# Patient Record
Sex: Male | Born: 1945
Health system: Southern US, Community
[De-identification: ages and names within clinical notes are randomized; demographics above are authoritative.]

## PROBLEM LIST (undated history)

## (undated) DIAGNOSIS — K220 Achalasia of cardia: Secondary | ICD-10-CM

## (undated) DIAGNOSIS — R0600 Dyspnea, unspecified: Secondary | ICD-10-CM

## (undated) DIAGNOSIS — E119 Type 2 diabetes mellitus without complications: Secondary | ICD-10-CM

## (undated) DIAGNOSIS — I4891 Unspecified atrial fibrillation: Secondary | ICD-10-CM

## (undated) DIAGNOSIS — D649 Anemia, unspecified: Secondary | ICD-10-CM

## (undated) DIAGNOSIS — R011 Cardiac murmur, unspecified: Secondary | ICD-10-CM

## (undated) DIAGNOSIS — C4490 Unspecified malignant neoplasm of skin, unspecified: Secondary | ICD-10-CM

## (undated) DIAGNOSIS — I10 Essential (primary) hypertension: Secondary | ICD-10-CM

## (undated) DIAGNOSIS — Z972 Presence of dental prosthetic device (complete) (partial): Secondary | ICD-10-CM

## (undated) DIAGNOSIS — I719 Aortic aneurysm of unspecified site, without rupture: Secondary | ICD-10-CM

## (undated) DIAGNOSIS — E7800 Pure hypercholesterolemia, unspecified: Secondary | ICD-10-CM

## (undated) DIAGNOSIS — N200 Calculus of kidney: Secondary | ICD-10-CM

## (undated) DIAGNOSIS — K219 Gastro-esophageal reflux disease without esophagitis: Secondary | ICD-10-CM

## (undated) DIAGNOSIS — I209 Angina pectoris, unspecified: Secondary | ICD-10-CM

## (undated) DIAGNOSIS — I2510 Atherosclerotic heart disease of native coronary artery without angina pectoris: Secondary | ICD-10-CM

## (undated) DIAGNOSIS — G4730 Sleep apnea, unspecified: Secondary | ICD-10-CM

## (undated) DIAGNOSIS — J439 Emphysema, unspecified: Secondary | ICD-10-CM

## (undated) DIAGNOSIS — J309 Allergic rhinitis, unspecified: Secondary | ICD-10-CM

## (undated) HISTORY — PX: OTHER SURGICAL HISTORY: SHX169

## (undated) HISTORY — PX: HERNIA REPAIR: SHX51

## (undated) HISTORY — PX: CHOLECYSTECTOMY: SHX55

---

## 1965-12-19 HISTORY — PX: NASAL SEPTUM SURGERY: SHX37

## 2006-08-14 ENCOUNTER — Ambulatory Visit: Payer: Self-pay | Admitting: Internal Medicine

## 2006-08-29 ENCOUNTER — Other Ambulatory Visit: Payer: Self-pay

## 2006-08-31 ENCOUNTER — Ambulatory Visit: Payer: Self-pay | Admitting: General Surgery

## 2006-12-07 ENCOUNTER — Ambulatory Visit: Payer: Self-pay | Admitting: Unknown Physician Specialty

## 2007-12-21 ENCOUNTER — Ambulatory Visit: Payer: Self-pay | Admitting: General Surgery

## 2009-12-15 ENCOUNTER — Ambulatory Visit: Payer: Self-pay | Admitting: Urology

## 2010-12-29 ENCOUNTER — Ambulatory Visit: Payer: Self-pay | Admitting: Urology

## 2011-01-01 ENCOUNTER — Inpatient Hospital Stay: Payer: Self-pay | Admitting: Internal Medicine

## 2011-01-03 HISTORY — PX: CORONARY ANGIOPLASTY WITH STENT PLACEMENT: SHX49

## 2013-01-11 ENCOUNTER — Emergency Department: Payer: Self-pay | Admitting: Emergency Medicine

## 2013-01-11 LAB — COMPREHENSIVE METABOLIC PANEL
Alkaline Phosphatase: 139 U/L — ABNORMAL HIGH (ref 50–136)
Anion Gap: 6 — ABNORMAL LOW (ref 7–16)
BUN: 14 mg/dL (ref 7–18)
Bilirubin,Total: 0.5 mg/dL (ref 0.2–1.0)
Creatinine: 1.06 mg/dL (ref 0.60–1.30)
EGFR (African American): >60
Potassium: 3.7 mmol/L (ref 3.5–5.1)
SGPT (ALT): 26 U/L (ref 12–78)
Total Protein: 7.8 g/dL (ref 6.4–8.2)

## 2013-01-11 LAB — CBC
HCT: 50.7 % (ref 40.0–52.0)
HGB: 17.7 g/dL (ref 13.0–18.0)
MCH: 33.6 pg (ref 26.0–34.0)
MCHC: 35 g/dL (ref 32.0–36.0)
Platelet: 129 10*3/uL — ABNORMAL LOW (ref 150–440)
RBC: 5.28 10*6/uL (ref 4.40–5.90)

## 2013-01-11 LAB — TROPONIN I: Troponin-I: <0.02 ng/mL

## 2013-01-12 ENCOUNTER — Emergency Department: Payer: Self-pay | Admitting: Unknown Physician Specialty

## 2013-01-12 LAB — URINALYSIS, COMPLETE
Bacteria: NONE SEEN
Glucose,UR: 50 mg/dL (ref 0–75)
Ketone: NEGATIVE
Nitrite: NEGATIVE
Protein: NEGATIVE
Renal Epithelial: <1
Specific Gravity: 1.009 (ref 1.003–1.030)
Squamous Epithelial: NONE SEEN

## 2013-02-13 ENCOUNTER — Ambulatory Visit: Payer: Self-pay | Admitting: Internal Medicine

## 2013-02-19 ENCOUNTER — Ambulatory Visit: Payer: Self-pay | Admitting: Internal Medicine

## 2014-10-20 DIAGNOSIS — G4733 Obstructive sleep apnea (adult) (pediatric): Secondary | ICD-10-CM | POA: Insufficient documentation

## 2014-10-21 DIAGNOSIS — K219 Gastro-esophageal reflux disease without esophagitis: Secondary | ICD-10-CM | POA: Insufficient documentation

## 2014-10-21 DIAGNOSIS — E119 Type 2 diabetes mellitus without complications: Secondary | ICD-10-CM | POA: Insufficient documentation

## 2015-07-15 ENCOUNTER — Emergency Department: Payer: Commercial Managed Care - HMO

## 2015-07-15 ENCOUNTER — Emergency Department
Admission: EM | Admit: 2015-07-15 | Discharge: 2015-07-15 | Disposition: A | Discharge status: Home / Self Care | Payer: Commercial Managed Care - HMO | Attending: Emergency Medicine | Admitting: Emergency Medicine

## 2015-07-15 ENCOUNTER — Other Ambulatory Visit: Payer: Self-pay

## 2015-07-15 ENCOUNTER — Encounter: Payer: Self-pay | Admitting: Emergency Medicine

## 2015-07-15 DIAGNOSIS — R079 Chest pain, unspecified: Secondary | ICD-10-CM

## 2015-07-15 DIAGNOSIS — I10 Essential (primary) hypertension: Secondary | ICD-10-CM | POA: Diagnosis not present

## 2015-07-15 DIAGNOSIS — E119 Type 2 diabetes mellitus without complications: Secondary | ICD-10-CM | POA: Insufficient documentation

## 2015-07-15 DIAGNOSIS — Z720 Tobacco use: Secondary | ICD-10-CM | POA: Insufficient documentation

## 2015-07-15 DIAGNOSIS — R0789 Other chest pain: Secondary | ICD-10-CM | POA: Diagnosis not present

## 2015-07-15 HISTORY — DX: Unspecified atrial fibrillation: I48.91

## 2015-07-15 HISTORY — DX: Type 2 diabetes mellitus without complications: E11.9

## 2015-07-15 HISTORY — DX: Essential (primary) hypertension: I10

## 2015-07-15 LAB — CBC
HCT: 47.2 % (ref 40.0–52.0)
Hemoglobin: 16.1 g/dL (ref 13.0–18.0)
MCH: 31.8 pg (ref 26.0–34.0)
MCHC: 34.1 g/dL (ref 32.0–36.0)
MCV: 93.4 fL (ref 80.0–100.0)
Platelets: 138 10*3/uL — ABNORMAL LOW (ref 150–440)
RBC: 5.05 MIL/uL (ref 4.40–5.90)
RDW: 13.7 % (ref 11.5–14.5)
WBC: 8.6 10*3/uL (ref 3.8–10.6)

## 2015-07-15 LAB — COMPREHENSIVE METABOLIC PANEL
ALBUMIN: 4.2 g/dL (ref 3.5–5.0)
ALK PHOS: 98 U/L (ref 38–126)
ALT: 18 U/L (ref 17–63)
AST: 27 U/L (ref 15–41)
Anion gap: 8 (ref 5–15)
BUN: 11 mg/dL (ref 6–20)
CALCIUM: 9.7 mg/dL (ref 8.9–10.3)
CO2: 28 mmol/L (ref 22–32)
CREATININE: 0.96 mg/dL (ref 0.61–1.24)
Chloride: 103 mmol/L (ref 101–111)
GFR calc non Af Amer: >60 mL/min (ref >60)
Glucose, Bld: 166 mg/dL — ABNORMAL HIGH (ref 65–99)
POTASSIUM: 3.9 mmol/L (ref 3.5–5.1)
SODIUM: 139 mmol/L (ref 135–145)
Total Bilirubin: 0.6 mg/dL (ref 0.3–1.2)
Total Protein: 7.1 g/dL (ref 6.5–8.1)

## 2015-07-15 LAB — TROPONIN I: Troponin I: <0.03 ng/mL (ref <0.031)

## 2015-07-15 NOTE — ED Provider Notes (Signed)
[facility] Emergency Department Provider Note     Time seen: ----------------------------------------- 3:20 PM on 07/15/2015 -----------------------------------------    I have reviewed the triage vital signs and the nursing notes.   HISTORY  Chief Complaint Chest Pain    HPI Bryan Strong is a 69 y.o. male who presents to ER for intermittent left chest pain for 3 days. Patient states pain is improved at present. Describes as a pressure, nothing made it worse, passing gas seemed to improve the pain. He went to [place] and they sent him here for evaluation. Denied sweats nausea or shortness of breath. Did not feel this way when he received a stent about 6 years ago.   Past Medical History  Diagnosis Date  . Hypertension   . Diabetes mellitus without complication   . A-fib     There are no active problems to display for this patient.   Past Surgical History  Procedure Laterality Date  . Cardiac surgery    . Coronary angioplasty with stent placement      Allergies Other  Social History History  Substance Use Topics  . Smoking status: Current Every Day Smoker    Types: Cigarettes  . Smokeless tobacco: Current User  . Alcohol Use: No    Review of Systems Constitutional: Negative for fever. Eyes: Negative for visual changes. ENT: Negative for sore throat. Cardiovascular: Positive for chest pressure Respiratory: Negative for shortness of breath. Gastrointestinal: Negative for abdominal pain, vomiting and diarrhea. Genitourinary: Negative for dysuria. Musculoskeletal: Negative for back pain. Skin: Negative for rash. Neurological: Negative for headaches, focal weakness or numbness.  10-point ROS otherwise negative.  ____________________________________________   PHYSICAL EXAM:  VITAL SIGNS: ED Triage Vitals  Enc Vitals Group     BP 07/15/15 1354 168/84 mmHg     Pulse Rate 07/15/15 1354 81     Resp 07/15/15 1354 18     Temp  07/15/15 1354 98.5 F (36.9 C)     Temp Source 07/15/15 1354 Oral     SpO2 07/15/15 1354 97 %     Weight 07/15/15 1354 195 lb (88.451 kg)     Height 07/15/15 1354 5\' 10"  (1.778 m)     Head Cir --      Peak Flow --      Pain Score 07/15/15 1355 0     Pain Loc --      Pain Edu? --      Excl. in [place]? --     Constitutional: Alert and oriented. Well appearing and in no distress. Eyes: Conjunctivae are normal. PERRL. Normal extraocular movements. ENT   Head: Normocephalic and atraumatic.   Nose: No congestion/rhinnorhea.   Mouth/Throat: Mucous membranes are moist.   Neck: No stridor. Cardiovascular: Normal rate, regular rhythm. Normal and symmetric distal pulses are present in all extremities. No murmurs, rubs, or gallops. Respiratory: Normal respiratory effort without tachypnea nor retractions. Breath sounds are clear and equal bilaterally. No wheezes/rales/rhonchi. Gastrointestinal: Soft and nontender. No distention. No abdominal bruits. Musculoskeletal: Nontender with normal range of motion in all extremities. No joint effusions.  No lower extremity tenderness nor edema. Neurologic:  Normal speech and language. No gross focal neurologic deficits are appreciated. Speech is normal. No gait instability. Skin:  Skin is warm, dry and intact. No rash noted. Psychiatric: Mood and affect are normal. Speech and behavior are normal. Patient exhibits appropriate insight and judgment. ____________________________________________  EKG: Interpreted by me. Sinus rhythm with normal axis normal intervals. No evidence of  hypertrophy or acute infarction. Rate is 85 bpm  ____________________________________________  ED COURSE:  Pertinent labs & imaging results that were available during my care of the patient were reviewed by me and considered in my medical decision making (see chart for details).  ____________________________________________    LABS (pertinent  positives/negatives)  Labs Reviewed  CBC - Abnormal; Notable for the following:    Platelets 138 (*)    All other components within normal limits  COMPREHENSIVE METABOLIC PANEL - Abnormal; Notable for the following:    Glucose, Bld 166 (*)    All other components within normal limits  TROPONIN I  TROPONIN I    RADIOLOGY Images were viewed by me  Chest x-ray is unremarkable  ____________________________________________  FINAL ASSESSMENT AND PLAN  Chest pressure  Plan: Patient with labs and imaging as dictated above. Troponin is negative 2, we'll referred to cardiology for follow-up.   Earleen Newport, MD   Earleen Newport, MD 07/15/15 [phone]

## 2015-07-15 NOTE — ED Notes (Signed)
Pt from [facility] , x3 days of chest pain / left arm pain

## 2015-07-15 NOTE — Discharge Instructions (Signed)

## 2015-07-15 NOTE — ED Notes (Signed)
C/o intermittent left sided cp x 3 days, pain improved at present

## 2015-07-15 NOTE — ED Notes (Signed)
MD at bedside. 

## 2015-12-29 DIAGNOSIS — I10 Essential (primary) hypertension: Secondary | ICD-10-CM | POA: Diagnosis not present

## 2015-12-29 DIAGNOSIS — H35033 Hypertensive retinopathy, bilateral: Secondary | ICD-10-CM | POA: Diagnosis not present

## 2015-12-29 DIAGNOSIS — H43813 Vitreous degeneration, bilateral: Secondary | ICD-10-CM | POA: Diagnosis not present

## 2015-12-29 DIAGNOSIS — H524 Presbyopia: Secondary | ICD-10-CM | POA: Diagnosis not present

## 2015-12-29 DIAGNOSIS — E119 Type 2 diabetes mellitus without complications: Secondary | ICD-10-CM | POA: Diagnosis not present

## 2015-12-29 DIAGNOSIS — H2513 Age-related nuclear cataract, bilateral: Secondary | ICD-10-CM | POA: Diagnosis not present

## 2015-12-29 DIAGNOSIS — H5213 Myopia, bilateral: Secondary | ICD-10-CM | POA: Diagnosis not present

## 2015-12-29 DIAGNOSIS — H1045 Other chronic allergic conjunctivitis: Secondary | ICD-10-CM | POA: Diagnosis not present

## 2015-12-29 DIAGNOSIS — H52223 Regular astigmatism, bilateral: Secondary | ICD-10-CM | POA: Diagnosis not present

## 2016-01-15 DIAGNOSIS — H43813 Vitreous degeneration, bilateral: Secondary | ICD-10-CM | POA: Diagnosis not present

## 2016-01-15 DIAGNOSIS — S0500XA Injury of conjunctiva and corneal abrasion without foreign body, unspecified eye, initial encounter: Secondary | ICD-10-CM | POA: Diagnosis not present

## 2016-01-15 DIAGNOSIS — I10 Essential (primary) hypertension: Secondary | ICD-10-CM | POA: Diagnosis not present

## 2016-01-15 DIAGNOSIS — H35033 Hypertensive retinopathy, bilateral: Secondary | ICD-10-CM | POA: Diagnosis not present

## 2016-01-15 DIAGNOSIS — H52223 Regular astigmatism, bilateral: Secondary | ICD-10-CM | POA: Diagnosis not present

## 2016-01-15 DIAGNOSIS — H524 Presbyopia: Secondary | ICD-10-CM | POA: Diagnosis not present

## 2016-01-15 DIAGNOSIS — H5213 Myopia, bilateral: Secondary | ICD-10-CM | POA: Diagnosis not present

## 2016-01-15 DIAGNOSIS — H2513 Age-related nuclear cataract, bilateral: Secondary | ICD-10-CM | POA: Diagnosis not present

## 2016-01-15 DIAGNOSIS — E119 Type 2 diabetes mellitus without complications: Secondary | ICD-10-CM | POA: Diagnosis not present

## 2016-02-01 DIAGNOSIS — G4733 Obstructive sleep apnea (adult) (pediatric): Secondary | ICD-10-CM | POA: Diagnosis not present

## 2016-02-03 DIAGNOSIS — H5713 Ocular pain, bilateral: Secondary | ICD-10-CM | POA: Diagnosis not present

## 2016-02-03 DIAGNOSIS — H16223 Keratoconjunctivitis sicca, not specified as Sjogren's, bilateral: Secondary | ICD-10-CM | POA: Diagnosis not present

## 2016-02-17 DIAGNOSIS — H5713 Ocular pain, bilateral: Secondary | ICD-10-CM | POA: Diagnosis not present

## 2016-02-22 DIAGNOSIS — I77811 Abdominal aortic ectasia: Secondary | ICD-10-CM | POA: Diagnosis not present

## 2016-02-22 DIAGNOSIS — I714 Abdominal aortic aneurysm, without rupture: Secondary | ICD-10-CM | POA: Diagnosis not present

## 2016-02-22 DIAGNOSIS — E119 Type 2 diabetes mellitus without complications: Secondary | ICD-10-CM | POA: Diagnosis not present

## 2016-02-22 DIAGNOSIS — E785 Hyperlipidemia, unspecified: Secondary | ICD-10-CM | POA: Diagnosis not present

## 2016-02-22 DIAGNOSIS — I7300 Raynaud's syndrome without gangrene: Secondary | ICD-10-CM | POA: Diagnosis not present

## 2016-02-22 DIAGNOSIS — I10 Essential (primary) hypertension: Secondary | ICD-10-CM | POA: Diagnosis not present

## 2016-02-22 DIAGNOSIS — I499 Cardiac arrhythmia, unspecified: Secondary | ICD-10-CM | POA: Diagnosis not present

## 2016-02-22 DIAGNOSIS — E669 Obesity, unspecified: Secondary | ICD-10-CM | POA: Diagnosis not present

## 2016-03-03 DIAGNOSIS — H0259 Other disorders affecting eyelid function: Secondary | ICD-10-CM | POA: Diagnosis not present

## 2016-03-07 DIAGNOSIS — L718 Other rosacea: Secondary | ICD-10-CM | POA: Diagnosis not present

## 2016-03-07 DIAGNOSIS — H04223 Epiphora due to insufficient drainage, bilateral lacrimal glands: Secondary | ICD-10-CM | POA: Diagnosis not present

## 2016-03-07 DIAGNOSIS — H0289 Other specified disorders of eyelid: Secondary | ICD-10-CM | POA: Diagnosis not present

## 2016-03-24 DIAGNOSIS — H2513 Age-related nuclear cataract, bilateral: Secondary | ICD-10-CM | POA: Diagnosis not present

## 2016-03-24 DIAGNOSIS — H02102 Unspecified ectropion of right lower eyelid: Secondary | ICD-10-CM | POA: Diagnosis not present

## 2016-03-24 DIAGNOSIS — H04123 Dry eye syndrome of bilateral lacrimal glands: Secondary | ICD-10-CM | POA: Diagnosis not present

## 2016-03-24 DIAGNOSIS — I10 Essential (primary) hypertension: Secondary | ICD-10-CM | POA: Diagnosis not present

## 2016-03-24 DIAGNOSIS — E119 Type 2 diabetes mellitus without complications: Secondary | ICD-10-CM | POA: Diagnosis not present

## 2016-03-24 DIAGNOSIS — H524 Presbyopia: Secondary | ICD-10-CM | POA: Diagnosis not present

## 2016-03-24 DIAGNOSIS — H5213 Myopia, bilateral: Secondary | ICD-10-CM | POA: Diagnosis not present

## 2016-03-24 DIAGNOSIS — H43813 Vitreous degeneration, bilateral: Secondary | ICD-10-CM | POA: Diagnosis not present

## 2016-03-24 DIAGNOSIS — H52223 Regular astigmatism, bilateral: Secondary | ICD-10-CM | POA: Diagnosis not present

## 2016-03-24 DIAGNOSIS — H02105 Unspecified ectropion of left lower eyelid: Secondary | ICD-10-CM | POA: Diagnosis not present

## 2016-04-07 DIAGNOSIS — E119 Type 2 diabetes mellitus without complications: Secondary | ICD-10-CM | POA: Diagnosis not present

## 2016-04-07 DIAGNOSIS — B351 Tinea unguium: Secondary | ICD-10-CM | POA: Diagnosis not present

## 2016-04-14 DIAGNOSIS — H02132 Senile ectropion of right lower eyelid: Secondary | ICD-10-CM | POA: Diagnosis not present

## 2016-04-14 DIAGNOSIS — H02135 Senile ectropion of left lower eyelid: Secondary | ICD-10-CM | POA: Diagnosis not present

## 2016-04-27 ENCOUNTER — Encounter: Payer: Self-pay | Admitting: *Deleted

## 2016-04-28 NOTE — Discharge Instructions (Signed)
INSTRUCTIONS FOLLOWING OCULOPLASTIC SURGERY °AMY M. FOWLER, MD ° °AFTER YOUR EYE SURGERY, THER ARE MANY THINGS THWIHC YOU, THE PATIENT, CAN DO TO ASSURE THE BEST POSSIBLE RESULT FROM YOUR OPERATION.  THIS SHEET SHOULD BE REFERRED TO WHENEVER QUESTIONS ARISE.  IF THERE ARE ANY QUESTIONS NOT ANSWERED HERE, DO NOT HESITATE TO CALL OUR OFFICE AT [phone] OR [phone].  THERE IS ALWAYS [facility] AVAILABLE TO CALL IF QUESTIONS OR PROBLEMS ARISE. ° °VISION: Your vision may be blurred and out of focus after surgery until you are able to stop using your ointment, swelling resolves and your eye(s) heal. This may take 1 to 2 weeks at the least.  If your vision becomes gradually more dim or dark, this is not normal and you need to call our office immediately. ° °EYE CARE: For the first 48 hours after surgery, use ice packs frequently - “20 minutes on, 20 minutes off” - to help reduce swelling and bruising.  Small bags of frozen peas or corn make good ice packs along with cloths soaked in ice water.  If you are wearing a patch or other type of dressing following surgery, keep this on for the amount of time specified by your doctor.  For the first week following surgery, you will need to treat your stitches with great care.  If is OK to shower, but take care to not allow soapy water to run into your eye(s) to help reduce changes of infection.  You may gently clean the eyelashes and around the eye(s) with cotton balls and sterile water, BUT DO NOT RUB THE STITCHES VIGOROUSLY.  Keeping your stitches moist with ointment will help promote healing with minimal scar formation. ° °ACTIVITY: When you leave the surgery center, you should go home, rest and be inactive.  The eye(s) may feel scratchy and keeping the eyes closed will allow for faster healing.  The first week following surgery, avoid straining (anything making the face turn red) or lifting over 20 pounds.  Additionally, avoid bending which causes your head to go below  your waist.  Using your eyes will NOT harm them, so feel free to read, watch television, use the computer, etc as desired.  Driving depends on each individual, so check with your doctor if you have questions about driving. ° °MEDICATIONS:  You will be given a prescription for an ointment to use 4 times a day on your stitches.  You can use the ointment in your eyes if they feel scratchy or irritated.  If you eyelid(s) Yama’t close completely when you sleep, put some ointment in your eyes before bedtime. ° °EMERGENCY: If you experience SEVERE EYE PAIN OR HEADACHE UNRELIEVED BY TYLENOL OR PERCOCET, NAUSEA OR VOMITING, WORSENING REDNESS, OR WORSENING VISION (ESPECIALLY VISION THAT WA INITIALLY BETTER) CALL [phone] OR [phone] DURING BUSINESS HOURS OR AFTER HOURS. ° °General Anesthesia, Adult, Care After °Refer to this sheet in the next few weeks. These instructions provide you with information on caring for yourself after your procedure. Your health care provider may also give you more specific instructions. Your treatment has been planned according to current medical practices, but problems sometimes occur. Call your health care provider if you have any problems or questions after your procedure. °WHAT TO EXPECT AFTER THE PROCEDURE °After the procedure, it is typical to experience: °· Sleepiness. °· Nausea and vomiting. °HOME CARE INSTRUCTIONS °· For the first 24 hours after general anesthesia: °¨ Have a responsible person with you. °¨ Do not drive a car. If you   are alone, do not take public transportation. °¨ Do not drink alcohol. °¨ Do not take medicine that has not been prescribed by your health care provider. °¨ Do not sign important papers or make important decisions. °¨ You may resume a normal diet and activities as directed by your health care provider. °· Change bandages (dressings) as directed. °· If you have questions or problems that seem related to general anesthesia, call the hospital and ask for  the anesthetist or anesthesiologist on call. °SEEK MEDICAL CARE IF: °· You have nausea and vomiting that continue the day after anesthesia. °· You develop a rash. °SEEK IMMEDIATE MEDICAL CARE IF:  °· You have difficulty breathing. °· You have chest pain. °· You have any allergic problems. °  °This information is not intended to replace advice given to you by your health care provider. Make sure you discuss any questions you have with your health care provider. °  °Document Released: 03/13/2001 Document Revised: 12/26/2014 Document Reviewed: 04/04/2012 °Elsevier Interactive Patient Education ©2016 [employer] ° °

## 2016-05-03 ENCOUNTER — Ambulatory Visit: Payer: PPO | Admitting: Anesthesiology

## 2016-05-03 ENCOUNTER — Ambulatory Visit
Admission: RE | Admit: 2016-05-03 | Discharge: 2016-05-03 | Disposition: A | Discharge status: Home / Self Care | Payer: PPO | Source: Ambulatory Visit | Attending: Ophthalmology | Admitting: Ophthalmology

## 2016-05-03 ENCOUNTER — Encounter
Admission: RE | Disposition: A | Discharge status: Home / Self Care | Payer: Self-pay | Source: Ambulatory Visit | Attending: Ophthalmology

## 2016-05-03 DIAGNOSIS — K219 Gastro-esophageal reflux disease without esophagitis: Secondary | ICD-10-CM | POA: Insufficient documentation

## 2016-05-03 DIAGNOSIS — L728 Other follicular cysts of the skin and subcutaneous tissue: Secondary | ICD-10-CM | POA: Diagnosis not present

## 2016-05-03 DIAGNOSIS — H02821 Cysts of right upper eyelid: Secondary | ICD-10-CM | POA: Diagnosis not present

## 2016-05-03 DIAGNOSIS — I10 Essential (primary) hypertension: Secondary | ICD-10-CM | POA: Diagnosis not present

## 2016-05-03 DIAGNOSIS — I719 Aortic aneurysm of unspecified site, without rupture: Secondary | ICD-10-CM | POA: Diagnosis not present

## 2016-05-03 DIAGNOSIS — Z87891 Personal history of nicotine dependence: Secondary | ICD-10-CM | POA: Diagnosis not present

## 2016-05-03 DIAGNOSIS — I4891 Unspecified atrial fibrillation: Secondary | ICD-10-CM | POA: Diagnosis not present

## 2016-05-03 DIAGNOSIS — R0681 Apnea, not elsewhere classified: Secondary | ICD-10-CM | POA: Diagnosis not present

## 2016-05-03 DIAGNOSIS — L239 Allergic contact dermatitis, unspecified cause: Secondary | ICD-10-CM | POA: Insufficient documentation

## 2016-05-03 DIAGNOSIS — Z85828 Personal history of other malignant neoplasm of skin: Secondary | ICD-10-CM | POA: Insufficient documentation

## 2016-05-03 DIAGNOSIS — E7800 Pure hypercholesterolemia, unspecified: Secondary | ICD-10-CM | POA: Insufficient documentation

## 2016-05-03 DIAGNOSIS — J449 Chronic obstructive pulmonary disease, unspecified: Secondary | ICD-10-CM | POA: Diagnosis not present

## 2016-05-03 DIAGNOSIS — Z9049 Acquired absence of other specified parts of digestive tract: Secondary | ICD-10-CM | POA: Diagnosis not present

## 2016-05-03 DIAGNOSIS — H02102 Unspecified ectropion of right lower eyelid: Secondary | ICD-10-CM | POA: Insufficient documentation

## 2016-05-03 DIAGNOSIS — H02105 Unspecified ectropion of left lower eyelid: Secondary | ICD-10-CM | POA: Diagnosis not present

## 2016-05-03 DIAGNOSIS — H02132 Senile ectropion of right lower eyelid: Secondary | ICD-10-CM | POA: Diagnosis not present

## 2016-05-03 DIAGNOSIS — E119 Type 2 diabetes mellitus without complications: Secondary | ICD-10-CM | POA: Diagnosis not present

## 2016-05-03 DIAGNOSIS — I2510 Atherosclerotic heart disease of native coronary artery without angina pectoris: Secondary | ICD-10-CM | POA: Insufficient documentation

## 2016-05-03 DIAGNOSIS — Z87442 Personal history of urinary calculi: Secondary | ICD-10-CM | POA: Diagnosis not present

## 2016-05-03 DIAGNOSIS — Z955 Presence of coronary angioplasty implant and graft: Secondary | ICD-10-CM | POA: Insufficient documentation

## 2016-05-03 DIAGNOSIS — H01112 Allergic dermatitis of right lower eyelid: Secondary | ICD-10-CM | POA: Diagnosis not present

## 2016-05-03 DIAGNOSIS — H02135 Senile ectropion of left lower eyelid: Secondary | ICD-10-CM | POA: Diagnosis not present

## 2016-05-03 DIAGNOSIS — H01115 Allergic dermatitis of left lower eyelid: Secondary | ICD-10-CM | POA: Diagnosis not present

## 2016-05-03 HISTORY — DX: Gastro-esophageal reflux disease without esophagitis: K21.9

## 2016-05-03 HISTORY — DX: Sleep apnea, unspecified: G47.30

## 2016-05-03 HISTORY — PX: LID LESION EXCISION: SHX5204

## 2016-05-03 HISTORY — DX: Calculus of kidney: N20.0

## 2016-05-03 HISTORY — DX: Pure hypercholesterolemia, unspecified: E78.00

## 2016-05-03 HISTORY — DX: Unspecified malignant neoplasm of skin, unspecified: C44.90

## 2016-05-03 HISTORY — DX: Presence of dental prosthetic device (complete) (partial): Z97.2

## 2016-05-03 HISTORY — DX: Emphysema, unspecified: J43.9

## 2016-05-03 HISTORY — PX: ECTROPION REPAIR: SHX357

## 2016-05-03 HISTORY — DX: Aortic aneurysm of unspecified site, without rupture: I71.9

## 2016-05-03 LAB — GLUCOSE, CAPILLARY
GLUCOSE-CAPILLARY: 176 mg/dL — AB (ref 65–99)
Glucose-Capillary: 162 mg/dL — ABNORMAL HIGH (ref 65–99)

## 2016-05-03 SURGERY — REPAIR, ECTROPION, EYELID
Anesthesia: Monitor Anesthesia Care | Site: Eye | Laterality: Bilateral | Wound class: Clean

## 2016-05-03 MED ORDER — LACTATED RINGERS IV SOLN
INTRAVENOUS | Status: DC
  Administered 2016-05-03 (×2): via INTRAVENOUS

## 2016-05-03 MED ORDER — BSS IO SOLN
INTRAOCULAR | Status: DC | PRN
  Administered 2016-05-03: 15 mL via INTRAOCULAR

## 2016-05-03 MED ORDER — OXYCODONE-ACETAMINOPHEN 5-325 MG PO TABS: 1.0000 | ORAL_TABLET | ORAL | Status: DC | PRN

## 2016-05-03 MED ORDER — NEOMYCIN-POLYMYXIN-DEXAMETH 3.5-10000-0.1 OP OINT
TOPICAL_OINTMENT | OPHTHALMIC | Status: DC | PRN
  Administered 2016-05-03: 1 via OPHTHALMIC

## 2016-05-03 MED ORDER — PROPOFOL 500 MG/50ML IV EMUL
INTRAVENOUS | Status: DC | PRN
  Administered 2016-05-03: 30 ug/kg/min via INTRAVENOUS

## 2016-05-03 MED ORDER — TETRACAINE HCL 0.5 % OP SOLN
OPHTHALMIC | Status: DC | PRN
  Administered 2016-05-03: 2 [drp] via OPHTHALMIC

## 2016-05-03 MED ORDER — ERYTHROMYCIN 5 MG/GM OP OINT: TOPICAL_OINTMENT | OPHTHALMIC | Status: DC

## 2016-05-03 MED ORDER — ALFENTANIL 500 MCG/ML IJ INJ
INJECTION | INTRAMUSCULAR | Status: DC | PRN
  Administered 2016-05-03: 700 ug via INTRAVENOUS
  Administered 2016-05-03: 300 ug via INTRAVENOUS

## 2016-05-03 MED ORDER — LABETALOL HCL 5 MG/ML IV SOLN
10.0000 mg | Freq: Once | INTRAVENOUS | Status: AC
  Administered 2016-05-03: 10 mg via INTRAVENOUS

## 2016-05-03 MED ORDER — LIDOCAINE-EPINEPHRINE 2 %-1:100000 IJ SOLN
INTRAMUSCULAR | Status: DC | PRN
  Administered 2016-05-03: 4 mL via OPHTHALMIC

## 2016-05-03 SURGICAL SUPPLY — 36 items
APPLICATOR COTTON TIP WD 3 STR (MISCELLANEOUS) ×8 IMPLANT
BLADE SURG 15 STRL LF DISP TIS (BLADE) ×2 IMPLANT
BLADE SURG 15 STRL SS (BLADE) ×2
CORD BIP STRL DISP 12FT (MISCELLANEOUS) ×4 IMPLANT
DRAPE HEAD BAR (DRAPES) ×4 IMPLANT
GAUZE SPONGE 4X4 12PLY STRL (GAUZE/BANDAGES/DRESSINGS) ×4 IMPLANT
GAUZE SPONGE NON-WVN 2X2 STRL (MISCELLANEOUS) ×20 IMPLANT
GLOVE SURG LX 7.0 MICRO (GLOVE) ×4
GLOVE SURG LX STRL 7.0 MICRO (GLOVE) ×4 IMPLANT
MARKER SKIN XFINE TIP W/RULER (MISCELLANEOUS) ×4 IMPLANT
NEEDLE FILTER BLUNT 18X 1/2SAF (NEEDLE) ×2
NEEDLE FILTER BLUNT 18X1 1/2 (NEEDLE) ×2 IMPLANT
NEEDLE HYPO 30X.5 LL (NEEDLE) ×8 IMPLANT
PACK DRAPE NASAL/ENT (PACKS) ×4 IMPLANT
SOL PREP PVP 2OZ (MISCELLANEOUS) ×4
SOLUTION PREP PVP 2OZ (MISCELLANEOUS) ×2 IMPLANT
SPONGE VERSALON 2X2 STRL (MISCELLANEOUS) ×20
SUT CHROMIC 4-0 (SUTURE)
SUT CHROMIC 4-0 M2 12X2 ARM (SUTURE)
SUT CHROMIC 5 0 P 3 (SUTURE) IMPLANT
SUT ETHILON 4 0 CL P 3 (SUTURE) IMPLANT
SUT MERSILENE 4-0 S-2 (SUTURE) ×8 IMPLANT
SUT PDS AB 4-0 P3 18 (SUTURE) IMPLANT
SUT PLAIN GUT (SUTURE) ×4 IMPLANT
SUT PROLENE 5 0 P 3 (SUTURE) ×4 IMPLANT
SUT PROLENE 6 0 P 1 18 (SUTURE) IMPLANT
SUT SILK 4 0 G 3 (SUTURE) IMPLANT
SUT VIC AB 5-0 P-3 18X BRD (SUTURE) IMPLANT
SUT VIC AB 5-0 P3 18 (SUTURE)
SUT VICRYL 6-0  S14 CTD (SUTURE)
SUT VICRYL 6-0 S14 CTD (SUTURE) IMPLANT
SUT VICRYL 7 0 TG140 8 (SUTURE) IMPLANT
SUTURE CHRMC 4-0 M2 12X2 ARM (SUTURE) IMPLANT
SYR 3ML LL SCALE MARK (SYRINGE) ×4 IMPLANT
SYRINGE 10CC LL (SYRINGE) ×4 IMPLANT
WATER STERILE IRR 500ML POUR (IV SOLUTION) ×4 IMPLANT

## 2016-05-03 NOTE — Anesthesia Preprocedure Evaluation (Addendum)
Anesthesia Evaluation  Patient identified by MRN, date of birth, ID band Patient awake    Reviewed: Allergy & Precautions, NPO status , Patient's Chart, lab work & pertinent test results  Airway Mallampati: I  TM Distance: >3 FB Neck ROM: Full    Dental  (+) Upper Dentures, Lower Dentures   Pulmonary sleep apnea and Continuous Positive Airway Pressure Ventilation , COPD, former smoker,    Pulmonary exam normal        Cardiovascular hypertension, + CAD and + Cardiac Stents  Normal cardiovascular exam+ dysrhythmias Atrial Fibrillation      Neuro/Psych negative neurological ROS  negative psych ROS   GI/Hepatic GERD  Medicated and Controlled,  Endo/Other  diabetes, Oral Hypoglycemic Agents  Renal/GU Renal disease     Musculoskeletal negative musculoskeletal ROS (+)   Abdominal   Peds  Hematology negative hematology ROS (+)   Anesthesia Other Findings   Reproductive/Obstetrics negative OB ROS                            Anesthesia Physical Anesthesia Plan  ASA: III  Anesthesia Plan: MAC   Post-op Pain Management:    Induction: Intravenous  Airway Management Planned:   Additional Equipment:   Intra-op Plan:   Post-operative Plan:   Informed Consent: I have reviewed the patients History and Physical, chart, labs and discussed the procedure including the risks, benefits and alternatives for the proposed anesthesia with the patient or authorized representative who has indicated his/her understanding and acceptance.     Plan Discussed with: CRNA  Anesthesia Plan Comments:         Anesthesia Quick Evaluation

## 2016-05-03 NOTE — Interval H&P Note (Signed)
History and Physical Interval Note:  05/03/2016 9:50 AM  Bryan Strong  has presented today for surgery, with the diagnosis of Alpha H02.132 RIGHT LOWER H02.135 LEFT LOWER ALLERGIC DERMATITIS OF EYELID H01.112 RIGHT LOWER H01.115 LEFT LOWER  The various methods of treatment have been discussed with the patient and family. After consideration of risks, benefits and other options for treatment, the patient has consented to  Procedure(s) with comments: REPAIR OF ECTROPION (Bilateral) BIOPSY EYELIDS LOWER (Bilateral) - Diabetic - oral meds CPAP as a surgical intervention .  The patient's history has been reviewed, patient examined, no change in status, stable for surgery.  I have reviewed the patient's chart and labs.  Questions were answered to the patient's satisfaction.     Vickki Muff, Aliana Kreischer M

## 2016-05-03 NOTE — Op Note (Signed)
Preoperative Diagnosis:   1.  Lower eyelid laxity with ectropion, bilateral  lower eyelid(s). 2.  Lesion right upper eyelid  Postoperative Diagnosis:   Same.  Procedure(s) Performed:  1.  Lateral tarsal strip procedure,  bilateral  lower eyelid(s). 2.  Biopsy right upper eyelid lesion   Teaching Surgeon: Philis Pique. Vickki Muff, M.D.  Assistants: none  Anesthesia: MAC  Specimens: 4 mm yellow domed lesion sent in formalin   Estimated Blood Loss: Minimal.  Complications: None.  Operative Findings: None   Procedure:    Review of patient's allergies indicates no known allergies..    After discussing the risks, benefits, complications, and alternatives with the patient, appropriate informed consent was obtained. The patient was brought to the operating suite and reclined supine. Time out was conducted and the patient was sedated.  Local anesthetic consisting of a 50-50 mixture of 2% lidocaine and 0.75% bupivacaine with added Hylenex was injected subcutaneously to the bilateral lateral canthal region(s) and lower eyelid(s). Additional anesthetic was injected subcutaneously to the right upper eyelid and subconjunctivally to the eye lateral lower eyelid(s). Finally, anesthetic was injected down to the periosteum of the bilateral lateral orbital rim(s).  After adequate local was instilled, the patient was prepped and draped in the usual sterile fashion for eyelid surgery. Attention was turned to the right lateral canthal angle. Westcott scissors were used to create a lateral canthotomy. Hemostasis was obtained with bipolar cautery. An inferior cantholysis was then performed with additional bipolar hemostasis. The anterior and posterior lamella of the lid were divided for approximately 8 mm.  A strip of the epithelium was excised off the superior margin of the tarsal strip and conjunctiva and retractors were incised off the inferior margin of the tarsal strip. A double-armed 4-0 Mersilene suture was then  passed each arm through the terminal portion of the tarsal strip. Each arm of the suture was then passed through the periosteum of the inner portion of the lateral orbital rim at the level of Whitnall's tubercle. The sutures were advanced and this provided nice elevation and tightening of the lower eyelid. Once the suture was secured, a thin strip of follicle-bearing skin was excised. The lateral canthal angle was reformed with an interrupted 6-0 fast absorbing plain suture. Orbicularis was reapproximated with horizontal subcuticular 6-0 fast absorbing plain gut sutures. The skin was closed with interrupted 6-0 fast absorbing plain gut sutures.   Attention was then turned to the opposite eyelid where the same procedure was performed in the same manner.   Attention was turned to the right upper eyelid. An ellipse was incised around the lesion with a #15 blade. The lesion was excised completely with Wescott scissors and placed in formalin. An interrupted 6-0 fast absorbing plain gut suture was placed to reapproximate the skin edges.  The patient tolerated the procedure well. Erythromycin ophthalmic ointment was applied to the incision site(s) followed by ice packs. The patient was taken to the recovery area where he recovered without difficulty.  Post-Op Plan/Instructions:  The patient was instructed to use ice packs frequently for the next 48 hours. He was instructed to use erythromycin ophthalmic ointment on his incisions 4 times a day for the next 12 to 14 days. He was given a prescription for Percocet for pain control should Tylenol not be effective. He was asked to to follow up at the [facility] in [place], [place] in 3-4 weeks' time or sooner as needed for problems.  Teaching Surgeon Attestation: None  Jermichael Belmares M. Vickki Muff, M.D.  Attending,Ophthalmology

## 2016-05-03 NOTE — Transfer of Care (Signed)
Immediate Anesthesia Transfer of Care Note  Patient: Bryan Strong  Procedure(s) Performed: Procedure(s) with comments: REPAIR OF ECTROPION BILATERAL LOWER EYELIDS (Bilateral) ,EXCISION CYST RIGHT UPPER EYELID (Bilateral) - Diabetic - oral meds CPAP  Patient Location: PACU  Anesthesia Type: MAC  Level of Consciousness: awake, alert  and patient cooperative  Airway and Oxygen Therapy: Patient Spontanous Breathing and Patient connected to supplemental oxygen  Post-op Assessment: Post-op Vital signs reviewed, Patient's Cardiovascular Status Stable, Respiratory Function Stable, Patent Airway and No signs of Nausea or vomiting  Post-op Vital Signs: Reviewed and stable  Complications: No apparent anesthesia complications

## 2016-05-03 NOTE — Anesthesia Procedure Notes (Signed)
Procedure Name: MAC Performed by: Dayveon Halley Pre-anesthesia Checklist: Patient identified, Emergency Drugs available, Suction available, Timeout performed and Patient being monitored Patient Re-evaluated:Patient Re-evaluated prior to inductionOxygen Delivery Method: Nasal cannula Placement Confirmation: positive ETCO2     

## 2016-05-03 NOTE — H&P (Signed)
  See the history and physical completed at [facility] on 04/21/2016 and scanned into the chart

## 2016-05-03 NOTE — Anesthesia Postprocedure Evaluation (Signed)
Anesthesia Post Note  Patient: Bryan Strong  Procedure(s) Performed: Procedure(s) (LRB): REPAIR OF ECTROPION BILATERAL LOWER EYELIDS (Bilateral) ,EXCISION CYST RIGHT UPPER EYELID (Bilateral)  Patient location during evaluation: PACU Anesthesia Type: MAC Level of consciousness: awake and alert and oriented Pain management: pain level controlled Vital Signs Assessment: post-procedure vital signs reviewed and stable Respiratory status: spontaneous breathing and nonlabored ventilation Cardiovascular status: stable Postop Assessment: no signs of nausea or vomiting and adequate PO intake Anesthetic complications: no    Estill Batten

## 2016-05-04 ENCOUNTER — Encounter: Payer: Self-pay | Admitting: Ophthalmology

## 2016-05-06 LAB — SURGICAL PATHOLOGY

## 2016-05-19 DIAGNOSIS — J310 Chronic rhinitis: Secondary | ICD-10-CM | POA: Diagnosis not present

## 2016-05-19 DIAGNOSIS — J449 Chronic obstructive pulmonary disease, unspecified: Secondary | ICD-10-CM | POA: Diagnosis not present

## 2016-05-19 DIAGNOSIS — G4733 Obstructive sleep apnea (adult) (pediatric): Secondary | ICD-10-CM | POA: Diagnosis not present

## 2016-05-19 DIAGNOSIS — R0609 Other forms of dyspnea: Secondary | ICD-10-CM | POA: Diagnosis not present

## 2016-05-20 DIAGNOSIS — E119 Type 2 diabetes mellitus without complications: Secondary | ICD-10-CM | POA: Diagnosis not present

## 2016-05-20 DIAGNOSIS — Z79899 Other long term (current) drug therapy: Secondary | ICD-10-CM | POA: Diagnosis not present

## 2016-05-20 DIAGNOSIS — E7800 Pure hypercholesterolemia, unspecified: Secondary | ICD-10-CM | POA: Diagnosis not present

## 2016-05-27 DIAGNOSIS — I2510 Atherosclerotic heart disease of native coronary artery without angina pectoris: Secondary | ICD-10-CM | POA: Insufficient documentation

## 2016-05-27 DIAGNOSIS — Z79899 Other long term (current) drug therapy: Secondary | ICD-10-CM | POA: Diagnosis not present

## 2016-05-27 DIAGNOSIS — K219 Gastro-esophageal reflux disease without esophagitis: Secondary | ICD-10-CM | POA: Diagnosis not present

## 2016-05-27 DIAGNOSIS — I10 Essential (primary) hypertension: Secondary | ICD-10-CM | POA: Diagnosis not present

## 2016-05-27 DIAGNOSIS — J449 Chronic obstructive pulmonary disease, unspecified: Secondary | ICD-10-CM | POA: Diagnosis not present

## 2016-05-27 DIAGNOSIS — Z125 Encounter for screening for malignant neoplasm of prostate: Secondary | ICD-10-CM | POA: Diagnosis not present

## 2016-05-27 DIAGNOSIS — E119 Type 2 diabetes mellitus without complications: Secondary | ICD-10-CM | POA: Diagnosis not present

## 2016-05-27 DIAGNOSIS — E7800 Pure hypercholesterolemia, unspecified: Secondary | ICD-10-CM | POA: Diagnosis not present

## 2016-05-27 DIAGNOSIS — Z9989 Dependence on other enabling machines and devices: Secondary | ICD-10-CM | POA: Diagnosis not present

## 2016-05-27 DIAGNOSIS — G4733 Obstructive sleep apnea (adult) (pediatric): Secondary | ICD-10-CM | POA: Diagnosis not present

## 2016-06-30 DIAGNOSIS — H119 Unspecified disorder of conjunctiva: Secondary | ICD-10-CM | POA: Diagnosis not present

## 2016-07-12 DIAGNOSIS — B351 Tinea unguium: Secondary | ICD-10-CM | POA: Diagnosis not present

## 2016-07-12 DIAGNOSIS — E119 Type 2 diabetes mellitus without complications: Secondary | ICD-10-CM | POA: Diagnosis not present

## 2016-07-28 DIAGNOSIS — H04569 Stenosis of unspecified lacrimal punctum: Secondary | ICD-10-CM | POA: Diagnosis not present

## 2016-09-29 DIAGNOSIS — E119 Type 2 diabetes mellitus without complications: Secondary | ICD-10-CM | POA: Diagnosis not present

## 2016-09-29 DIAGNOSIS — H0289 Other specified disorders of eyelid: Secondary | ICD-10-CM | POA: Diagnosis not present

## 2016-09-29 DIAGNOSIS — H2513 Age-related nuclear cataract, bilateral: Secondary | ICD-10-CM | POA: Diagnosis not present

## 2016-09-29 DIAGNOSIS — H04123 Dry eye syndrome of bilateral lacrimal glands: Secondary | ICD-10-CM | POA: Diagnosis not present

## 2016-09-29 DIAGNOSIS — H52223 Regular astigmatism, bilateral: Secondary | ICD-10-CM | POA: Diagnosis not present

## 2016-09-29 DIAGNOSIS — H5213 Myopia, bilateral: Secondary | ICD-10-CM | POA: Diagnosis not present

## 2016-09-29 DIAGNOSIS — I10 Essential (primary) hypertension: Secondary | ICD-10-CM | POA: Diagnosis not present

## 2016-09-29 DIAGNOSIS — H43813 Vitreous degeneration, bilateral: Secondary | ICD-10-CM | POA: Diagnosis not present

## 2016-10-05 DIAGNOSIS — Z23 Encounter for immunization: Secondary | ICD-10-CM | POA: Diagnosis not present

## 2016-10-13 DIAGNOSIS — H02834 Dermatochalasis of left upper eyelid: Secondary | ICD-10-CM | POA: Diagnosis not present

## 2016-10-13 DIAGNOSIS — H02831 Dermatochalasis of right upper eyelid: Secondary | ICD-10-CM | POA: Diagnosis not present

## 2016-10-18 DIAGNOSIS — B351 Tinea unguium: Secondary | ICD-10-CM | POA: Diagnosis not present

## 2016-10-18 DIAGNOSIS — E119 Type 2 diabetes mellitus without complications: Secondary | ICD-10-CM | POA: Diagnosis not present

## 2016-11-23 DIAGNOSIS — R05 Cough: Secondary | ICD-10-CM | POA: Diagnosis not present

## 2016-11-23 DIAGNOSIS — J310 Chronic rhinitis: Secondary | ICD-10-CM | POA: Diagnosis not present

## 2016-11-23 DIAGNOSIS — J449 Chronic obstructive pulmonary disease, unspecified: Secondary | ICD-10-CM | POA: Diagnosis not present

## 2016-11-23 DIAGNOSIS — G4733 Obstructive sleep apnea (adult) (pediatric): Secondary | ICD-10-CM | POA: Diagnosis not present

## 2016-11-25 DIAGNOSIS — Z79899 Other long term (current) drug therapy: Secondary | ICD-10-CM | POA: Diagnosis not present

## 2016-11-25 DIAGNOSIS — Z125 Encounter for screening for malignant neoplasm of prostate: Secondary | ICD-10-CM | POA: Diagnosis not present

## 2016-11-25 DIAGNOSIS — E119 Type 2 diabetes mellitus without complications: Secondary | ICD-10-CM | POA: Diagnosis not present

## 2016-11-25 DIAGNOSIS — E7800 Pure hypercholesterolemia, unspecified: Secondary | ICD-10-CM | POA: Diagnosis not present

## 2016-12-02 DIAGNOSIS — E119 Type 2 diabetes mellitus without complications: Secondary | ICD-10-CM | POA: Diagnosis not present

## 2016-12-02 DIAGNOSIS — E7800 Pure hypercholesterolemia, unspecified: Secondary | ICD-10-CM | POA: Diagnosis not present

## 2016-12-02 DIAGNOSIS — Z0000 Encounter for general adult medical examination without abnormal findings: Secondary | ICD-10-CM | POA: Diagnosis not present

## 2016-12-02 DIAGNOSIS — Z79899 Other long term (current) drug therapy: Secondary | ICD-10-CM | POA: Diagnosis not present

## 2017-01-05 DIAGNOSIS — G4733 Obstructive sleep apnea (adult) (pediatric): Secondary | ICD-10-CM | POA: Diagnosis not present

## 2017-01-17 DIAGNOSIS — E119 Type 2 diabetes mellitus without complications: Secondary | ICD-10-CM | POA: Diagnosis not present

## 2017-01-17 DIAGNOSIS — B351 Tinea unguium: Secondary | ICD-10-CM | POA: Diagnosis not present

## 2017-01-19 DIAGNOSIS — H02831 Dermatochalasis of right upper eyelid: Secondary | ICD-10-CM | POA: Diagnosis not present

## 2017-01-19 DIAGNOSIS — H02834 Dermatochalasis of left upper eyelid: Secondary | ICD-10-CM | POA: Diagnosis not present

## 2017-02-24 ENCOUNTER — Other Ambulatory Visit (INDEPENDENT_AMBULATORY_CARE_PROVIDER_SITE_OTHER): Payer: Self-pay | Admitting: Vascular Surgery

## 2017-02-24 DIAGNOSIS — I700 Atherosclerosis of aorta: Secondary | ICD-10-CM

## 2017-02-27 ENCOUNTER — Ambulatory Visit (INDEPENDENT_AMBULATORY_CARE_PROVIDER_SITE_OTHER): Payer: PPO | Admitting: Vascular Surgery

## 2017-02-27 ENCOUNTER — Ambulatory Visit (INDEPENDENT_AMBULATORY_CARE_PROVIDER_SITE_OTHER): Payer: PPO

## 2017-02-27 DIAGNOSIS — E7800 Pure hypercholesterolemia, unspecified: Secondary | ICD-10-CM | POA: Diagnosis not present

## 2017-02-27 DIAGNOSIS — I10 Essential (primary) hypertension: Secondary | ICD-10-CM | POA: Diagnosis not present

## 2017-02-27 DIAGNOSIS — E119 Type 2 diabetes mellitus without complications: Secondary | ICD-10-CM | POA: Diagnosis not present

## 2017-02-27 DIAGNOSIS — I714 Abdominal aortic aneurysm, without rupture, unspecified: Secondary | ICD-10-CM

## 2017-02-27 DIAGNOSIS — I152 Hypertension secondary to endocrine disorders: Secondary | ICD-10-CM | POA: Insufficient documentation

## 2017-02-27 DIAGNOSIS — I700 Atherosclerosis of aorta: Secondary | ICD-10-CM | POA: Diagnosis not present

## 2017-02-27 NOTE — Progress Notes (Signed)
MRN : [id number]  Bryan Strong is a 71 y.o. (05/22/1946) male who presents with chief complaint of  Chief Complaint  Patient presents with  . AAA    1 year ultrasound follow up  .  History of Present Illness: The patient returns to the office for surveillance of a known abdominal aortic aneurysm. Patient denies abdominal pain or back pain, no other abdominal complaints. No changes suggesting embolic episodes.   There have been no interval changes in the patient's overall health care since his last visit.  Patient denies amaurosis fugax or TIA symptoms. There is no history of claudication or rest pain symptoms of the lower extremities. The patient denies angina or shortness of breath.   Duplex US of the aorta and iliac arteries shows an AAA measured 3.08 cm with 1.47 cm right iliac artery aneurysm.  Current Meds  Medication Sig  . ARTIFICIAL TEAR OP Apply to eye as needed.  Marland Kitchen ascorbic acid (VITAMIN C) 500 MG tablet Take 500 mg by mouth daily.  Marland Kitchen aspirin 325 MG tablet Take 325 mg by mouth daily.  Marland Kitchen azelastine (ASTELIN) 0.1 % nasal spray Place into both nostrils 2 (two) times daily. Use in each nostril as directed  . buPROPion (ZYBAN) 150 MG 12 hr tablet Take 150 mg by mouth daily.  . clopidogrel (PLAVIX) 75 MG tablet Take 75 mg by mouth daily.  Marland Kitchen doxycycline (MONODOX) 50 MG capsule Take 50 mg by mouth daily.  Marland Kitchen erythromycin [facility]) ophthalmic ointment Use a small amount on your sutures 4 times a day for the next 2 weeks. Switch to Aquaphor ointment should allergy develop.  . fexofenadine (ALLEGRA) 180 MG tablet Take 180 mg by mouth daily.  . Flaxseed, Linseed, (FLAXSEED OIL PO) Take by mouth 2 (two) times daily.  . fluticasone (FLONASE) 50 MCG/ACT nasal spray Place into both nostrils 2 (two) times daily.  Marland Kitchen lisinopril (PRINIVIL,ZESTRIL) 10 MG tablet Take 10 mg by mouth 2 (two) times daily.  . metFORMIN (GLUCOPHAGE) 1000 MG tablet Take 1,000 mg by mouth 2 (two) times daily with a  meal.  . metoprolol succinate (TOPROL-XL) 25 MG 24 hr tablet Take 12.5 mg by mouth 2 (two) times daily.  . Multiple Vitamin (MULTIVITAMIN) capsule Take 1 capsule by mouth daily.  Marland Kitchen oxyCODONE-acetaminophen (PERCOCET) 5-325 MG tablet Take 1 tablet by mouth every 4 (four) hours as needed for severe pain.  . pantoprazole (PROTONIX) 40 MG tablet Take 40 mg by mouth daily.  . simvastatin (ZOCOR) 40 MG tablet Take 40 mg by mouth daily.    Past Medical History:  Diagnosis Date  . A-fib ([place])   . Aortic aneurysm ([place])    followed by Dr. Franchot Gallo  . Diabetes mellitus without complication ([place])   . Emphysema of lung ([place])   . GERD (gastroesophageal reflux disease)   . Hypercholesteremia   . Hypertension   . Kidney stones   . Skin cancer   . Sleep apnea    CPAP  . Wears dentures    full upper and lower    Past Surgical History:  Procedure Laterality Date  . CHOLECYSTECTOMY    . CORONARY ANGIOPLASTY WITH STENT PLACEMENT  01/03/11   [facility]  Dr. Saralyn Pilar  . ECTROPION REPAIR Bilateral 05/03/2016   Procedure: REPAIR OF ECTROPION BILATERAL LOWER EYELIDS;  Surgeon: Karle Starch, MD;  Location: [place];  Service: Ophthalmology;  Laterality: Bilateral;  . HERNIA REPAIR    . LID LESION EXCISION Bilateral 05/03/2016  Procedure: ,EXCISION CYST RIGHT UPPER EYELID;  Surgeon: Karle Starch, MD;  Location: [place];  Service: Ophthalmology;  Laterality: Bilateral;  Diabetic - oral meds CPAP  . NASAL SEPTUM SURGERY  1967    Social History Social History  Substance Use Topics  . Smoking status: Former Smoker    Packs/day: 1.00    Years: 56.00    Types: Cigarettes  . Smokeless tobacco: Former Systems developer    Quit date: 09/19/2015  . Alcohol use No    Family History No family history on file. No family history of bleeding/clotting disorders, porphyria or autoimmune disease   Allergies  Allergen Reactions  . Other Rash    ERYTHROMYCIN OINTMENT "some nerve pill, caused HTN"      REVIEW OF SYSTEMS (Negative unless checked)  Constitutional: [] Weight loss  [] Fever  [] Chills Cardiac: [] Chest pain   [] Chest pressure   [] Palpitations   [] Shortness of breath when laying flat   [] Shortness of breath with exertion. Vascular:  [] Pain in legs with walking   [] Pain in legs at rest  [] History of DVT   [] Phlebitis   [] Swelling in legs   [] Varicose veins   [] Non-healing ulcers Pulmonary:   [] Uses home oxygen   [] Productive cough   [] Hemoptysis   [] Wheeze  [] COPD   [] Asthma Neurologic:  [] Dizziness   [] Seizures   [] History of stroke   [] History of TIA  [] Aphasia   [] Vissual changes   [] Weakness or numbness in arm   [] Weakness or numbness in leg Musculoskeletal:   [] Joint swelling   [] Joint pain   [] Low back pain Hematologic:  [] Easy bruising  [] Easy bleeding   [] Hypercoagulable state   [] Anemic Gastrointestinal:  [] Diarrhea   [] Vomiting  [] Gastroesophageal reflux/heartburn   [] Difficulty swallowing. Genitourinary:  [] Chronic kidney disease   [] Difficult urination  [] Frequent urination   [] Blood in urine Skin:  [] Rashes   [] Ulcers  Psychological:  [] History of anxiety   []  History of major depression.  Physical Examination  Vitals:   02/27/17 0844  BP: (!) 147/87  Pulse: 70  Resp: 16  Weight: 202 lb (91.6 kg)  Height: 5\' 10"  (1.778 m)   Body mass index is 28.98 kg/m. Gen: WD/WN, NAD Head: [place]/AT, No temporalis wasting.  Ear/Nose/Throat: Hearing grossly intact, nares w/o erythema or drainage, poor dentition Eyes: PER, EOMI, sclera nonicteric.  Neck: Supple, no masses.  No bruit or JVD.  Pulmonary:  Good air movement, clear to auscultation bilaterally, no use of accessory muscles.  Cardiac: RRR, normal S1, S2, no Murmurs. Vascular:  Vessel Right Left  Radial Palpable Palpable  Ulnar Palpable Palpable  Brachial Palpable Palpable  Carotid Palpable Palpable  Femoral Palpable Palpable  Popliteal Palpable Palpable  PT Palpable Palpable  DP Palpable Palpable    Gastrointestinal: soft, non-distended. No guarding/no peritoneal signs.  Musculoskeletal: M/S 5/5 throughout.  No deformity or atrophy.  Neurologic: CN 2-12 intact. Pain and light touch intact in extremities.  Symmetrical.  Speech is fluent. Motor exam as listed above. Psychiatric: Judgment intact, Mood & affect appropriate for pt's clinical situation. Dermatologic: No rashes or ulcers noted.  No changes consistent with cellulitis. Lymph : No Cervical lymphadenopathy, no lichenification or skin changes of chronic lymphedema.  CBC Lab Results  Component Value Date   WBC 8.6 07/15/2015   HGB 16.1 07/15/2015   HCT 47.2 07/15/2015   MCV 93.4 07/15/2015   PLT 138 (L) 07/15/2015    BMET    Component Value Date/Time   NA 139 07/15/2015 1401  NA 140 01/11/2013 2241   K 3.9 07/15/2015 1401   K 3.7 01/11/2013 2241   CL 103 07/15/2015 1401   CL 105 01/11/2013 2241   CO2 28 07/15/2015 1401   CO2 29 01/11/2013 2241   GLUCOSE 166 (H) 07/15/2015 1401   GLUCOSE 219 (H) 01/11/2013 2241   BUN 11 07/15/2015 1401   BUN 14 01/11/2013 2241   CREATININE 0.96 07/15/2015 1401   CREATININE 1.06 01/11/2013 2241   CALCIUM 9.7 07/15/2015 1401   CALCIUM 9.4 01/11/2013 2241   GFRNONAA >60 07/15/2015 1401   GFRNONAA >60 01/11/2013 2241   GFRAA >60 07/15/2015 1401   GFRAA >60 01/11/2013 2241   CrCl cannot be calculated (Patient's most recent lab result is older than the maximum 21 days allowed.).  COAG No results found for: INR, PROTIME  Radiology No results found.  Assessment/Plan 1. AAA (abdominal aortic aneurysm) without rupture (HCC) No surgery or intervention at this time.  The patient has an asymptomatic abdominal aortic aneurysm that is less than 4 cm in maximal diameter.  I have discussed the natural history of abdominal aortic aneurysm and the small risk of rupture for aneurysm less than 5 cm in size.  However, as these small aneurysms tend to enlarge over time, continued  surveillance with ultrasound or CT scan is mandatory.   I have also discussed optimizing medical management with hypertension and lipid control and the importance of abstinence from tobacco.  The patient is also encouraged to exercise a minimum of 30 minutes 4 times a week.   Should the patient develop new onset abdominal or back pain or signs of peripheral embolization they are instructed to seek medical attention immediately and to alert the physician providing care that they have an aneurysm.   The patient voices their understanding. The patient will return in 12 months with an aortic duplex.  - VAS US AORTA/IVC/ILIACS; Future  2. Essential hypertension Continue antihypertensive medications as already ordered, these medications have been reviewed and there are no changes at this time.  3. Type 2 diabetes mellitus without complication, without long-term current use of insulin (HCC) Continue hypoglycemic medications as already ordered, these medications have been reviewed and there are no changes at this time.  Hgb A1C to be monitored as already arranged by primary service  4. Pure hypercholesterolemia Continue statin as ordered and reviewed, no changes at this time    Hortencia Pilar, MD  02/27/2017 9:18 AM

## 2017-03-30 ENCOUNTER — Other Ambulatory Visit: Payer: Self-pay | Admitting: Specialist

## 2017-03-30 DIAGNOSIS — J449 Chronic obstructive pulmonary disease, unspecified: Secondary | ICD-10-CM | POA: Diagnosis not present

## 2017-03-30 DIAGNOSIS — R0602 Shortness of breath: Secondary | ICD-10-CM

## 2017-03-30 DIAGNOSIS — G4733 Obstructive sleep apnea (adult) (pediatric): Secondary | ICD-10-CM

## 2017-04-04 DIAGNOSIS — E119 Type 2 diabetes mellitus without complications: Secondary | ICD-10-CM | POA: Diagnosis not present

## 2017-04-04 DIAGNOSIS — I10 Essential (primary) hypertension: Secondary | ICD-10-CM | POA: Diagnosis not present

## 2017-04-04 DIAGNOSIS — H2513 Age-related nuclear cataract, bilateral: Secondary | ICD-10-CM | POA: Diagnosis not present

## 2017-04-04 DIAGNOSIS — H43813 Vitreous degeneration, bilateral: Secondary | ICD-10-CM | POA: Diagnosis not present

## 2017-04-04 DIAGNOSIS — H52223 Regular astigmatism, bilateral: Secondary | ICD-10-CM | POA: Diagnosis not present

## 2017-04-04 DIAGNOSIS — H04123 Dry eye syndrome of bilateral lacrimal glands: Secondary | ICD-10-CM | POA: Diagnosis not present

## 2017-04-04 DIAGNOSIS — H524 Presbyopia: Secondary | ICD-10-CM | POA: Diagnosis not present

## 2017-04-04 DIAGNOSIS — H5213 Myopia, bilateral: Secondary | ICD-10-CM | POA: Diagnosis not present

## 2017-04-04 DIAGNOSIS — H0289 Other specified disorders of eyelid: Secondary | ICD-10-CM | POA: Diagnosis not present

## 2017-04-18 DIAGNOSIS — M79672 Pain in left foot: Secondary | ICD-10-CM | POA: Diagnosis not present

## 2017-04-18 DIAGNOSIS — B351 Tinea unguium: Secondary | ICD-10-CM | POA: Diagnosis not present

## 2017-04-18 DIAGNOSIS — M79671 Pain in right foot: Secondary | ICD-10-CM | POA: Diagnosis not present

## 2017-04-18 DIAGNOSIS — D2370 Other benign neoplasm of skin of unspecified lower limb, including hip: Secondary | ICD-10-CM | POA: Diagnosis not present

## 2017-04-18 DIAGNOSIS — E119 Type 2 diabetes mellitus without complications: Secondary | ICD-10-CM | POA: Diagnosis not present

## 2017-05-01 DIAGNOSIS — R0602 Shortness of breath: Secondary | ICD-10-CM | POA: Diagnosis not present

## 2017-05-01 DIAGNOSIS — I10 Essential (primary) hypertension: Secondary | ICD-10-CM | POA: Diagnosis not present

## 2017-05-01 DIAGNOSIS — I480 Paroxysmal atrial fibrillation: Secondary | ICD-10-CM | POA: Diagnosis not present

## 2017-05-01 DIAGNOSIS — R079 Chest pain, unspecified: Secondary | ICD-10-CM | POA: Diagnosis not present

## 2017-05-01 DIAGNOSIS — I208 Other forms of angina pectoris: Secondary | ICD-10-CM | POA: Diagnosis not present

## 2017-05-01 DIAGNOSIS — I200 Unstable angina: Secondary | ICD-10-CM | POA: Diagnosis not present

## 2017-05-01 DIAGNOSIS — E119 Type 2 diabetes mellitus without complications: Secondary | ICD-10-CM | POA: Diagnosis not present

## 2017-05-01 DIAGNOSIS — G4733 Obstructive sleep apnea (adult) (pediatric): Secondary | ICD-10-CM | POA: Diagnosis not present

## 2017-05-01 DIAGNOSIS — F17200 Nicotine dependence, unspecified, uncomplicated: Secondary | ICD-10-CM | POA: Diagnosis not present

## 2017-05-01 DIAGNOSIS — K219 Gastro-esophageal reflux disease without esophagitis: Secondary | ICD-10-CM | POA: Diagnosis not present

## 2017-05-01 DIAGNOSIS — R011 Cardiac murmur, unspecified: Secondary | ICD-10-CM | POA: Diagnosis not present

## 2017-05-08 DIAGNOSIS — L03032 Cellulitis of left toe: Secondary | ICD-10-CM | POA: Diagnosis not present

## 2017-05-26 DIAGNOSIS — Z79899 Other long term (current) drug therapy: Secondary | ICD-10-CM | POA: Diagnosis not present

## 2017-05-26 DIAGNOSIS — E7800 Pure hypercholesterolemia, unspecified: Secondary | ICD-10-CM | POA: Diagnosis not present

## 2017-05-26 DIAGNOSIS — I208 Other forms of angina pectoris: Secondary | ICD-10-CM | POA: Diagnosis not present

## 2017-05-26 DIAGNOSIS — R011 Cardiac murmur, unspecified: Secondary | ICD-10-CM | POA: Diagnosis not present

## 2017-05-26 DIAGNOSIS — E119 Type 2 diabetes mellitus without complications: Secondary | ICD-10-CM | POA: Diagnosis not present

## 2017-05-26 DIAGNOSIS — R0602 Shortness of breath: Secondary | ICD-10-CM | POA: Diagnosis not present

## 2017-06-01 DIAGNOSIS — F17200 Nicotine dependence, unspecified, uncomplicated: Secondary | ICD-10-CM | POA: Diagnosis not present

## 2017-06-01 DIAGNOSIS — I10 Essential (primary) hypertension: Secondary | ICD-10-CM | POA: Diagnosis not present

## 2017-06-01 DIAGNOSIS — G4733 Obstructive sleep apnea (adult) (pediatric): Secondary | ICD-10-CM | POA: Diagnosis not present

## 2017-06-01 DIAGNOSIS — I480 Paroxysmal atrial fibrillation: Secondary | ICD-10-CM | POA: Diagnosis not present

## 2017-06-01 DIAGNOSIS — R011 Cardiac murmur, unspecified: Secondary | ICD-10-CM | POA: Diagnosis not present

## 2017-06-01 DIAGNOSIS — I208 Other forms of angina pectoris: Secondary | ICD-10-CM | POA: Diagnosis not present

## 2017-06-01 DIAGNOSIS — R0602 Shortness of breath: Secondary | ICD-10-CM | POA: Diagnosis not present

## 2017-06-01 DIAGNOSIS — K219 Gastro-esophageal reflux disease without esophagitis: Secondary | ICD-10-CM | POA: Diagnosis not present

## 2017-06-01 DIAGNOSIS — I200 Unstable angina: Secondary | ICD-10-CM | POA: Diagnosis not present

## 2017-06-08 ENCOUNTER — Encounter
Admission: RE | Disposition: A | Discharge status: Home / Self Care | Payer: Self-pay | Source: Ambulatory Visit | Attending: Internal Medicine

## 2017-06-08 ENCOUNTER — Ambulatory Visit
Admission: RE | Admit: 2017-06-08 | Discharge: 2017-06-08 | Disposition: A | Discharge status: Home / Self Care | Payer: PPO | Source: Ambulatory Visit | Attending: Internal Medicine | Admitting: Internal Medicine

## 2017-06-08 DIAGNOSIS — Z7982 Long term (current) use of aspirin: Secondary | ICD-10-CM | POA: Diagnosis not present

## 2017-06-08 DIAGNOSIS — Z955 Presence of coronary angioplasty implant and graft: Secondary | ICD-10-CM | POA: Diagnosis not present

## 2017-06-08 DIAGNOSIS — Z79899 Other long term (current) drug therapy: Secondary | ICD-10-CM | POA: Insufficient documentation

## 2017-06-08 DIAGNOSIS — I200 Unstable angina: Secondary | ICD-10-CM | POA: Diagnosis not present

## 2017-06-08 DIAGNOSIS — I2584 Coronary atherosclerosis due to calcified coronary lesion: Secondary | ICD-10-CM | POA: Insufficient documentation

## 2017-06-08 DIAGNOSIS — J309 Allergic rhinitis, unspecified: Secondary | ICD-10-CM | POA: Insufficient documentation

## 2017-06-08 DIAGNOSIS — J449 Chronic obstructive pulmonary disease, unspecified: Secondary | ICD-10-CM | POA: Diagnosis not present

## 2017-06-08 DIAGNOSIS — Z7984 Long term (current) use of oral hypoglycemic drugs: Secondary | ICD-10-CM | POA: Diagnosis not present

## 2017-06-08 DIAGNOSIS — I480 Paroxysmal atrial fibrillation: Secondary | ICD-10-CM | POA: Insufficient documentation

## 2017-06-08 DIAGNOSIS — I10 Essential (primary) hypertension: Secondary | ICD-10-CM | POA: Insufficient documentation

## 2017-06-08 DIAGNOSIS — E785 Hyperlipidemia, unspecified: Secondary | ICD-10-CM | POA: Insufficient documentation

## 2017-06-08 DIAGNOSIS — Z87891 Personal history of nicotine dependence: Secondary | ICD-10-CM | POA: Insufficient documentation

## 2017-06-08 DIAGNOSIS — Z7902 Long term (current) use of antithrombotics/antiplatelets: Secondary | ICD-10-CM | POA: Diagnosis not present

## 2017-06-08 DIAGNOSIS — I25110 Atherosclerotic heart disease of native coronary artery with unstable angina pectoris: Secondary | ICD-10-CM | POA: Insufficient documentation

## 2017-06-08 DIAGNOSIS — I208 Other forms of angina pectoris: Secondary | ICD-10-CM | POA: Diagnosis not present

## 2017-06-08 DIAGNOSIS — E119 Type 2 diabetes mellitus without complications: Secondary | ICD-10-CM | POA: Diagnosis not present

## 2017-06-08 DIAGNOSIS — K219 Gastro-esophageal reflux disease without esophagitis: Secondary | ICD-10-CM | POA: Insufficient documentation

## 2017-06-08 DIAGNOSIS — G4733 Obstructive sleep apnea (adult) (pediatric): Secondary | ICD-10-CM | POA: Diagnosis not present

## 2017-06-08 HISTORY — PX: LEFT HEART CATH: CATH118248

## 2017-06-08 LAB — GLUCOSE, CAPILLARY
GLUCOSE-CAPILLARY: 160 mg/dL — AB (ref 65–99)
Glucose-Capillary: 140 mg/dL — ABNORMAL HIGH (ref 65–99)

## 2017-06-08 LAB — CARDIAC CATHETERIZATION: Cath EF Quantitative: 60 %

## 2017-06-08 SURGERY — RIGHT/LEFT HEART CATH AND CORONARY ANGIOGRAPHY
Anesthesia: Moderate Sedation

## 2017-06-08 SURGERY — LEFT HEART CATH
Anesthesia: Moderate Sedation | Laterality: Left

## 2017-06-08 MED ORDER — ONDANSETRON HCL 4 MG/2ML IJ SOLN: 4.0000 mg | Freq: Four times a day (QID) | INTRAMUSCULAR | Status: DC | PRN

## 2017-06-08 MED ORDER — SODIUM CHLORIDE 0.9 % WEIGHT BASED INFUSION
274.8000 mL/h | INTRAVENOUS | Status: DC
  Administered 2017-06-08: 3 mL/kg/h via INTRAVENOUS

## 2017-06-08 MED ORDER — SODIUM CHLORIDE 0.9 % WEIGHT BASED INFUSION: 1.0000 mL/kg/h | INTRAVENOUS | Status: DC

## 2017-06-08 MED ORDER — IOPAMIDOL (ISOVUE-300) INJECTION 61%
INTRAVENOUS | Status: DC | PRN
  Administered 2017-06-08: 100 mL via INTRA_ARTERIAL

## 2017-06-08 MED ORDER — FENTANYL CITRATE (PF) 100 MCG/2ML IJ SOLN
INTRAMUSCULAR | Status: DC | PRN
  Administered 2017-06-08: 25 ug via INTRAVENOUS

## 2017-06-08 MED ORDER — SODIUM CHLORIDE 0.9 % IV SOLN: 250.0000 mL | INTRAVENOUS | Status: DC | PRN

## 2017-06-08 MED ORDER — FENTANYL CITRATE (PF) 100 MCG/2ML IJ SOLN
INTRAMUSCULAR | Status: AC
  Filled 2017-06-08: qty 2

## 2017-06-08 MED ORDER — SODIUM CHLORIDE 0.9% FLUSH: 3.0000 mL | Freq: Two times a day (BID) | INTRAVENOUS | Status: DC

## 2017-06-08 MED ORDER — SODIUM CHLORIDE 0.9% FLUSH: 3.0000 mL | INTRAVENOUS | Status: DC | PRN

## 2017-06-08 MED ORDER — HEPARIN (PORCINE) IN NACL 2-0.9 UNIT/ML-% IJ SOLN
INTRAMUSCULAR | Status: AC
  Filled 2017-06-08: qty 500

## 2017-06-08 MED ORDER — MIDAZOLAM HCL 2 MG/2ML IJ SOLN
INTRAMUSCULAR | Status: DC | PRN
  Administered 2017-06-08: 1 mg via INTRAVENOUS

## 2017-06-08 MED ORDER — ASPIRIN 81 MG PO CHEW: 81.0000 mg | CHEWABLE_TABLET | ORAL | Status: DC

## 2017-06-08 MED ORDER — MIDAZOLAM HCL 2 MG/2ML IJ SOLN
INTRAMUSCULAR | Status: AC
  Filled 2017-06-08: qty 2

## 2017-06-08 MED ORDER — SODIUM CHLORIDE 0.9 % WEIGHT BASED INFUSION: 91.6000 mL/h | INTRAVENOUS | Status: DC

## 2017-06-08 MED ORDER — ACETAMINOPHEN 325 MG PO TABS: 650.0000 mg | ORAL_TABLET | ORAL | Status: DC | PRN

## 2017-06-08 SURGICAL SUPPLY — 9 items
CATH 5FR PIGTAIL DIAGNOSTIC (CATHETERS) ×3 IMPLANT
CATH INFINITI 5FR JL4 (CATHETERS) ×3 IMPLANT
CATH INFINITI JR4 5F (CATHETERS) ×3 IMPLANT
DEVICE CLOSURE MYNXGRIP 5F (Vascular Products) ×3 IMPLANT
KIT MANI 3VAL PERCEP (MISCELLANEOUS) ×3 IMPLANT
NEEDLE PERC 18GX7CM (NEEDLE) ×3 IMPLANT
PACK CARDIAC CATH (CUSTOM PROCEDURE TRAY) ×3 IMPLANT
SHEATH AVANTI 5FR X 11CM (SHEATH) ×3 IMPLANT
WIRE EMERALD 3MM-J .035X150CM (WIRE) ×3 IMPLANT

## 2017-06-16 DIAGNOSIS — E119 Type 2 diabetes mellitus without complications: Secondary | ICD-10-CM | POA: Diagnosis not present

## 2017-06-16 DIAGNOSIS — D649 Anemia, unspecified: Secondary | ICD-10-CM | POA: Diagnosis not present

## 2017-06-16 DIAGNOSIS — I10 Essential (primary) hypertension: Secondary | ICD-10-CM | POA: Diagnosis not present

## 2017-06-16 DIAGNOSIS — Z125 Encounter for screening for malignant neoplasm of prostate: Secondary | ICD-10-CM | POA: Diagnosis not present

## 2017-06-16 DIAGNOSIS — I2510 Atherosclerotic heart disease of native coronary artery without angina pectoris: Secondary | ICD-10-CM | POA: Diagnosis not present

## 2017-06-16 DIAGNOSIS — Z79899 Other long term (current) drug therapy: Secondary | ICD-10-CM | POA: Diagnosis not present

## 2017-06-16 DIAGNOSIS — K219 Gastro-esophageal reflux disease without esophagitis: Secondary | ICD-10-CM | POA: Diagnosis not present

## 2017-06-16 DIAGNOSIS — E7800 Pure hypercholesterolemia, unspecified: Secondary | ICD-10-CM | POA: Diagnosis not present

## 2017-06-19 DIAGNOSIS — I10 Essential (primary) hypertension: Secondary | ICD-10-CM | POA: Diagnosis not present

## 2017-06-19 DIAGNOSIS — I208 Other forms of angina pectoris: Secondary | ICD-10-CM | POA: Diagnosis not present

## 2017-06-19 DIAGNOSIS — E119 Type 2 diabetes mellitus without complications: Secondary | ICD-10-CM | POA: Diagnosis not present

## 2017-06-19 DIAGNOSIS — R079 Chest pain, unspecified: Secondary | ICD-10-CM | POA: Diagnosis not present

## 2017-06-19 DIAGNOSIS — K219 Gastro-esophageal reflux disease without esophagitis: Secondary | ICD-10-CM | POA: Diagnosis not present

## 2017-06-19 DIAGNOSIS — F17200 Nicotine dependence, unspecified, uncomplicated: Secondary | ICD-10-CM | POA: Diagnosis not present

## 2017-06-19 DIAGNOSIS — I480 Paroxysmal atrial fibrillation: Secondary | ICD-10-CM | POA: Diagnosis not present

## 2017-06-19 DIAGNOSIS — G4733 Obstructive sleep apnea (adult) (pediatric): Secondary | ICD-10-CM | POA: Diagnosis not present

## 2017-06-19 DIAGNOSIS — Z9889 Other specified postprocedural states: Secondary | ICD-10-CM | POA: Diagnosis not present

## 2017-06-19 DIAGNOSIS — R0602 Shortness of breath: Secondary | ICD-10-CM | POA: Diagnosis not present

## 2017-06-19 DIAGNOSIS — R011 Cardiac murmur, unspecified: Secondary | ICD-10-CM | POA: Diagnosis not present

## 2017-06-26 ENCOUNTER — Other Ambulatory Visit: Payer: Self-pay | Admitting: Nurse Practitioner

## 2017-06-26 DIAGNOSIS — K220 Achalasia of cardia: Secondary | ICD-10-CM | POA: Insufficient documentation

## 2017-06-26 DIAGNOSIS — D649 Anemia, unspecified: Secondary | ICD-10-CM | POA: Insufficient documentation

## 2017-06-26 DIAGNOSIS — R1319 Other dysphagia: Secondary | ICD-10-CM | POA: Insufficient documentation

## 2017-06-26 DIAGNOSIS — K219 Gastro-esophageal reflux disease without esophagitis: Secondary | ICD-10-CM | POA: Diagnosis not present

## 2017-06-26 DIAGNOSIS — R1310 Dysphagia, unspecified: Secondary | ICD-10-CM

## 2017-06-26 DIAGNOSIS — K635 Polyp of colon: Secondary | ICD-10-CM | POA: Diagnosis not present

## 2017-06-30 DIAGNOSIS — D649 Anemia, unspecified: Secondary | ICD-10-CM | POA: Diagnosis not present

## 2017-07-04 ENCOUNTER — Ambulatory Visit
Admission: RE | Admit: 2017-07-04 | Discharge: 2017-07-04 | Disposition: A | Discharge status: Home / Self Care | Payer: PPO | Source: Ambulatory Visit | Attending: Nurse Practitioner | Admitting: Nurse Practitioner

## 2017-07-04 DIAGNOSIS — K259 Gastric ulcer, unspecified as acute or chronic, without hemorrhage or perforation: Secondary | ICD-10-CM | POA: Diagnosis not present

## 2017-07-04 DIAGNOSIS — K219 Gastro-esophageal reflux disease without esophagitis: Secondary | ICD-10-CM | POA: Diagnosis not present

## 2017-07-04 DIAGNOSIS — K222 Esophageal obstruction: Secondary | ICD-10-CM | POA: Insufficient documentation

## 2017-07-04 DIAGNOSIS — K224 Dyskinesia of esophagus: Secondary | ICD-10-CM | POA: Insufficient documentation

## 2017-07-04 DIAGNOSIS — R1319 Other dysphagia: Secondary | ICD-10-CM | POA: Diagnosis not present

## 2017-07-04 DIAGNOSIS — R1310 Dysphagia, unspecified: Secondary | ICD-10-CM

## 2017-07-04 DIAGNOSIS — R079 Chest pain, unspecified: Secondary | ICD-10-CM | POA: Diagnosis not present

## 2017-07-04 DIAGNOSIS — K220 Achalasia of cardia: Secondary | ICD-10-CM

## 2017-07-20 ENCOUNTER — Encounter: Payer: Self-pay | Admitting: *Deleted

## 2017-07-21 ENCOUNTER — Ambulatory Visit
Admission: RE | Admit: 2017-07-21 | Discharge: 2017-07-21 | Disposition: A | Discharge status: Home / Self Care | Payer: PPO | Source: Ambulatory Visit | Attending: Unknown Physician Specialty | Admitting: Unknown Physician Specialty

## 2017-07-21 ENCOUNTER — Ambulatory Visit: Payer: PPO | Admitting: Anesthesiology

## 2017-07-21 ENCOUNTER — Encounter
Admission: RE | Disposition: A | Discharge status: Home / Self Care | Payer: Self-pay | Source: Ambulatory Visit | Attending: Unknown Physician Specialty

## 2017-07-21 DIAGNOSIS — K3189 Other diseases of stomach and duodenum: Secondary | ICD-10-CM | POA: Diagnosis not present

## 2017-07-21 DIAGNOSIS — R1310 Dysphagia, unspecified: Secondary | ICD-10-CM | POA: Insufficient documentation

## 2017-07-21 DIAGNOSIS — Z7951 Long term (current) use of inhaled steroids: Secondary | ICD-10-CM | POA: Diagnosis not present

## 2017-07-21 DIAGNOSIS — R933 Abnormal findings on diagnostic imaging of other parts of digestive tract: Secondary | ICD-10-CM | POA: Insufficient documentation

## 2017-07-21 DIAGNOSIS — Z79899 Other long term (current) drug therapy: Secondary | ICD-10-CM | POA: Diagnosis not present

## 2017-07-21 DIAGNOSIS — Z7902 Long term (current) use of antithrombotics/antiplatelets: Secondary | ICD-10-CM | POA: Diagnosis not present

## 2017-07-21 DIAGNOSIS — Z7984 Long term (current) use of oral hypoglycemic drugs: Secondary | ICD-10-CM | POA: Diagnosis not present

## 2017-07-21 DIAGNOSIS — K2950 Unspecified chronic gastritis without bleeding: Secondary | ICD-10-CM | POA: Insufficient documentation

## 2017-07-21 DIAGNOSIS — G4730 Sleep apnea, unspecified: Secondary | ICD-10-CM | POA: Insufficient documentation

## 2017-07-21 DIAGNOSIS — K210 Gastro-esophageal reflux disease with esophagitis: Secondary | ICD-10-CM | POA: Diagnosis not present

## 2017-07-21 DIAGNOSIS — K228 Other specified diseases of esophagus: Secondary | ICD-10-CM | POA: Insufficient documentation

## 2017-07-21 DIAGNOSIS — Z955 Presence of coronary angioplasty implant and graft: Secondary | ICD-10-CM | POA: Diagnosis not present

## 2017-07-21 DIAGNOSIS — K2970 Gastritis, unspecified, without bleeding: Secondary | ICD-10-CM | POA: Diagnosis not present

## 2017-07-21 DIAGNOSIS — I10 Essential (primary) hypertension: Secondary | ICD-10-CM | POA: Diagnosis not present

## 2017-07-21 DIAGNOSIS — E7800 Pure hypercholesterolemia, unspecified: Secondary | ICD-10-CM | POA: Insufficient documentation

## 2017-07-21 DIAGNOSIS — J439 Emphysema, unspecified: Secondary | ICD-10-CM | POA: Insufficient documentation

## 2017-07-21 DIAGNOSIS — Z87891 Personal history of nicotine dependence: Secondary | ICD-10-CM | POA: Insufficient documentation

## 2017-07-21 DIAGNOSIS — Z9989 Dependence on other enabling machines and devices: Secondary | ICD-10-CM | POA: Insufficient documentation

## 2017-07-21 DIAGNOSIS — K2960 Other gastritis without bleeding: Secondary | ICD-10-CM | POA: Diagnosis not present

## 2017-07-21 DIAGNOSIS — K219 Gastro-esophageal reflux disease without esophagitis: Secondary | ICD-10-CM | POA: Diagnosis not present

## 2017-07-21 DIAGNOSIS — J449 Chronic obstructive pulmonary disease, unspecified: Secondary | ICD-10-CM | POA: Diagnosis not present

## 2017-07-21 DIAGNOSIS — I2510 Atherosclerotic heart disease of native coronary artery without angina pectoris: Secondary | ICD-10-CM | POA: Insufficient documentation

## 2017-07-21 DIAGNOSIS — E1151 Type 2 diabetes mellitus with diabetic peripheral angiopathy without gangrene: Secondary | ICD-10-CM | POA: Insufficient documentation

## 2017-07-21 DIAGNOSIS — Z7982 Long term (current) use of aspirin: Secondary | ICD-10-CM | POA: Insufficient documentation

## 2017-07-21 DIAGNOSIS — K220 Achalasia of cardia: Secondary | ICD-10-CM | POA: Diagnosis not present

## 2017-07-21 HISTORY — DX: Cardiac murmur, unspecified: R01.1

## 2017-07-21 HISTORY — DX: Dyspnea, unspecified: R06.00

## 2017-07-21 HISTORY — PX: ESOPHAGOGASTRODUODENOSCOPY (EGD) WITH PROPOFOL: SHX5813

## 2017-07-21 HISTORY — DX: Angina pectoris, unspecified: I20.9

## 2017-07-21 HISTORY — PX: SAVORY DILATION: SHX5439

## 2017-07-21 HISTORY — DX: Atherosclerotic heart disease of native coronary artery without angina pectoris: I25.10

## 2017-07-21 HISTORY — DX: Anemia, unspecified: D64.9

## 2017-07-21 LAB — GLUCOSE, CAPILLARY: Glucose-Capillary: 177 mg/dL — ABNORMAL HIGH (ref 65–99)

## 2017-07-21 SURGERY — ESOPHAGOGASTRODUODENOSCOPY (EGD) WITH PROPOFOL
Anesthesia: General

## 2017-07-21 MED ORDER — SODIUM CHLORIDE 0.9 % IV SOLN: INTRAVENOUS | Status: DC

## 2017-07-21 MED ORDER — PROPOFOL 500 MG/50ML IV EMUL
INTRAVENOUS | Status: AC
  Filled 2017-07-21: qty 50

## 2017-07-21 MED ORDER — PROPOFOL 10 MG/ML IV BOLUS
INTRAVENOUS | Status: DC | PRN
  Administered 2017-07-21: 20 mg via INTRAVENOUS
  Administered 2017-07-21: 30 mg via INTRAVENOUS

## 2017-07-21 MED ORDER — FENTANYL CITRATE (PF) 100 MCG/2ML IJ SOLN
INTRAMUSCULAR | Status: AC
  Filled 2017-07-21: qty 2

## 2017-07-21 MED ORDER — SODIUM CHLORIDE 0.9 % IV SOLN
INTRAVENOUS | Status: DC
  Administered 2017-07-21: 1000 mL via INTRAVENOUS

## 2017-07-21 MED ORDER — FENTANYL CITRATE (PF) 100 MCG/2ML IJ SOLN
INTRAMUSCULAR | Status: DC | PRN
  Administered 2017-07-21 (×2): 50 ug via INTRAVENOUS

## 2017-07-21 MED ORDER — PROPOFOL 500 MG/50ML IV EMUL
INTRAVENOUS | Status: DC | PRN
  Administered 2017-07-21: 120 ug/kg/min via INTRAVENOUS

## 2017-07-21 MED ORDER — LIDOCAINE HCL (PF) 2 % IJ SOLN
INTRAMUSCULAR | Status: AC
  Filled 2017-07-21: qty 2

## 2017-07-21 NOTE — Transfer of Care (Signed)
Immediate Anesthesia Transfer of Care Note  Patient: Bryan Strong  Procedure(s) Performed: Procedure(s): ESOPHAGOGASTRODUODENOSCOPY (EGD) WITH PROPOFOL (N/A) SAVORY DILATION (N/A)  Patient Location: PACU  Anesthesia Type:General  Level of Consciousness: awake  Airway & Oxygen Therapy: Patient Spontanous Breathing  Post-op Assessment: Report given to RN and Post -op Vital signs reviewed and stable  Post vital signs: Reviewed  Last Vitals:  Vitals:   07/21/17 0833  BP: (!) 141/81  Pulse: 70  Resp: 16  Temp: (!) 35.8 C    Last Pain:  Vitals:   07/21/17 0833  TempSrc: Tympanic         Complications: No apparent anesthesia complications

## 2017-07-21 NOTE — H&P (Signed)
Primary Care Physician:  Derinda Late, MD Primary Gastroenterologist:  Dr. Vira Agar  Pre-Procedure History & Physical: HPI:  Bryan Strong is a 71 y.o. male is here for an endoscopy.   Past Medical History:  Diagnosis Date  . A-fib ([place])   . Anemia   . Anginal pain ([place])   . Aortic aneurysm ([place])    followed by Dr. Franchot Gallo  . Coronary artery disease   . Diabetes mellitus without complication ([place])   . Dyspnea   . Emphysema of lung ([place])   . GERD (gastroesophageal reflux disease)   . Heart murmur   . Hypercholesteremia   . Hypertension   . Kidney stones   . Skin cancer   . Sleep apnea    CPAP  . Wears dentures    full upper and lower    Past Surgical History:  Procedure Laterality Date  . CHOLECYSTECTOMY    . colon polyps    . CORONARY ANGIOPLASTY WITH STENT PLACEMENT  01/03/11   [facility]  Dr. Saralyn Pilar  . ECTROPION REPAIR Bilateral 05/03/2016   Procedure: REPAIR OF ECTROPION BILATERAL LOWER EYELIDS;  Surgeon: Karle Starch, MD;  Location: [place];  Service: Ophthalmology;  Laterality: Bilateral;  . HERNIA REPAIR    . LEFT HEART CATH Left 06/08/2017   Procedure: Left Heart Cath and Coronary Angiography;  Surgeon: Yolonda Kida, MD;  Location: [place] [facility];  Service: Cardiovascular;  Laterality: Left;  . LID LESION EXCISION Bilateral 05/03/2016   Procedure: ,EXCISION CYST RIGHT UPPER EYELID;  Surgeon: Karle Starch, MD;  Location: [place];  Service: Ophthalmology;  Laterality: Bilateral;  Diabetic - oral meds CPAP  . NASAL SEPTUM SURGERY  1967    Prior to Admission medications   Medication Sig Start Date End Date Taking? Authorizing Provider  ascorbic acid (VITAMIN C) 500 MG tablet Take 500 mg by mouth daily.   Yes [provider]  aspirin 325 MG tablet Take 325 mg by mouth daily.   Yes [provider]  azelastine (ASTELIN) 0.1 % nasal spray Place 1 spray into both nostrils 2 (two) times daily. Use in each nostril as  directed    Yes [provider]  calcium carbonate (TUMS - DOSED IN MG ELEMENTAL CALCIUM) 500 MG chewable tablet Chew 2 tablets by mouth daily as needed for indigestion or heartburn.   Yes [provider]  clopidogrel (PLAVIX) 75 MG tablet Take 75 mg by mouth daily.   Yes [provider]  fexofenadine (ALLEGRA) 180 MG tablet Take 180 mg by mouth daily.   Yes [provider]  Flaxseed, Linseed, (FLAXSEED OIL PO) Take 1 tablet by mouth 2 (two) times daily.    Yes [provider]  fluticasone (FLONASE) 50 MCG/ACT nasal spray Place 1 spray into both nostrils 2 (two) times daily.    Yes [provider]  isosorbide mononitrate (IMDUR) 60 MG 24 hr tablet Take 60 mg by mouth daily at 3 pm.   Yes [provider]  lisinopril (PRINIVIL,ZESTRIL) 10 MG tablet Take 10 mg by mouth 2 (two) times daily.   Yes [provider]  metFORMIN (GLUCOPHAGE) 1000 MG tablet Take 1,000 mg by mouth 2 (two) times daily with a meal.   Yes [provider]  metoprolol tartrate (LOPRESSOR) 25 MG tablet Take 12.5 mg by mouth 2 (two) times daily.   Yes [provider]  Multiple Vitamin (MULTIVITAMIN WITH MINERALS) TABS tablet Take 1 tablet by mouth daily.  Yes [provider]  olopatadine (PATANOL) 0.1 % ophthalmic solution Place 1 drop into both eyes 2 (two) times daily.   Yes [provider]  pantoprazole (PROTONIX) 40 MG tablet Take 40 mg by mouth daily before breakfast.    Yes [provider]  Polyethyl Glycol-Propyl Glycol (SYSTANE ULTRA OP) Apply 2 drops to eye 5 (five) times daily as needed (dry eyes).   Yes [provider]  ranolazine (RANEXA) 500 MG 12 hr tablet Take 500 mg by mouth 2 (two) times daily.   Yes [provider]  simvastatin (ZOCOR) 40 MG tablet Take 40 mg by mouth every evening.    Yes [provider]  simvastatin (ZOCOR) 40 MG tablet Take 40 mg by mouth daily.   Yes  [provider]    Allergies as of 07/07/2017 - Review Complete 06/08/2017  Allergen Reaction Noted  . Other Rash 07/15/2015    History reviewed. No pertinent family history.  Social History   Social History  . Marital status: Married    Spouse name: N/A  . Number of children: N/A  . Years of education: N/A   Occupational History  . Not on file.   Social History Main Topics  . Smoking status: Former Smoker    Packs/day: 1.00    Years: 56.00    Types: Cigarettes  . Smokeless tobacco: Former Systems developer    Quit date: 09/19/2015  . Alcohol use No  . Drug use: No  . Sexual activity: Not on file   Other Topics Concern  . Not on file   Social History Narrative  . No narrative on file    Review of Systems: See HPI, otherwise negative ROS  Physical Exam: BP (!) 141/81   Pulse 70   Temp (!) 96.5 F (35.8 C) (Tympanic)   Resp 16   Ht 5\' 10"  (1.778 m)   Wt 90.7 kg (200 lb)   SpO2 99%   BMI 28.70 kg/m  General:   Alert,  pleasant and cooperative in NAD Head:  Normocephalic and atraumatic. Neck:  Supple; no masses or thyromegaly. Lungs:  Clear throughout to auscultation.    Heart:  Regular rate and rhythm. Abdomen:  Soft, nontender and nondistended. Normal bowel sounds, without guarding, and without rebound.   Neurologic:  Alert and  oriented x4;  grossly normal neurologically.  Impression/Plan: [name] is here for an endoscopy to be performed for dysphagia and history of achalasia  Risks, benefits, limitations, and alternatives regarding  endoscopy have been reviewed with the patient.  Questions have been answered.  All parties agreeable.   Gaylyn Cheers, MD  07/21/2017, 9:08 AM

## 2017-07-21 NOTE — Anesthesia Post-op Follow-up Note (Signed)
Anesthesia QCDR form completed.        

## 2017-07-21 NOTE — Anesthesia Postprocedure Evaluation (Signed)
Anesthesia Post Note  Patient: Bryan Strong  Procedure(s) Performed: Procedure(s) (LRB): ESOPHAGOGASTRODUODENOSCOPY (EGD) WITH PROPOFOL (N/A) SAVORY DILATION (N/A)  Patient location during evaluation: PACU Anesthesia Type: General Level of consciousness: awake Pain management: pain level controlled Vital Signs Assessment: post-procedure vital signs reviewed and stable Respiratory status: spontaneous breathing Cardiovascular status: stable Anesthetic complications: no     Last Vitals:  Vitals:   07/21/17 0940 07/21/17 1000  BP: (!) 101/58 (!) 154/90  Pulse: 60 66  Resp: 18 15  Temp:      Last Pain:  Vitals:   07/21/17 0930  TempSrc: Tympanic                 VAN STAVEREN,Jalaine Riggenbach

## 2017-07-21 NOTE — Op Note (Signed)
[facility] Gastroenterology Patient Name: Bryan Strong Procedure Date: 07/21/2017 9:09 AM MRN: [id number] Account #: [id number] Date of Birth: 08-11-1946 Admit Type: Outpatient Age: 71 Room: [facility] ENDO ROOM 3 Gender: Male Note Status: Finalized Procedure:            Upper GI endoscopy Indications:          Abnormal UGI series Providers:            Manya Silvas, MD Referring MD:         Caprice Renshaw MD (Referring MD) Medicines:            Propofol per Anesthesia Complications:        No immediate complications. Procedure:            Pre-Anesthesia Assessment:                       - After reviewing the risks and benefits, the patient                        was deemed in satisfactory condition to undergo the                        procedure.                       After obtaining informed consent, the endoscope was                        passed under direct vision. Throughout the procedure,                        the patient's blood pressure, pulse, and oxygen                        saturations were monitored continuously. The                        Colonoscope was introduced through the mouth, and                        advanced to the second part of duodenum. The upper GI                        endoscopy was accomplished without difficulty. The                        patient tolerated the procedure well. Findings:      A hypertonic lower esophageal sphincter was found. This was mild and       opened easily and stayed open. There was extra mucus in the esophagus       which I suctioned out before entering the stomach. The lower esophageal       sphincter relaxed so that on withdrawal I could see the inside of the       gastroesophageal area without difficulty. A biopsy was done of the       gastroesopohageal junction on the stomach side due to minimal erythema.       GEJ was 42cm.      Patchy mildly erythematous mucosa without bleeding was found in the     gastric antrum. Biopsies were taken with a cold  forceps for histology.       Biopsies were taken with a cold forceps for Helicobacter pylori testing.      The examined duodenum was normal. Impression:           - Hypertonic lower esophageal sphincter.                       - Erythematous mucosa in the antrum. Biopsied.                       - Normal examined duodenum. Recommendation:       - Await pathology results. No serious findings                        anywhere. Stay on medication. Eat slowly, chew well,                        take small bites. Manya Silvas, MD 07/21/2017 9:35:09 AM This report has been signed electronically. Number of Addenda: 0 Note Initiated On: 07/21/2017 9:09 AM      [facility]

## 2017-07-21 NOTE — Anesthesia Preprocedure Evaluation (Signed)
Anesthesia Evaluation  Patient identified by MRN, date of birth, ID band Patient awake    Reviewed: Allergy & Precautions, NPO status , Patient's Chart, lab work & pertinent test results  Airway Mallampati: II       Dental  (+) Teeth Intact   Pulmonary shortness of breath and with exertion, sleep apnea and Continuous Positive Airway Pressure Ventilation , COPD, former smoker,     + decreased breath sounds      Cardiovascular Exercise Tolerance: Good hypertension, Pt. on medications and Pt. on home beta blockers + angina + CAD and + Peripheral Vascular Disease  II Rhythm:Regular Rate:Normal     Neuro/Psych negative neurological ROS     GI/Hepatic Neg liver ROS, GERD  Medicated,  Endo/Other  diabetes, Well Controlled, Type 2  Renal/GU      Musculoskeletal   Abdominal Normal abdominal exam  (+)   Peds  Hematology  (+) anemia ,   Anesthesia Other Findings   Reproductive/Obstetrics                             Anesthesia Physical Anesthesia Plan  ASA: III  Anesthesia Plan: General   Post-op Pain Management:    Induction: Intravenous  PONV Risk Score and Plan: 0  Airway Management Planned: Natural Airway and Nasal Cannula  Additional Equipment:   Intra-op Plan:   Post-operative Plan:   Informed Consent: I have reviewed the patients History and Physical, chart, labs and discussed the procedure including the risks, benefits and alternatives for the proposed anesthesia with the patient or authorized representative who has indicated his/her understanding and acceptance.     Plan Discussed with: Surgeon  Anesthesia Plan Comments:         Anesthesia Quick Evaluation

## 2017-07-24 ENCOUNTER — Encounter: Payer: Self-pay | Admitting: Unknown Physician Specialty

## 2017-07-24 LAB — SURGICAL PATHOLOGY

## 2017-07-25 DIAGNOSIS — E119 Type 2 diabetes mellitus without complications: Secondary | ICD-10-CM | POA: Diagnosis not present

## 2017-07-25 DIAGNOSIS — M79672 Pain in left foot: Secondary | ICD-10-CM | POA: Diagnosis not present

## 2017-07-25 DIAGNOSIS — B351 Tinea unguium: Secondary | ICD-10-CM | POA: Diagnosis not present

## 2017-07-25 DIAGNOSIS — M79671 Pain in right foot: Secondary | ICD-10-CM | POA: Diagnosis not present

## 2017-09-15 ENCOUNTER — Encounter: Payer: Self-pay | Admitting: *Deleted

## 2017-09-18 ENCOUNTER — Ambulatory Visit
Admission: RE | Admit: 2017-09-18 | Discharge: 2017-09-18 | Disposition: A | Discharge status: Home / Self Care | Payer: PPO | Source: Ambulatory Visit | Attending: Unknown Physician Specialty | Admitting: Unknown Physician Specialty

## 2017-09-18 ENCOUNTER — Ambulatory Visit: Payer: PPO | Admitting: Anesthesiology

## 2017-09-18 ENCOUNTER — Encounter
Admission: RE | Disposition: A | Discharge status: Home / Self Care | Payer: Self-pay | Source: Ambulatory Visit | Attending: Unknown Physician Specialty

## 2017-09-18 DIAGNOSIS — K648 Other hemorrhoids: Secondary | ICD-10-CM | POA: Insufficient documentation

## 2017-09-18 DIAGNOSIS — Z87891 Personal history of nicotine dependence: Secondary | ICD-10-CM | POA: Insufficient documentation

## 2017-09-18 DIAGNOSIS — Z7982 Long term (current) use of aspirin: Secondary | ICD-10-CM | POA: Insufficient documentation

## 2017-09-18 DIAGNOSIS — G4730 Sleep apnea, unspecified: Secondary | ICD-10-CM | POA: Diagnosis not present

## 2017-09-18 DIAGNOSIS — Z955 Presence of coronary angioplasty implant and graft: Secondary | ICD-10-CM | POA: Insufficient documentation

## 2017-09-18 DIAGNOSIS — Z7902 Long term (current) use of antithrombotics/antiplatelets: Secondary | ICD-10-CM | POA: Insufficient documentation

## 2017-09-18 DIAGNOSIS — E7800 Pure hypercholesterolemia, unspecified: Secondary | ICD-10-CM | POA: Insufficient documentation

## 2017-09-18 DIAGNOSIS — I2510 Atherosclerotic heart disease of native coronary artery without angina pectoris: Secondary | ICD-10-CM | POA: Insufficient documentation

## 2017-09-18 DIAGNOSIS — Z7951 Long term (current) use of inhaled steroids: Secondary | ICD-10-CM | POA: Insufficient documentation

## 2017-09-18 DIAGNOSIS — Z9049 Acquired absence of other specified parts of digestive tract: Secondary | ICD-10-CM | POA: Insufficient documentation

## 2017-09-18 DIAGNOSIS — Z87442 Personal history of urinary calculi: Secondary | ICD-10-CM | POA: Insufficient documentation

## 2017-09-18 DIAGNOSIS — X58XXXA Exposure to other specified factors, initial encounter: Secondary | ICD-10-CM | POA: Insufficient documentation

## 2017-09-18 DIAGNOSIS — J439 Emphysema, unspecified: Secondary | ICD-10-CM | POA: Diagnosis not present

## 2017-09-18 DIAGNOSIS — T184XXA Foreign body in colon, initial encounter: Secondary | ICD-10-CM | POA: Insufficient documentation

## 2017-09-18 DIAGNOSIS — Z1211 Encounter for screening for malignant neoplasm of colon: Secondary | ICD-10-CM | POA: Insufficient documentation

## 2017-09-18 DIAGNOSIS — Z86010 Personal history of colonic polyps: Secondary | ICD-10-CM | POA: Diagnosis not present

## 2017-09-18 DIAGNOSIS — I4891 Unspecified atrial fibrillation: Secondary | ICD-10-CM | POA: Insufficient documentation

## 2017-09-18 DIAGNOSIS — K644 Residual hemorrhoidal skin tags: Secondary | ICD-10-CM | POA: Insufficient documentation

## 2017-09-18 DIAGNOSIS — Z79899 Other long term (current) drug therapy: Secondary | ICD-10-CM | POA: Diagnosis not present

## 2017-09-18 DIAGNOSIS — Z85828 Personal history of other malignant neoplasm of skin: Secondary | ICD-10-CM | POA: Diagnosis not present

## 2017-09-18 DIAGNOSIS — K219 Gastro-esophageal reflux disease without esophagitis: Secondary | ICD-10-CM | POA: Insufficient documentation

## 2017-09-18 DIAGNOSIS — Z9889 Other specified postprocedural states: Secondary | ICD-10-CM | POA: Insufficient documentation

## 2017-09-18 DIAGNOSIS — K5730 Diverticulosis of large intestine without perforation or abscess without bleeding: Secondary | ICD-10-CM | POA: Diagnosis not present

## 2017-09-18 DIAGNOSIS — K640 First degree hemorrhoids: Secondary | ICD-10-CM | POA: Diagnosis not present

## 2017-09-18 DIAGNOSIS — Z7984 Long term (current) use of oral hypoglycemic drugs: Secondary | ICD-10-CM | POA: Insufficient documentation

## 2017-09-18 DIAGNOSIS — E1151 Type 2 diabetes mellitus with diabetic peripheral angiopathy without gangrene: Secondary | ICD-10-CM | POA: Insufficient documentation

## 2017-09-18 DIAGNOSIS — K635 Polyp of colon: Secondary | ICD-10-CM | POA: Diagnosis not present

## 2017-09-18 DIAGNOSIS — I10 Essential (primary) hypertension: Secondary | ICD-10-CM | POA: Diagnosis not present

## 2017-09-18 DIAGNOSIS — E119 Type 2 diabetes mellitus without complications: Secondary | ICD-10-CM | POA: Diagnosis not present

## 2017-09-18 DIAGNOSIS — K5790 Diverticulosis of intestine, part unspecified, without perforation or abscess without bleeding: Secondary | ICD-10-CM | POA: Diagnosis not present

## 2017-09-18 DIAGNOSIS — R011 Cardiac murmur, unspecified: Secondary | ICD-10-CM | POA: Diagnosis not present

## 2017-09-18 DIAGNOSIS — D649 Anemia, unspecified: Secondary | ICD-10-CM | POA: Diagnosis not present

## 2017-09-18 HISTORY — PX: COLONOSCOPY WITH PROPOFOL: SHX5780

## 2017-09-18 LAB — GLUCOSE, CAPILLARY: GLUCOSE-CAPILLARY: 123 mg/dL — AB (ref 65–99)

## 2017-09-18 SURGERY — COLONOSCOPY WITH PROPOFOL
Anesthesia: General

## 2017-09-18 MED ORDER — SODIUM CHLORIDE 0.9 % IV SOLN: INTRAVENOUS | Status: DC

## 2017-09-18 MED ORDER — EPHEDRINE SULFATE-NACL 50-0.9 MG/10ML-% IV SOSY
PREFILLED_SYRINGE | INTRAVENOUS | Status: DC | PRN
  Administered 2017-09-18: 10 mg via INTRAVENOUS

## 2017-09-18 MED ORDER — EPHEDRINE SULFATE 50 MG/ML IJ SOLN
INTRAMUSCULAR | Status: AC
  Filled 2017-09-18: qty 1

## 2017-09-18 MED ORDER — PROPOFOL 10 MG/ML IV BOLUS
INTRAVENOUS | Status: DC | PRN
  Administered 2017-09-18: 90 mg via INTRAVENOUS

## 2017-09-18 MED ORDER — PROPOFOL 500 MG/50ML IV EMUL
INTRAVENOUS | Status: AC
  Filled 2017-09-18: qty 50

## 2017-09-18 MED ORDER — SODIUM CHLORIDE 0.9 % IV SOLN
INTRAVENOUS | Status: DC
  Administered 2017-09-18: 1000 mL via INTRAVENOUS

## 2017-09-18 MED ORDER — PROPOFOL 500 MG/50ML IV EMUL
INTRAVENOUS | Status: DC | PRN
  Administered 2017-09-18: 100 ug/kg/min via INTRAVENOUS

## 2017-09-18 NOTE — Transfer of Care (Signed)
Immediate Anesthesia Transfer of Care Note  Patient: Bryan Strong  Procedure(s) Performed: COLONOSCOPY WITH PROPOFOL (N/A )  Patient Location: PACU and Endoscopy Unit  Anesthesia Type:General  Level of Consciousness: drowsy and patient cooperative  Airway & Oxygen Therapy: Patient Spontanous Breathing and Patient connected to nasal cannula oxygen  Post-op Assessment: Report given to RN and Post -op Vital signs reviewed and stable  Post vital signs: Reviewed and stable  Last Vitals:  Vitals:   09/18/17 0850 09/18/17 0856  BP: 100/63 100/63  Pulse: 67 68  Resp: 16 18  Temp: (!) 35.7 C   SpO2: 99% 98%    Last Pain:  Vitals:   09/18/17 0850  TempSrc: Tympanic         Complications: No apparent anesthesia complications

## 2017-09-18 NOTE — Anesthesia Post-op Follow-up Note (Signed)
Anesthesia QCDR form completed.        

## 2017-09-18 NOTE — Anesthesia Preprocedure Evaluation (Signed)
Anesthesia Evaluation  Patient identified by MRN, date of birth, ID band Patient awake    Reviewed: Allergy & Precautions, NPO status , Patient's Chart, lab work & pertinent test results  History of Anesthesia Complications Negative for: history of anesthetic complications  Airway Mallampati: III  TM Distance: >3 FB Neck ROM: Full    Dental no notable dental hx.    Pulmonary sleep apnea , COPD, former smoker,    breath sounds clear to auscultation- rhonchi (-) wheezing      Cardiovascular hypertension, Pt. on medications + CAD, + Cardiac Stents (RCA stent 2012) and + Peripheral Vascular Disease  (-) CABG  Rhythm:Regular Rate:Normal - Systolic murmurs and - Diastolic murmurs    Neuro/Psych negative neurological ROS  negative psych ROS   GI/Hepatic Neg liver ROS, GERD  ,  Endo/Other  diabetes, Oral Hypoglycemic Agents  Renal/GU Renal disease: hx of nephrolithiasis.     Musculoskeletal   Abdominal (+) - obese,   Peds  Hematology  (+) anemia ,   Anesthesia Other Findings Past Medical History: No date: A-fib ([place]) No date: Anemia No date: Anginal pain ([place]) No date: Aortic aneurysm (HCC)     Comment:  followed by Dr. Franchot Gallo No date: Coronary artery disease No date: Diabetes mellitus without complication (HCC) No date: Dyspnea No date: Emphysema of lung (HCC) No date: GERD (gastroesophageal reflux disease) No date: Heart murmur No date: Hypercholesteremia No date: Hypertension No date: Kidney stones No date: Skin cancer No date: Sleep apnea     Comment:  CPAP No date: Wears dentures     Comment:  full upper and lower   Reproductive/Obstetrics                             Anesthesia Physical Anesthesia Plan  ASA: III  Anesthesia Plan: General   Post-op Pain Management:    Induction: Intravenous  PONV Risk Score and Plan: 1 and Propofol infusion  Airway Management  Planned: Natural Airway  Additional Equipment:   Intra-op Plan:   Post-operative Plan:   Informed Consent: I have reviewed the patients History and Physical, chart, labs and discussed the procedure including the risks, benefits and alternatives for the proposed anesthesia with the patient or authorized representative who has indicated his/her understanding and acceptance.   Dental advisory given  Plan Discussed with: CRNA and Anesthesiologist  Anesthesia Plan Comments:         Anesthesia Quick Evaluation

## 2017-09-18 NOTE — Op Note (Signed)
[facility] Gastroenterology Patient Name: Bryan Strong Procedure Date: 09/18/2017 8:19 AM MRN: [id number] Account #: [id number] Date of Birth: [birth year] Admit Type: Outpatient Age: 71 Room: [facility] ENDO ROOM 3 Gender: Male Note Status: Finalized Procedure:            Colonoscopy Indications:          High risk colon cancer surveillance: Personal history                        of colonic polyps Providers:            Manya Silvas, MD Referring MD:         Caprice Renshaw MD (Referring MD) Medicines:            Propofol per Anesthesia Complications:        No immediate complications. Procedure:            Pre-Anesthesia Assessment:                       - After reviewing the risks and benefits, the patient                        was deemed in satisfactory condition to undergo the                        procedure.                       After obtaining informed consent, the colonoscope was                        passed under direct vision. Throughout the procedure,                        the patient's blood pressure, pulse, and oxygen                        saturations were monitored continuously. The                        Colonoscope was introduced through the anus and                        advanced to the the cecum, identified by appendiceal                        orifice and ileocecal valve. The colonoscopy was                        performed without difficulty. The patient tolerated the                        procedure well. The quality of the bowel preparation                        was adequate to identify polyps. Findings:      A small polyp was found in the descending colon. The polyp was sessile.       The polyp was removed with a hot snare. Resection and retrieval were       complete.      Many small-mouthed  diverticula were found in the sigmoid colon and       descending colon.      External and internal hemorrhoids were found during endoscopy. The        hemorrhoids were medium-sized and Grade I (internal hemorrhoids that do       not prolapse).      The exam was otherwise without abnormality. Impression:           - One small polyp in the descending colon, removed with                        a hot snare. Resected and retrieved.                       - Diverticulosis in the sigmoid colon and in the                        descending colon.                       - External and internal hemorrhoids.                       - The examination was otherwise normal. Recommendation:       - Await pathology results. Manya Silvas, MD 09/18/2017 8:54:54 AM This report has been signed electronically. Number of Addenda: 0 Note Initiated On: 09/18/2017 8:19 AM Scope Withdrawal Time: 0 hours 18 minutes 8 seconds  Total Procedure Duration: 0 hours 27 minutes 44 seconds       [facility]

## 2017-09-18 NOTE — Anesthesia Postprocedure Evaluation (Signed)
Anesthesia Post Note  Patient: Bryan Strong  Procedure(s) Performed: COLONOSCOPY WITH PROPOFOL (N/A )  Patient location during evaluation: Endoscopy Anesthesia Type: General Level of consciousness: awake and alert and oriented Pain management: pain level controlled Vital Signs Assessment: post-procedure vital signs reviewed and stable Respiratory status: spontaneous breathing, nonlabored ventilation and respiratory function stable Cardiovascular status: blood pressure returned to baseline and stable Postop Assessment: no signs of nausea or vomiting Anesthetic complications: no     Last Vitals:  Vitals:   09/18/17 0900 09/18/17 0920  BP: (!) 96/59 112/74  Pulse: 65 72  Resp: (!) 25 15  Temp:    SpO2: 100% 97%    Last Pain:  Vitals:   09/18/17 0850  TempSrc: Tympanic                 [name]

## 2017-09-18 NOTE — H&P (Signed)
Primary Care Physician:  Derinda Late, MD Primary Gastroenterologist:  Dr. Vira Agar  Pre-Procedure History & Physical: HPI:  Bryan Strong is a 71 y.o. male is here for an colonoscopy.   Past Medical History:  Diagnosis Date  . A-fib ([place])   . Anemia   . Anginal pain ([place])   . Aortic aneurysm ([place])    followed by Dr. Franchot Gallo  . Coronary artery disease   . Diabetes mellitus without complication ([place])   . Dyspnea   . Emphysema of lung ([place])   . GERD (gastroesophageal reflux disease)   . Heart murmur   . Hypercholesteremia   . Hypertension   . Kidney stones   . Skin cancer   . Sleep apnea    CPAP  . Wears dentures    full upper and lower    Past Surgical History:  Procedure Laterality Date  . CHOLECYSTECTOMY    . colon polyps    . CORONARY ANGIOPLASTY WITH STENT PLACEMENT  01/03/11   [facility]  Dr. Saralyn Pilar  . ECTROPION REPAIR Bilateral 05/03/2016   Procedure: REPAIR OF ECTROPION BILATERAL LOWER EYELIDS;  Surgeon: Karle Starch, MD;  Location: [place];  Service: Ophthalmology;  Laterality: Bilateral;  . ESOPHAGOGASTRODUODENOSCOPY (EGD) WITH PROPOFOL N/A 07/21/2017   Procedure: ESOPHAGOGASTRODUODENOSCOPY (EGD) WITH PROPOFOL;  Surgeon: Manya Silvas, MD;  Location: [facility] ENDOSCOPY;  Service: Endoscopy;  Laterality: N/A;  . HERNIA REPAIR    . LEFT HEART CATH Left 06/08/2017   Procedure: Left Heart Cath and Coronary Angiography;  Surgeon: Yolonda Kida, MD;  Location: [place] CV LAB;  Service: Cardiovascular;  Laterality: Left;  . LID LESION EXCISION Bilateral 05/03/2016   Procedure: ,EXCISION CYST RIGHT UPPER EYELID;  Surgeon: Karle Starch, MD;  Location: [place];  Service: Ophthalmology;  Laterality: Bilateral;  Diabetic - oral meds CPAP  . NASAL SEPTUM SURGERY  1967  . SAVORY DILATION N/A 07/21/2017   Procedure: SAVORY DILATION;  Surgeon: Manya Silvas, MD;  Location: [facility] ENDOSCOPY;  Service: Endoscopy;  Laterality: N/A;    Prior to  Admission medications   Medication Sig Start Date End Date Taking? Authorizing Provider  ascorbic acid (VITAMIN C) 500 MG tablet Take 500 mg by mouth daily.   Yes [provider]  aspirin 325 MG tablet Take 325 mg by mouth daily.   Yes [provider]  azelastine (ASTELIN) 0.1 % nasal spray Place 1 spray into both nostrils 2 (two) times daily. Use in each nostril as directed    Yes [provider]  calcium carbonate (TUMS - DOSED IN MG ELEMENTAL CALCIUM) 500 MG chewable tablet Chew 2 tablets by mouth daily as needed for indigestion or heartburn.   Yes [provider]  clopidogrel (PLAVIX) 75 MG tablet Take 75 mg by mouth daily.   Yes [provider]  fexofenadine (ALLEGRA) 180 MG tablet Take 180 mg by mouth daily.   Yes [provider]  Flaxseed, Linseed, (FLAXSEED OIL PO) Take 1 tablet by mouth 2 (two) times daily.    Yes [provider]  fluticasone (FLONASE) 50 MCG/ACT nasal spray Place 1 spray into both nostrils 2 (two) times daily.    Yes [provider]  lisinopril (PRINIVIL,ZESTRIL) 10 MG tablet Take 10 mg by mouth 2 (two) times daily.   Yes [provider]  metFORMIN (GLUCOPHAGE) 1000 MG tablet Take 1,000 mg by mouth 2 (two) times daily with a meal.   Yes [provider]  metoprolol  tartrate (LOPRESSOR) 25 MG tablet Take 12.5 mg by mouth 2 (two) times daily.   Yes [provider]  Multiple Vitamin (MULTIVITAMIN WITH MINERALS) TABS tablet Take 1 tablet by mouth daily.   Yes [provider]  olopatadine (PATANOL) 0.1 % ophthalmic solution Place 1 drop into both eyes 2 (two) times daily.   Yes [provider]  pantoprazole (PROTONIX) 40 MG tablet Take 40 mg by mouth daily before breakfast.    Yes [provider]  Polyethyl Glycol-Propyl Glycol (SYSTANE ULTRA OP) Apply 2 drops to eye 5 (five) times daily as needed (dry eyes).   Yes [provider]  ranolazine  (RANEXA) 500 MG 12 hr tablet Take 500 mg by mouth 2 (two) times daily.   Yes [provider]  simvastatin (ZOCOR) 40 MG tablet Take 40 mg by mouth every evening.    Yes [provider]  simvastatin (ZOCOR) 40 MG tablet Take 40 mg by mouth daily.   Yes [provider]  isosorbide mononitrate (IMDUR) 60 MG 24 hr tablet Take 60 mg by mouth daily at 3 pm.    [provider]    Allergies as of 07/21/2017 - Review Complete 07/21/2017  Allergen Reaction Noted  . Other Rash 07/15/2015    History reviewed. No pertinent family history.  Social History   Social History  . Marital status: Married    Spouse name: N/A  . Number of children: N/A  . Years of education: N/A   Occupational History  . Not on file.   Social History Main Topics  . Smoking status: Former Smoker    Packs/day: 1.00    Years: 56.00    Types: Cigarettes  . Smokeless tobacco: Former Systems developer    Quit date: 09/19/2015  . Alcohol use No  . Drug use: No  . Sexual activity: Not on file   Other Topics Concern  . Not on file   Social History Narrative  . No narrative on file    Review of Systems: See HPI, otherwise negative ROS  Physical Exam: BP 98/65   Pulse 71   Temp (!) 96.5 F (35.8 C) (Tympanic)   Resp 16   Ht 5\' 10"  (1.778 m)   Wt 90.7 kg (200 lb)   SpO2 100%   BMI 28.70 kg/m  General:   Alert,  pleasant and cooperative in NAD Head:  Normocephalic and atraumatic. Neck:  Supple; no masses or thyromegaly. Lungs:  Clear throughout to auscultation.    Heart:  Regular rate and rhythm. Abdomen:  Soft, nontender and nondistended. Normal bowel sounds, without guarding, and without rebound.   Neurologic:  Alert and  oriented x4;  grossly normal neurologically.  Impression/Plan: [name] is here for an colonoscopy to be performed for [facility] colon polyps.  Risks, benefits, limitations, and alternatives regarding  colonoscopy have been reviewed with the patient.  Questions  have been answered.  All parties agreeable.   Gaylyn Cheers, MD  09/18/2017, 8:15 AM

## 2017-09-19 ENCOUNTER — Encounter: Payer: Self-pay | Admitting: Unknown Physician Specialty

## 2017-09-19 ENCOUNTER — Ambulatory Visit
Admission: RE | Admit: 2017-09-19 | Discharge: 2017-09-19 | Disposition: A | Discharge status: Home / Self Care | Payer: PPO | Source: Ambulatory Visit | Attending: Specialist | Admitting: Specialist

## 2017-09-19 DIAGNOSIS — R0602 Shortness of breath: Secondary | ICD-10-CM | POA: Diagnosis not present

## 2017-09-19 DIAGNOSIS — K228 Other specified diseases of esophagus: Secondary | ICD-10-CM | POA: Diagnosis not present

## 2017-09-19 DIAGNOSIS — I2510 Atherosclerotic heart disease of native coronary artery without angina pectoris: Secondary | ICD-10-CM | POA: Insufficient documentation

## 2017-09-19 DIAGNOSIS — E279 Disorder of adrenal gland, unspecified: Secondary | ICD-10-CM | POA: Diagnosis not present

## 2017-09-19 DIAGNOSIS — R911 Solitary pulmonary nodule: Secondary | ICD-10-CM | POA: Diagnosis not present

## 2017-09-19 DIAGNOSIS — R188 Other ascites: Secondary | ICD-10-CM | POA: Insufficient documentation

## 2017-09-19 DIAGNOSIS — G4733 Obstructive sleep apnea (adult) (pediatric): Secondary | ICD-10-CM | POA: Diagnosis present

## 2017-09-19 DIAGNOSIS — J439 Emphysema, unspecified: Secondary | ICD-10-CM | POA: Insufficient documentation

## 2017-09-19 DIAGNOSIS — I700 Atherosclerosis of aorta: Secondary | ICD-10-CM | POA: Diagnosis not present

## 2017-09-19 DIAGNOSIS — J449 Chronic obstructive pulmonary disease, unspecified: Secondary | ICD-10-CM | POA: Diagnosis present

## 2017-09-20 LAB — SURGICAL PATHOLOGY

## 2017-09-27 DIAGNOSIS — Z23 Encounter for immunization: Secondary | ICD-10-CM | POA: Diagnosis not present

## 2017-09-28 DIAGNOSIS — G4733 Obstructive sleep apnea (adult) (pediatric): Secondary | ICD-10-CM | POA: Diagnosis not present

## 2017-09-28 DIAGNOSIS — J449 Chronic obstructive pulmonary disease, unspecified: Secondary | ICD-10-CM | POA: Diagnosis not present

## 2017-09-28 DIAGNOSIS — R911 Solitary pulmonary nodule: Secondary | ICD-10-CM | POA: Diagnosis not present

## 2017-09-28 DIAGNOSIS — K229 Disease of esophagus, unspecified: Secondary | ICD-10-CM | POA: Diagnosis not present

## 2017-10-03 DIAGNOSIS — H52223 Regular astigmatism, bilateral: Secondary | ICD-10-CM | POA: Diagnosis not present

## 2017-10-03 DIAGNOSIS — H0289 Other specified disorders of eyelid: Secondary | ICD-10-CM | POA: Diagnosis not present

## 2017-10-03 DIAGNOSIS — H43813 Vitreous degeneration, bilateral: Secondary | ICD-10-CM | POA: Diagnosis not present

## 2017-10-03 DIAGNOSIS — H524 Presbyopia: Secondary | ICD-10-CM | POA: Diagnosis not present

## 2017-10-03 DIAGNOSIS — H5213 Myopia, bilateral: Secondary | ICD-10-CM | POA: Diagnosis not present

## 2017-10-03 DIAGNOSIS — H04123 Dry eye syndrome of bilateral lacrimal glands: Secondary | ICD-10-CM | POA: Diagnosis not present

## 2017-10-03 DIAGNOSIS — I10 Essential (primary) hypertension: Secondary | ICD-10-CM | POA: Diagnosis not present

## 2017-10-03 DIAGNOSIS — E119 Type 2 diabetes mellitus without complications: Secondary | ICD-10-CM | POA: Diagnosis not present

## 2017-10-03 DIAGNOSIS — H2513 Age-related nuclear cataract, bilateral: Secondary | ICD-10-CM | POA: Diagnosis not present

## 2017-10-17 ENCOUNTER — Encounter: Payer: Self-pay | Admitting: *Deleted

## 2017-10-18 ENCOUNTER — Encounter: Payer: Self-pay | Admitting: *Deleted

## 2017-10-18 ENCOUNTER — Encounter
Admission: RE | Disposition: A | Discharge status: Home / Self Care | Payer: Self-pay | Source: Ambulatory Visit | Attending: Unknown Physician Specialty

## 2017-10-18 ENCOUNTER — Ambulatory Visit: Payer: PPO | Admitting: Anesthesiology

## 2017-10-18 ENCOUNTER — Ambulatory Visit
Admission: RE | Admit: 2017-10-18 | Discharge: 2017-10-18 | Disposition: A | Discharge status: Home / Self Care | Payer: PPO | Source: Ambulatory Visit | Attending: Unknown Physician Specialty | Admitting: Unknown Physician Specialty

## 2017-10-18 DIAGNOSIS — Z9989 Dependence on other enabling machines and devices: Secondary | ICD-10-CM | POA: Insufficient documentation

## 2017-10-18 DIAGNOSIS — K228 Other specified diseases of esophagus: Secondary | ICD-10-CM | POA: Diagnosis not present

## 2017-10-18 DIAGNOSIS — R1310 Dysphagia, unspecified: Secondary | ICD-10-CM | POA: Insufficient documentation

## 2017-10-18 DIAGNOSIS — I4891 Unspecified atrial fibrillation: Secondary | ICD-10-CM | POA: Diagnosis not present

## 2017-10-18 DIAGNOSIS — Z79899 Other long term (current) drug therapy: Secondary | ICD-10-CM | POA: Insufficient documentation

## 2017-10-18 DIAGNOSIS — K208 Other esophagitis: Secondary | ICD-10-CM | POA: Diagnosis not present

## 2017-10-18 DIAGNOSIS — I719 Aortic aneurysm of unspecified site, without rupture: Secondary | ICD-10-CM | POA: Insufficient documentation

## 2017-10-18 DIAGNOSIS — R011 Cardiac murmur, unspecified: Secondary | ICD-10-CM | POA: Insufficient documentation

## 2017-10-18 DIAGNOSIS — J439 Emphysema, unspecified: Secondary | ICD-10-CM | POA: Diagnosis not present

## 2017-10-18 DIAGNOSIS — I10 Essential (primary) hypertension: Secondary | ICD-10-CM | POA: Insufficient documentation

## 2017-10-18 DIAGNOSIS — I2510 Atherosclerotic heart disease of native coronary artery without angina pectoris: Secondary | ICD-10-CM | POA: Diagnosis not present

## 2017-10-18 DIAGNOSIS — K219 Gastro-esophageal reflux disease without esophagitis: Secondary | ICD-10-CM | POA: Diagnosis not present

## 2017-10-18 DIAGNOSIS — Z7982 Long term (current) use of aspirin: Secondary | ICD-10-CM | POA: Diagnosis not present

## 2017-10-18 DIAGNOSIS — E119 Type 2 diabetes mellitus without complications: Secondary | ICD-10-CM | POA: Diagnosis not present

## 2017-10-18 DIAGNOSIS — K2950 Unspecified chronic gastritis without bleeding: Secondary | ICD-10-CM | POA: Diagnosis not present

## 2017-10-18 DIAGNOSIS — G4730 Sleep apnea, unspecified: Secondary | ICD-10-CM | POA: Insufficient documentation

## 2017-10-18 DIAGNOSIS — Z7984 Long term (current) use of oral hypoglycemic drugs: Secondary | ICD-10-CM | POA: Insufficient documentation

## 2017-10-18 DIAGNOSIS — R948 Abnormal results of function studies of other organs and systems: Secondary | ICD-10-CM | POA: Diagnosis not present

## 2017-10-18 DIAGNOSIS — Z85828 Personal history of other malignant neoplasm of skin: Secondary | ICD-10-CM | POA: Insufficient documentation

## 2017-10-18 DIAGNOSIS — K210 Gastro-esophageal reflux disease with esophagitis: Secondary | ICD-10-CM | POA: Insufficient documentation

## 2017-10-18 DIAGNOSIS — Z87442 Personal history of urinary calculi: Secondary | ICD-10-CM | POA: Insufficient documentation

## 2017-10-18 DIAGNOSIS — Z87891 Personal history of nicotine dependence: Secondary | ICD-10-CM | POA: Insufficient documentation

## 2017-10-18 DIAGNOSIS — R9389 Abnormal findings on diagnostic imaging of other specified body structures: Secondary | ICD-10-CM | POA: Diagnosis not present

## 2017-10-18 DIAGNOSIS — J449 Chronic obstructive pulmonary disease, unspecified: Secondary | ICD-10-CM | POA: Diagnosis not present

## 2017-10-18 DIAGNOSIS — E7800 Pure hypercholesterolemia, unspecified: Secondary | ICD-10-CM | POA: Insufficient documentation

## 2017-10-18 HISTORY — DX: Allergic rhinitis, unspecified: J30.9

## 2017-10-18 HISTORY — DX: Achalasia of cardia: K22.0

## 2017-10-18 HISTORY — PX: ESOPHAGOGASTRODUODENOSCOPY (EGD) WITH PROPOFOL: SHX5813

## 2017-10-18 LAB — GLUCOSE, CAPILLARY: Glucose-Capillary: 163 mg/dL — ABNORMAL HIGH (ref 65–99)

## 2017-10-18 SURGERY — ESOPHAGOGASTRODUODENOSCOPY (EGD) WITH PROPOFOL
Anesthesia: General | Laterality: Bilateral

## 2017-10-18 MED ORDER — FENTANYL CITRATE (PF) 100 MCG/2ML IJ SOLN
INTRAMUSCULAR | Status: DC | PRN
  Administered 2017-10-18: 50 ug via INTRAVENOUS
  Administered 2017-10-18 (×2): 25 ug via INTRAVENOUS

## 2017-10-18 MED ORDER — FENTANYL CITRATE (PF) 100 MCG/2ML IJ SOLN
INTRAMUSCULAR | Status: AC
  Filled 2017-10-18: qty 2

## 2017-10-18 MED ORDER — PROPOFOL 500 MG/50ML IV EMUL
INTRAVENOUS | Status: AC
  Filled 2017-10-18: qty 50

## 2017-10-18 MED ORDER — SODIUM CHLORIDE 0.9 % IV SOLN: INTRAVENOUS | Status: DC

## 2017-10-18 MED ORDER — BUTAMBEN-TETRACAINE-BENZOCAINE 2-2-14 % EX AERO
INHALATION_SPRAY | CUTANEOUS | Status: DC | PRN
  Administered 2017-10-18: 2 via TOPICAL

## 2017-10-18 MED ORDER — MIDAZOLAM HCL 2 MG/2ML IJ SOLN
INTRAMUSCULAR | Status: DC | PRN
  Administered 2017-10-18: 2 mg via INTRAVENOUS

## 2017-10-18 MED ORDER — LIDOCAINE HCL (CARDIAC) 20 MG/ML IV SOLN
INTRAVENOUS | Status: DC | PRN
  Administered 2017-10-18: 30 mg via INTRAVENOUS

## 2017-10-18 MED ORDER — SODIUM CHLORIDE 0.9 % IV SOLN
INTRAVENOUS | Status: DC
  Administered 2017-10-18: 07:00:00 via INTRAVENOUS

## 2017-10-18 MED ORDER — LIDOCAINE HCL (PF) 2 % IJ SOLN
INTRAMUSCULAR | Status: AC
  Filled 2017-10-18: qty 10

## 2017-10-18 MED ORDER — PROPOFOL 500 MG/50ML IV EMUL
INTRAVENOUS | Status: DC | PRN
  Administered 2017-10-18: 120 ug/kg/min via INTRAVENOUS

## 2017-10-18 MED ORDER — MIDAZOLAM HCL 2 MG/2ML IJ SOLN
INTRAMUSCULAR | Status: AC
  Filled 2017-10-18: qty 2

## 2017-10-18 NOTE — Transfer of Care (Signed)
Immediate Anesthesia Transfer of Care Note  Patient: Bryan Strong  Procedure(s) Performed: ESOPHAGOGASTRODUODENOSCOPY (EGD) WITH PROPOFOL (Bilateral )  Patient Location: PACU  Anesthesia Type:General  Level of Consciousness: awake and sedated  Airway & Oxygen Therapy: Patient Spontanous Breathing and Patient connected to nasal cannula oxygen  Post-op Assessment: Report given to RN and Post -op Vital signs reviewed and stable  Post vital signs: Reviewed and stable  Last Vitals:  Vitals:   10/18/17 0650  BP: 133/75  Pulse: 64  Resp: 20  Temp: (!) 36.1 C  SpO2: 96%    Last Pain:  Vitals:   10/18/17 0650  TempSrc: Tympanic         Complications: No apparent anesthesia complications

## 2017-10-18 NOTE — Anesthesia Post-op Follow-up Note (Signed)
Anesthesia QCDR form completed.        

## 2017-10-18 NOTE — Anesthesia Postprocedure Evaluation (Signed)
Anesthesia Post Note  Patient: Bryan Strong  Procedure(s) Performed: ESOPHAGOGASTRODUODENOSCOPY (EGD) WITH PROPOFOL (Bilateral )  Patient location during evaluation: PACU Anesthesia Type: General Level of consciousness: awake Pain management: pain level controlled Vital Signs Assessment: post-procedure vital signs reviewed and stable Respiratory status: spontaneous breathing Cardiovascular status: stable Anesthetic complications: no     Last Vitals:  Vitals:   10/18/17 0812 10/18/17 0822  BP: 95/66 100/66  Pulse: (!) 51 (!) 55  Resp: 13 13  Temp:    SpO2: 96% 96%    Last Pain:  Vitals:   10/18/17 0752  TempSrc: Tympanic                 VAN STAVEREN,Jaidynn Balster

## 2017-10-18 NOTE — H&P (Signed)
Primary Care Physician:  Derinda Late, MD Primary Gastroenterologist:  Dr. Vira Agar  Pre-Procedure History & Physical: HPI:  Bryan Strong is a 71 y.o. male is here for an endoscopy.   Past Medical History:  Diagnosis Date  . A-fib ([place])   . Achalasia of esophagus   . Allergic rhinitis   . Anemia   . Anginal pain ([place])   . Aortic aneurysm ([place])    followed by Dr. Franchot Gallo  . Coronary artery disease   . Diabetes mellitus without complication ([place])   . Dyspnea   . Emphysema of lung ([place])   . GERD (gastroesophageal reflux disease)   . Heart murmur   . Hypercholesteremia   . Hypertension   . Kidney stones   . Skin cancer   . Sleep apnea    CPAP  . Wears dentures    full upper and lower    Past Surgical History:  Procedure Laterality Date  . CHOLECYSTECTOMY    . colon polyps    . COLONOSCOPY WITH PROPOFOL N/A 09/18/2017   Procedure: COLONOSCOPY WITH PROPOFOL;  Surgeon: Manya Silvas, MD;  Location[facility] ENDOSCOPY;  Service: Endoscopy;  Laterality: N/A;  . CORONARY ANGIOPLASTY WITH STENT PLACEMENT  01/03/11   [facility]  Dr. Saralyn Pilar  . ECTROPION REPAIR Bilateral 05/03/2016   Procedure: REPAIR OF ECTROPION BILATERAL LOWER EYELIDS;  Surgeon: Karle Starch, MD;  Location: [place];  Service: Ophthalmology;  Laterality: Bilateral;  . ESOPHAGOGASTRODUODENOSCOPY (EGD) WITH PROPOFOL N/A 07/21/2017   Procedure: ESOPHAGOGASTRODUODENOSCOPY (EGD) WITH PROPOFOL;  Surgeon: Manya Silvas, MD;  Location: [facility] ENDOSCOPY;  Service: Endoscopy;  Laterality: N/A;  . HERNIA REPAIR    . LEFT HEART CATH Left 06/08/2017   Procedure: Left Heart Cath and Coronary Angiography;  Surgeon: Yolonda Kida, MD;  Location: [place] CV LAB;  Service: Cardiovascular;  Laterality: Left;  . LID LESION EXCISION Bilateral 05/03/2016   Procedure: ,EXCISION CYST RIGHT UPPER EYELID;  Surgeon: Karle Starch, MD;  Location: [place];  Service: Ophthalmology;  Laterality: Bilateral;   Diabetic - oral meds CPAP  . NASAL SEPTUM SURGERY  1967  . SAVORY DILATION N/A 07/21/2017   Procedure: SAVORY DILATION;  Surgeon: Manya Silvas, MD;  Location[facility] ENDOSCOPY;  Service: Endoscopy;  Laterality: N/A;    Prior to Admission medications   Medication Sig Start Date End Date Taking? Authorizing Provider  calcium carbonate (TUMS - DOSED IN MG ELEMENTAL CALCIUM) 500 MG chewable tablet Chew 2 tablets by mouth daily as needed for indigestion or heartburn.   Yes [provider]  fexofenadine (ALLEGRA) 180 MG tablet Take 180 mg by mouth daily.   Yes [provider]  fluticasone (FLONASE) 50 MCG/ACT nasal spray Place 1 spray into both nostrils 2 (two) times daily.    Yes [provider]  lisinopril (PRINIVIL,ZESTRIL) 10 MG tablet Take 10 mg by mouth 2 (two) times daily.   Yes [provider]  metFORMIN (GLUCOPHAGE) 1000 MG tablet Take 1,000 mg by mouth 2 (two) times daily with a meal.   Yes [provider]  metoprolol tartrate (LOPRESSOR) 25 MG tablet Take 12.5 mg by mouth 2 (two) times daily.   Yes [provider]  olopatadine (PATANOL) 0.1 % ophthalmic solution Place 1 drop into both eyes 2 (two) times daily.   Yes [provider]  pantoprazole (PROTONIX) 40 MG tablet Take 40 mg by mouth daily before breakfast.    Yes [provider]  Polyethyl Glycol-Propyl  Glycol (SYSTANE ULTRA OP) Apply 2 drops to eye 5 (five) times daily as needed (dry eyes).   Yes [provider]  simvastatin (ZOCOR) 40 MG tablet Take 40 mg by mouth every evening.    Yes [provider]  ascorbic acid (VITAMIN C) 500 MG tablet Take 500 mg by mouth daily.    [provider]  aspirin 325 MG tablet Take 325 mg by mouth daily.    [provider]  azelastine (ASTELIN) 0.1 % nasal spray Place 1 spray into both nostrils 2 (two) times daily. Use in each nostril as directed     [provider]  clopidogrel  (PLAVIX) 75 MG tablet Take 75 mg by mouth daily.    [provider]  Flaxseed, Linseed, (FLAXSEED OIL PO) Take 1 tablet by mouth 2 (two) times daily.     [provider]  isosorbide mononitrate (IMDUR) 60 MG 24 hr tablet Take 60 mg by mouth daily at 3 pm.    [provider]  Multiple Vitamin (MULTIVITAMIN WITH MINERALS) TABS tablet Take 1 tablet by mouth daily.    [provider]  ranolazine (RANEXA) 500 MG 12 hr tablet Take 500 mg by mouth 2 (two) times daily.    [provider]  simvastatin (ZOCOR) 40 MG tablet Take 40 mg by mouth daily.    [provider]    Allergies as of 10/13/2017 - Review Complete 09/18/2017  Allergen Reaction Noted  . Other Rash 07/15/2015    History reviewed. No pertinent family history.  Social History   Social History  . Marital status: Married    Spouse name: N/A  . Number of children: N/A  . Years of education: N/A   Occupational History  . Not on file.   Social History Main Topics  . Smoking status: Former Smoker    Packs/day: 1.00    Years: 56.00    Types: Cigarettes  . Smokeless tobacco: Never Used  . Alcohol use No  . Drug use: No  . Sexual activity: Not on file   Other Topics Concern  . Not on file   Social History Narrative  . No narrative on file    Review of Systems: See HPI, otherwise negative ROS  Physical Exam: BP 133/75   Pulse 64   Temp (!) 97 F (36.1 C) (Tympanic)   Resp 20   Ht 5\' 10"  (1.778 m)   Wt 90.7 kg (200 lb)   SpO2 96%   BMI 28.70 kg/m  General:   Alert,  pleasant and cooperative in NAD Head:  Normocephalic and atraumatic. Neck:  Supple; no masses or thyromegaly. Lungs:  Clear throughout to auscultation.    Heart:  Regular rate and rhythm. Abdomen:  Soft, nontender and nondistended. Normal bowel sounds, without guarding, and without rebound.   Neurologic:  Alert and  oriented x4;  grossly normal neurologically.  Impression/Plan: [name] is  here for an endoscopy to be performed for dysphagia and abnormal CT of esophagus showing thickening.  Risks, benefits, limitations, and alternatives regarding  endoscopy have been reviewed with the patient.  Questions have been answered.  All parties agreeable.   Gaylyn Cheers, MD  10/18/2017, 7:27 AM egd

## 2017-10-18 NOTE — Op Note (Signed)
[facility] Gastroenterology Patient Name: Bryan Strong Procedure Date: 10/18/2017 7:31 AM MRN: [id number] Account #: [id number] Date of Birth: 03-01-1946 Admit Type: Outpatient Age: 71 Room: [facility] ENDO ROOM 3 Gender: Male Note Status: Finalized Procedure:            Upper GI endoscopy Indications:          Dysphagia, Abnormal CT of the GI tract Providers:            Manya Silvas, MD Referring MD:         Caprice Renshaw MD (Referring MD) Medicines:            Propofol per Anesthesia Complications:        No immediate complications. Procedure:            Pre-Anesthesia Assessment:                       - After reviewing the risks and benefits, the patient                        was deemed in satisfactory condition to undergo the                        procedure.                       After obtaining informed consent, the endoscope was                        passed under direct vision. Throughout the procedure,                        the patient's blood pressure, pulse, and oxygen                        saturations were monitored continuously. The Endoscope                        was introduced through the mouth, and advanced to the                        second part of duodenum. The upper GI endoscopy was                        accomplished without difficulty. The patient tolerated                        the procedure well. Findings:      The examined esophagus was normal. Biopsies were taken with a cold       forceps for histology. Done at [facility] and at mid esophagus although no       abnormaliy seen except slight adherent mucosal mucus.      The entire examined stomach was normal.      The examined duodenum was normal. Impression:           - Normal esophagus. Biopsied. False positive of CT of                        esophagus, likely due to muscular thichening of the  esophagus due to achalasia. Will discuss with [facility]            specialist Dr. Fara Olden.                       - Normal stomach.                       - Normal examined duodenum. Recommendation:       - Await pathology results. Manya Silvas, MD 10/18/2017 7:52:49 AM This report has been signed electronically. Number of Addenda: 0 Note Initiated On: 10/18/2017 7:31 AM      [facility]

## 2017-10-18 NOTE — Anesthesia Preprocedure Evaluation (Signed)
Anesthesia Evaluation  Patient identified by MRN, date of birth, ID band Patient awake    Reviewed: Allergy & Precautions, NPO status , Patient's Chart, lab work & pertinent test results  Airway Mallampati: II       Dental  (+) Upper Dentures, Lower Dentures   Pulmonary shortness of breath, sleep apnea , COPD,  COPD inhaler, former smoker,     + decreased breath sounds      Cardiovascular Exercise Tolerance: Good hypertension, Pt. on medications and Pt. on home beta blockers + CAD and + Peripheral Vascular Disease  + dysrhythmias Atrial Fibrillation II Rhythm:Regular Rate:Normal     Neuro/Psych negative neurological ROS  negative psych ROS   GI/Hepatic Neg liver ROS, GERD  Medicated,  Endo/Other  diabetes, Type 2, Oral Hypoglycemic Agents  Renal/GU      Musculoskeletal   Abdominal Normal abdominal exam  (+)   Peds  Hematology  (+) anemia ,   Anesthesia Other Findings   Reproductive/Obstetrics                             Anesthesia Physical Anesthesia Plan  ASA: III  Anesthesia Plan: General   Post-op Pain Management:    Induction: Intravenous  PONV Risk Score and Plan:   Airway Management Planned: Natural Airway and Nasal Cannula  Additional Equipment:   Intra-op Plan:   Post-operative Plan:   Informed Consent: I have reviewed the patients History and Physical, chart, labs and discussed the procedure including the risks, benefits and alternatives for the proposed anesthesia with the patient or authorized representative who has indicated his/her understanding and acceptance.     Plan Discussed with: CRNA  Anesthesia Plan Comments:         Anesthesia Quick Evaluation

## 2017-10-18 NOTE — Anesthesia Procedure Notes (Signed)
Performed by: COOK-MARTIN, Wayburn Shaler Pre-anesthesia Checklist: Patient identified, Suction available, Emergency Drugs available, Patient being monitored and Timeout performed Patient Re-evaluated:Patient Re-evaluated prior to induction Oxygen Delivery Method: Nasal cannula Preoxygenation: Pre-oxygenation with 100% oxygen Induction Type: IV induction Airway Equipment and Method: Bite block Placement Confirmation: positive ETCO2 and CO2 detector       

## 2017-10-19 DIAGNOSIS — L986 Other infiltrative disorders of the skin and subcutaneous tissue: Secondary | ICD-10-CM | POA: Diagnosis not present

## 2017-10-19 DIAGNOSIS — H02842 Edema of right lower eyelid: Secondary | ICD-10-CM | POA: Diagnosis not present

## 2017-10-19 LAB — SURGICAL PATHOLOGY

## 2017-10-20 ENCOUNTER — Encounter: Payer: Self-pay | Admitting: Unknown Physician Specialty

## 2017-10-26 DIAGNOSIS — M79671 Pain in right foot: Secondary | ICD-10-CM | POA: Diagnosis not present

## 2017-10-26 DIAGNOSIS — B351 Tinea unguium: Secondary | ICD-10-CM | POA: Diagnosis not present

## 2017-10-26 DIAGNOSIS — M79672 Pain in left foot: Secondary | ICD-10-CM | POA: Diagnosis not present

## 2017-11-14 DIAGNOSIS — L03031 Cellulitis of right toe: Secondary | ICD-10-CM | POA: Diagnosis not present

## 2017-12-13 DIAGNOSIS — S92515A Nondisplaced fracture of proximal phalanx of left lesser toe(s), initial encounter for closed fracture: Secondary | ICD-10-CM | POA: Diagnosis not present

## 2017-12-13 DIAGNOSIS — Z01818 Encounter for other preprocedural examination: Secondary | ICD-10-CM | POA: Diagnosis not present

## 2017-12-13 DIAGNOSIS — Z125 Encounter for screening for malignant neoplasm of prostate: Secondary | ICD-10-CM | POA: Diagnosis not present

## 2017-12-13 DIAGNOSIS — E7800 Pure hypercholesterolemia, unspecified: Secondary | ICD-10-CM | POA: Diagnosis not present

## 2017-12-13 DIAGNOSIS — E119 Type 2 diabetes mellitus without complications: Secondary | ICD-10-CM | POA: Diagnosis not present

## 2017-12-13 DIAGNOSIS — Z23 Encounter for immunization: Secondary | ICD-10-CM | POA: Diagnosis not present

## 2017-12-13 DIAGNOSIS — D649 Anemia, unspecified: Secondary | ICD-10-CM | POA: Diagnosis not present

## 2017-12-13 DIAGNOSIS — Z79899 Other long term (current) drug therapy: Secondary | ICD-10-CM | POA: Diagnosis not present

## 2017-12-13 DIAGNOSIS — S99922A Unspecified injury of left foot, initial encounter: Secondary | ICD-10-CM | POA: Diagnosis not present

## 2017-12-18 DIAGNOSIS — Z0000 Encounter for general adult medical examination without abnormal findings: Secondary | ICD-10-CM | POA: Diagnosis not present

## 2018-01-10 DIAGNOSIS — R011 Cardiac murmur, unspecified: Secondary | ICD-10-CM | POA: Diagnosis not present

## 2018-01-10 DIAGNOSIS — Z9889 Other specified postprocedural states: Secondary | ICD-10-CM | POA: Diagnosis not present

## 2018-01-10 DIAGNOSIS — F17200 Nicotine dependence, unspecified, uncomplicated: Secondary | ICD-10-CM | POA: Diagnosis not present

## 2018-01-10 DIAGNOSIS — I10 Essential (primary) hypertension: Secondary | ICD-10-CM | POA: Diagnosis not present

## 2018-01-10 DIAGNOSIS — I480 Paroxysmal atrial fibrillation: Secondary | ICD-10-CM | POA: Diagnosis not present

## 2018-01-10 DIAGNOSIS — K219 Gastro-esophageal reflux disease without esophagitis: Secondary | ICD-10-CM | POA: Diagnosis not present

## 2018-01-10 DIAGNOSIS — G4733 Obstructive sleep apnea (adult) (pediatric): Secondary | ICD-10-CM | POA: Diagnosis not present

## 2018-01-10 DIAGNOSIS — R079 Chest pain, unspecified: Secondary | ICD-10-CM | POA: Diagnosis not present

## 2018-01-10 DIAGNOSIS — I200 Unstable angina: Secondary | ICD-10-CM | POA: Diagnosis not present

## 2018-01-10 DIAGNOSIS — R0602 Shortness of breath: Secondary | ICD-10-CM | POA: Diagnosis not present

## 2018-01-10 DIAGNOSIS — I208 Other forms of angina pectoris: Secondary | ICD-10-CM | POA: Diagnosis not present

## 2018-01-10 DIAGNOSIS — E119 Type 2 diabetes mellitus without complications: Secondary | ICD-10-CM | POA: Diagnosis not present

## 2018-01-16 DIAGNOSIS — J449 Chronic obstructive pulmonary disease, unspecified: Secondary | ICD-10-CM | POA: Diagnosis not present

## 2018-01-16 DIAGNOSIS — K220 Achalasia of cardia: Secondary | ICD-10-CM | POA: Diagnosis not present

## 2018-01-16 DIAGNOSIS — G4733 Obstructive sleep apnea (adult) (pediatric): Secondary | ICD-10-CM | POA: Diagnosis not present

## 2018-01-16 DIAGNOSIS — R911 Solitary pulmonary nodule: Secondary | ICD-10-CM | POA: Diagnosis not present

## 2018-02-15 DIAGNOSIS — M79671 Pain in right foot: Secondary | ICD-10-CM | POA: Diagnosis not present

## 2018-02-15 DIAGNOSIS — M79672 Pain in left foot: Secondary | ICD-10-CM | POA: Diagnosis not present

## 2018-02-15 DIAGNOSIS — B351 Tinea unguium: Secondary | ICD-10-CM | POA: Diagnosis not present

## 2018-03-05 ENCOUNTER — Ambulatory Visit (INDEPENDENT_AMBULATORY_CARE_PROVIDER_SITE_OTHER): Payer: PPO | Admitting: Vascular Surgery

## 2018-03-05 ENCOUNTER — Other Ambulatory Visit (INDEPENDENT_AMBULATORY_CARE_PROVIDER_SITE_OTHER): Payer: PPO

## 2018-03-16 ENCOUNTER — Other Ambulatory Visit (INDEPENDENT_AMBULATORY_CARE_PROVIDER_SITE_OTHER): Payer: Self-pay | Admitting: Vascular Surgery

## 2018-03-16 DIAGNOSIS — I714 Abdominal aortic aneurysm, without rupture, unspecified: Secondary | ICD-10-CM

## 2018-03-20 ENCOUNTER — Ambulatory Visit (INDEPENDENT_AMBULATORY_CARE_PROVIDER_SITE_OTHER): Payer: PPO

## 2018-03-20 ENCOUNTER — Encounter (INDEPENDENT_AMBULATORY_CARE_PROVIDER_SITE_OTHER): Payer: Self-pay | Admitting: Vascular Surgery

## 2018-03-20 ENCOUNTER — Ambulatory Visit (INDEPENDENT_AMBULATORY_CARE_PROVIDER_SITE_OTHER): Payer: PPO | Admitting: Vascular Surgery

## 2018-03-20 VITALS — BP 159/82 | HR 59 | Resp 13 | Ht 70.0 in | Wt 195.0 lb

## 2018-03-20 DIAGNOSIS — E119 Type 2 diabetes mellitus without complications: Secondary | ICD-10-CM | POA: Diagnosis not present

## 2018-03-20 DIAGNOSIS — I714 Abdominal aortic aneurysm, without rupture, unspecified: Secondary | ICD-10-CM

## 2018-03-20 DIAGNOSIS — I10 Essential (primary) hypertension: Secondary | ICD-10-CM | POA: Diagnosis not present

## 2018-03-20 NOTE — Progress Notes (Signed)
Subjective:    Patient ID: Bryan Strong, male    DOB: May 02, 1946, 72 y.o.   MRN: [id number] Chief Complaint  Patient presents with  . Follow-up    1 year AAA   Patient presents for yearly AAA follow-up.  The patient presents without complaint.  The patient underwent a abdominal aorta study which was notable for a AAA measuring 3.2cm x 3.0cm (previous 2.91 cm x 3.08 cm).  Left common iliac artery 1.4 cm x 1.4 cm.  Left common iliac artery 1.0 cm x 0.9 cm.  Triphasic waveforms noted throughout.  The patient denies any abdominal pain, back pain or thrombosis to the bilateral lower extremity.  Patient denies any claudication-like symptoms, rest pain or ulceration to the bilateral lower extremity.  The patient denies any fever, nausea vomiting.  Review of Systems  Constitutional: Negative.   HENT: Negative.   Eyes: Negative.   Respiratory: Negative.   Cardiovascular:       AAA  Gastrointestinal: Negative.   Endocrine: Negative.   Genitourinary: Negative.   Musculoskeletal: Negative.   Skin: Negative.   Allergic/Immunologic: Negative.   Neurological: Negative.   Hematological: Negative.   Psychiatric/Behavioral: Negative.       Objective:   Physical Exam  Constitutional: He is oriented to person, place, and time. He appears well-developed and well-nourished. No distress.  HENT:  Head: Normocephalic and atraumatic.  Eyes: Pupils are equal, round, and reactive to light. Conjunctivae are normal.  Neck: Normal range of motion.  Cardiovascular: Normal rate, regular rhythm, normal heart sounds and intact distal pulses.  Pulses:      Radial pulses are 2+ on the right side, and 2+ on the left side.       Dorsalis pedis pulses are 2+ on the right side, and 2+ on the left side.       Posterior tibial pulses are 2+ on the right side, and 2+ on the left side.  Pulmonary/Chest: Effort normal and breath sounds normal.  Abdominal: Soft. Bowel sounds are normal. He exhibits no distension. There  is no tenderness.  Musculoskeletal: Normal range of motion. He exhibits no edema.  Neurological: He is alert and oriented to person, place, and time.  Skin: Skin is warm and dry. He is not diaphoretic.  Psychiatric: He has a normal mood and affect. His behavior is normal. Judgment and thought content normal.  Vitals reviewed.  BP (!) 159/82 (BP Location: Right Arm, Patient Position: Sitting)   Pulse (!) 59   Resp 13   Ht 5\' 10"  (1.778 m)   Wt 195 lb (88.5 kg)   BMI 27.98 kg/m   Past Medical History:  Diagnosis Date  . A-fib ([place])   . Achalasia of esophagus   . Allergic rhinitis   . Anemia   . Anginal pain ([place])   . Aortic aneurysm ([place])    followed by Dr. Franchot Gallo  . Coronary artery disease   . Diabetes mellitus without complication ([place])   . Dyspnea   . Emphysema of lung ([place])   . GERD (gastroesophageal reflux disease)   . Heart murmur   . Hypercholesteremia   . Hypertension   . Kidney stones   . Skin cancer   . Sleep apnea    CPAP  . Wears dentures    full upper and lower   Social History   Socioeconomic History  . Marital status: Married    Spouse name: Not on file  . Number of children: Not on file  .  Years of education: Not on file  . Highest education level: Not on file  Occupational History  . Not on file  Social Needs  . Financial resource strain: Not on file  . Food insecurity:    Worry: Not on file    Inability: Not on file  . Transportation needs:    Medical: Not on file    Non-medical: Not on file  Tobacco Use  . Smoking status: Former Smoker    Packs/day: 1.00    Years: 56.00    Pack years: 56.00    Types: Cigarettes  . Smokeless tobacco: Never Used  Substance and Sexual Activity  . Alcohol use: No  . Drug use: No  . Sexual activity: Not on file  Lifestyle  . Physical activity:    Days per week: Not on file    Minutes per session: Not on file  . Stress: Not on file  Relationships  . Social connections:    Talks on phone: Not on  file    Gets together: Not on file    Attends religious service: Not on file    Active member of club or organization: Not on file    Attends meetings of clubs or organizations: Not on file    Relationship status: Not on file  . Intimate partner violence:    Fear of current or ex partner: Not on file    Emotionally abused: Not on file    Physically abused: Not on file    Forced sexual activity: Not on file  Other Topics Concern  . Not on file  Social History Narrative  . Not on file   Past Surgical History:  Procedure Laterality Date  . CHOLECYSTECTOMY    . colon polyps    . COLONOSCOPY WITH PROPOFOL N/A 09/18/2017   Procedure: COLONOSCOPY WITH PROPOFOL;  Surgeon: Manya Silvas, MD;  Location: [facility] ENDOSCOPY;  Service: Endoscopy;  Laterality: N/A;  . CORONARY ANGIOPLASTY WITH STENT PLACEMENT  01/03/11   [facility]  Dr. Saralyn Pilar  . ECTROPION REPAIR Bilateral 05/03/2016   Procedure: REPAIR OF ECTROPION BILATERAL LOWER EYELIDS;  Surgeon: Karle Starch, MD;  Location: [place];  Service: Ophthalmology;  Laterality: Bilateral;  . ESOPHAGOGASTRODUODENOSCOPY (EGD) WITH PROPOFOL N/A 07/21/2017   Procedure: ESOPHAGOGASTRODUODENOSCOPY (EGD) WITH PROPOFOL;  Surgeon: Manya Silvas, MD;  Location: [facility] ENDOSCOPY;  Service: Endoscopy;  Laterality: N/A;  . ESOPHAGOGASTRODUODENOSCOPY (EGD) WITH PROPOFOL Bilateral 10/18/2017   Procedure: ESOPHAGOGASTRODUODENOSCOPY (EGD) WITH PROPOFOL;  Surgeon: Manya Silvas, MD;  Location: [facility] ENDOSCOPY;  Service: Endoscopy;  Laterality: Bilateral;  . HERNIA REPAIR    . LEFT HEART CATH Left 06/08/2017   Procedure: Left Heart Cath and Coronary Angiography;  Surgeon: Yolonda Kida, MD;  Location: [place] CV LAB;  Service: Cardiovascular;  Laterality: Left;  . LID LESION EXCISION Bilateral 05/03/2016   Procedure: ,EXCISION CYST RIGHT UPPER EYELID;  Surgeon: Karle Starch, MD;  Location: [place];  Service: Ophthalmology;  Laterality:  Bilateral;  Diabetic - oral meds CPAP  . NASAL SEPTUM SURGERY  1967  . SAVORY DILATION N/A 07/21/2017   Procedure: SAVORY DILATION;  Surgeon: Manya Silvas, MD;  Location: [facility] ENDOSCOPY;  Service: Endoscopy;  Laterality: N/A;   History reviewed. No pertinent family history.  Allergies  Allergen Reactions  . Other Rash    ERYTHROMYCIN OINTMENT "some nerve pill, caused HTN"      Assessment & Plan:  Patient presents for yearly AAA follow-up.  The patient presents without  complaint.  The patient underwent a abdominal aorta study which was notable for a AAA measuring 3.2cm x 3.0cm (previous 2.91 cm x 3.08 cm).  Left common iliac artery 1.4 cm x 1.4 cm.  Left common iliac artery 1.0 cm x 0.9 cm.  Triphasic waveforms noted throughout.  The patient denies any abdominal pain, back pain or thrombosis to the bilateral lower extremity.  Patient denies any claudication-like symptoms, rest pain or ulceration to the bilateral lower extremity.  The patient denies any fever, nausea vomiting.  1. AAA (abdominal aortic aneurysm) without rupture (HCC) - Stable Studies reviewed with patient. Duplex stable, physical exam unremarkable.  No surgery or intervention at this time. The patient to follow up in one year with an aortic duplex. The patient has an asymptomatic abdominal aortic aneurysm that is less than 4 cm in maximal diameter.  I have reviewed the natural history of abdominal aortic aneurysm and the small risk of rupture for aneurysm less than 5 cm in size.  However, as these small aneurysms tend to enlarge over time, continued surveillance with ultrasound or CT scan is mandatory.  The patient's blood pressure is being adequately controlled however I have reviewed the importance of hypertension and lipid control and the importance of continuing his abstinence from tobacco.  The patient is also encouraged to exercise a minimum of 30 minutes 4 times a week.  Should the patient develop new onset abdominal  or back pain or signs of peripheral embolization they are instructed to seek medical attention immediately and to alert the physician providing care that they have an aneurysm.  The patient voices their understanding.  - VAS [place] AAA DUPLEX; Future  2. Essential hypertension - Stable Encouraged good control as its slows the progression of atherosclerotic disease  3. Type 2 diabetes mellitus without complication, without long-term current use of insulin (HCC) - Stable Encouraged good control as its slows the progression of atherosclerotic disease  Current Outpatient Medications on File Prior to Visit  Medication Sig Dispense Refill  . ascorbic acid (VITAMIN C) 500 MG tablet Take 500 mg by mouth daily.    Marland Kitchen aspirin 325 MG tablet Take 325 mg by mouth daily.    Marland Kitchen azelastine (ASTELIN) 0.1 % nasal spray Place 1 spray into both nostrils 2 (two) times daily. Use in each nostril as directed     . calcium carbonate (OS-CAL) 1250 (500 Ca) MG chewable tablet Chew by mouth.    . clopidogrel (PLAVIX) 75 MG tablet Take 75 mg by mouth daily.    . fexofenadine (ALLEGRA) 180 MG tablet Take 180 mg by mouth daily.    . Flaxseed, Linseed, (FLAXSEED OIL PO) Take 1 tablet by mouth 2 (two) times daily.     . fluticasone (FLONASE) 50 MCG/ACT nasal spray Place 1 spray into both nostrils 2 (two) times daily.     Marland Kitchen lisinopril (PRINIVIL,ZESTRIL) 10 MG tablet Take 10 mg by mouth 2 (two) times daily.    . metFORMIN (GLUCOPHAGE) 1000 MG tablet Take 1,000 mg by mouth 2 (two) times daily with a meal.    . metoprolol tartrate (LOPRESSOR) 25 MG tablet Take 12.5 mg by mouth 2 (two) times daily.    . Multiple Vitamin (MULTIVITAMIN WITH MINERALS) TABS tablet Take 1 tablet by mouth daily.    Marland Kitchen olopatadine (PATANOL) 0.1 % ophthalmic solution Place 1 drop into both eyes 2 (two) times daily.    . pantoprazole (PROTONIX) 40 MG tablet Take 40 mg by mouth daily before breakfast.     .  Polyethyl Glycol-Propyl Glycol (SYSTANE ULTRA OP)  Apply 2 drops to eye 5 (five) times daily as needed (dry eyes).    . ranitidine (ZANTAC) 300 MG tablet     . simvastatin (ZOCOR) 40 MG tablet Take 40 mg by mouth every evening.     . sucralfate (CARAFATE) 1 g tablet DISSOLVE ONE TABLET IN ONE OUNCE OF WATER AND TAKE THREE TIMES PER DAY 30 MINUTES BEFORE MEALS    . isosorbide mononitrate (IMDUR) 60 MG 24 hr tablet Take 60 mg by mouth daily at 3 pm.    . ranolazine (RANEXA) 500 MG 12 hr tablet Take 500 mg by mouth 2 (two) times daily.     No current facility-administered medications on file prior to visit.    There are no Patient Instructions on file for this visit. No follow-ups on file.  KIMBERLY A STEGMAYER, PA-C

## 2018-04-10 DIAGNOSIS — E119 Type 2 diabetes mellitus without complications: Secondary | ICD-10-CM | POA: Diagnosis not present

## 2018-04-10 DIAGNOSIS — H04123 Dry eye syndrome of bilateral lacrimal glands: Secondary | ICD-10-CM | POA: Diagnosis not present

## 2018-04-10 DIAGNOSIS — H43813 Vitreous degeneration, bilateral: Secondary | ICD-10-CM | POA: Diagnosis not present

## 2018-04-10 DIAGNOSIS — H5213 Myopia, bilateral: Secondary | ICD-10-CM | POA: Diagnosis not present

## 2018-04-10 DIAGNOSIS — H2513 Age-related nuclear cataract, bilateral: Secondary | ICD-10-CM | POA: Diagnosis not present

## 2018-04-10 DIAGNOSIS — H52223 Regular astigmatism, bilateral: Secondary | ICD-10-CM | POA: Diagnosis not present

## 2018-04-10 DIAGNOSIS — H0289 Other specified disorders of eyelid: Secondary | ICD-10-CM | POA: Diagnosis not present

## 2018-04-10 DIAGNOSIS — I10 Essential (primary) hypertension: Secondary | ICD-10-CM | POA: Diagnosis not present

## 2018-05-14 ENCOUNTER — Encounter: Payer: Self-pay | Admitting: Emergency Medicine

## 2018-05-14 ENCOUNTER — Other Ambulatory Visit: Payer: Self-pay

## 2018-05-14 ENCOUNTER — Emergency Department: Payer: PPO

## 2018-05-14 ENCOUNTER — Emergency Department
Admission: EM | Admit: 2018-05-14 | Discharge: 2018-05-14 | Disposition: A | Discharge status: Home / Self Care | Payer: PPO | Attending: Emergency Medicine | Admitting: Emergency Medicine

## 2018-05-14 DIAGNOSIS — E119 Type 2 diabetes mellitus without complications: Secondary | ICD-10-CM | POA: Diagnosis not present

## 2018-05-14 DIAGNOSIS — Y939 Activity, unspecified: Secondary | ICD-10-CM | POA: Insufficient documentation

## 2018-05-14 DIAGNOSIS — I2510 Atherosclerotic heart disease of native coronary artery without angina pectoris: Secondary | ICD-10-CM | POA: Insufficient documentation

## 2018-05-14 DIAGNOSIS — R55 Syncope and collapse: Secondary | ICD-10-CM | POA: Diagnosis not present

## 2018-05-14 DIAGNOSIS — I10 Essential (primary) hypertension: Secondary | ICD-10-CM | POA: Insufficient documentation

## 2018-05-14 DIAGNOSIS — Y998 Other external cause status: Secondary | ICD-10-CM | POA: Diagnosis not present

## 2018-05-14 DIAGNOSIS — Z7982 Long term (current) use of aspirin: Secondary | ICD-10-CM | POA: Diagnosis not present

## 2018-05-14 DIAGNOSIS — S0083XA Contusion of other part of head, initial encounter: Secondary | ICD-10-CM

## 2018-05-14 DIAGNOSIS — S01511A Laceration without foreign body of lip, initial encounter: Secondary | ICD-10-CM | POA: Diagnosis not present

## 2018-05-14 DIAGNOSIS — Z7902 Long term (current) use of antithrombotics/antiplatelets: Secondary | ICD-10-CM | POA: Insufficient documentation

## 2018-05-14 DIAGNOSIS — W010XXA Fall on same level from slipping, tripping and stumbling without subsequent striking against object, initial encounter: Secondary | ICD-10-CM | POA: Diagnosis not present

## 2018-05-14 DIAGNOSIS — Y929 Unspecified place or not applicable: Secondary | ICD-10-CM | POA: Diagnosis not present

## 2018-05-14 DIAGNOSIS — E7800 Pure hypercholesterolemia, unspecified: Secondary | ICD-10-CM | POA: Insufficient documentation

## 2018-05-14 DIAGNOSIS — Z7984 Long term (current) use of oral hypoglycemic drugs: Secondary | ICD-10-CM | POA: Insufficient documentation

## 2018-05-14 DIAGNOSIS — Z87891 Personal history of nicotine dependence: Secondary | ICD-10-CM | POA: Insufficient documentation

## 2018-05-14 DIAGNOSIS — Z79899 Other long term (current) drug therapy: Secondary | ICD-10-CM | POA: Insufficient documentation

## 2018-05-14 DIAGNOSIS — S0012XA Contusion of left eyelid and periocular area, initial encounter: Secondary | ICD-10-CM | POA: Diagnosis not present

## 2018-05-14 LAB — CBC
HEMATOCRIT: 42.5 % (ref 40.0–52.0)
HEMOGLOBIN: 14.8 g/dL (ref 13.0–18.0)
MCH: 31.8 pg (ref 26.0–34.0)
MCHC: 34.7 g/dL (ref 32.0–36.0)
MCV: 91.7 fL (ref 80.0–100.0)
PLATELETS: 125 10*3/uL — AB (ref 150–440)
RBC: 4.64 MIL/uL (ref 4.40–5.90)
RDW: 13.8 % (ref 11.5–14.5)
WBC: 5.2 10*3/uL (ref 3.8–10.6)

## 2018-05-14 LAB — BASIC METABOLIC PANEL
ANION GAP: 10 (ref 5–15)
BUN: 15 mg/dL (ref 6–20)
CO2: 26 mmol/L (ref 22–32)
Calcium: 9.5 mg/dL (ref 8.9–10.3)
Chloride: 96 mmol/L — ABNORMAL LOW (ref 101–111)
Creatinine, Ser: 1.02 mg/dL (ref 0.61–1.24)
GFR calc Af Amer: >60 mL/min (ref >60)
GLUCOSE: 451 mg/dL — AB (ref 65–99)
POTASSIUM: 4.4 mmol/L (ref 3.5–5.1)
Sodium: 132 mmol/L — ABNORMAL LOW (ref 135–145)

## 2018-05-14 LAB — URINALYSIS, COMPLETE (UACMP) WITH MICROSCOPIC
Bacteria, UA: NONE SEEN
Bilirubin Urine: NEGATIVE
HGB URINE DIPSTICK: NEGATIVE
KETONES UR: NEGATIVE mg/dL
LEUKOCYTES UA: NEGATIVE
Nitrite: NEGATIVE
PROTEIN: NEGATIVE mg/dL
SQUAMOUS EPITHELIAL / LPF: NONE SEEN (ref 0–5)
Specific Gravity, Urine: 1.006 (ref 1.005–1.030)
pH: 7 (ref 5.0–8.0)

## 2018-05-14 LAB — TROPONIN I

## 2018-05-14 MED ORDER — LIDOCAINE HCL (PF) 1 % IJ SOLN
INTRAMUSCULAR | Status: AC
  Filled 2018-05-14: qty 5

## 2018-05-14 MED ORDER — SODIUM CHLORIDE 0.9 % IV BOLUS
500.0000 mL | Freq: Once | INTRAVENOUS | Status: AC
  Administered 2018-05-14: 500 mL via INTRAVENOUS

## 2018-05-14 MED ORDER — INSULIN ASPART 100 UNIT/ML ~~LOC~~ SOLN
10.0000 [IU] | Freq: Once | SUBCUTANEOUS | Status: AC
Start: 2018-05-14 — End: 2018-05-14
  Administered 2018-05-14: 10 [IU] via INTRAVENOUS
  Filled 2018-05-14: qty 1

## 2018-05-14 NOTE — ED Notes (Signed)
Pt returned from ct scan

## 2018-05-14 NOTE — ED Provider Notes (Signed)
[facility] Emergency Department Provider Note ____________________________________________   First MD Initiated Contact with Patient 05/14/18 1120     (approximate)  I have reviewed the triage vital signs and the nursing notes.   HISTORY  Chief Complaint Fall and Weakness    HPI Bryan Strong is a 72 y.o. male with PMH as noted below who presents with facial injury, acute onset within the last hour, after the patient syncopized.  Patient states that he had just gotten up and was recounting a bad dream to his wife, when he felt lightheaded and passed out.  Patient hit his face on the ground.  He states that he now feels back to his baseline.  He denies feeling weak in the last few days or having any other symptoms recently.  Past Medical History:  Diagnosis Date  . A-fib ([place])   . Achalasia of esophagus   . Allergic rhinitis   . Anemia   . Anginal pain ([place])   . Aortic aneurysm ([place])    followed by Dr. Franchot Gallo  . Coronary artery disease   . Diabetes mellitus without complication ([place])   . Dyspnea   . Emphysema of lung ([place])   . GERD (gastroesophageal reflux disease)   . Heart murmur   . Hypercholesteremia   . Hypertension   . Kidney stones   . Skin cancer   . Sleep apnea    CPAP  . Wears dentures    full upper and lower    Patient Active Problem List   Diagnosis Date Noted  . AAA (abdominal aortic aneurysm) without rupture ([place]) 02/27/2017  . Essential hypertension 02/27/2017  . Diabetes ([place]) 02/27/2017  . Pure hypercholesterolemia 02/27/2017    Past Surgical History:  Procedure Laterality Date  . CHOLECYSTECTOMY    . colon polyps    . COLONOSCOPY WITH PROPOFOL N/A 09/18/2017   Procedure: COLONOSCOPY WITH PROPOFOL;  Surgeon: Manya Silvas, MD;  Location: [facility] ENDOSCOPY;  Service: Endoscopy;  Laterality: N/A;  . CORONARY ANGIOPLASTY WITH STENT PLACEMENT  01/03/11   [facility]  Dr. Saralyn Pilar  . ECTROPION REPAIR Bilateral 05/03/2016   Procedure: REPAIR OF ECTROPION BILATERAL LOWER EYELIDS;  Surgeon: Karle Starch, MD;  Location: [place];  Service: Ophthalmology;  Laterality: Bilateral;  . ESOPHAGOGASTRODUODENOSCOPY (EGD) WITH PROPOFOL N/A 07/21/2017   Procedure: ESOPHAGOGASTRODUODENOSCOPY (EGD) WITH PROPOFOL;  Surgeon: Manya Silvas, MD;  Location: [facility] ENDOSCOPY;  Service: Endoscopy;  Laterality: N/A;  . ESOPHAGOGASTRODUODENOSCOPY (EGD) WITH PROPOFOL Bilateral 10/18/2017   Procedure: ESOPHAGOGASTRODUODENOSCOPY (EGD) WITH PROPOFOL;  Surgeon: Manya Silvas, MD;  Location: [facility] ENDOSCOPY;  Service: Endoscopy;  Laterality: Bilateral;  . HERNIA REPAIR    . LEFT HEART CATH Left 06/08/2017   Procedure: Left Heart Cath and Coronary Angiography;  Surgeon: Yolonda Kida, MD;  Location: [place] CV LAB;  Service: Cardiovascular;  Laterality: Left;  . LID LESION EXCISION Bilateral 05/03/2016   Procedure: ,EXCISION CYST RIGHT UPPER EYELID;  Surgeon: Karle Starch, MD;  Location: [place];  Service: Ophthalmology;  Laterality: Bilateral;  Diabetic - oral meds CPAP  . NASAL SEPTUM SURGERY  1967  . SAVORY DILATION N/A 07/21/2017   Procedure: SAVORY DILATION;  Surgeon: Manya Silvas, MD;  Location: [facility] ENDOSCOPY;  Service: Endoscopy;  Laterality: N/A;    Prior to Admission medications   Medication Sig Start Date End Date Taking? Authorizing Provider  ascorbic acid (VITAMIN C) 500 MG tablet Take 500 mg by mouth daily.  [provider]  aspirin 325 MG tablet Take 325 mg by mouth daily.    [provider]  azelastine (ASTELIN) 0.1 % nasal spray Place 1 spray into both nostrils 2 (two) times daily. Use in each nostril as directed     [provider]  calcium carbonate (OS-CAL) 1250 (500 Ca) MG chewable tablet Chew by mouth.    [provider]  clopidogrel (PLAVIX) 75 MG tablet Take 75 mg by mouth daily.    [provider]  fexofenadine (ALLEGRA) 180 MG tablet  Take 180 mg by mouth daily.    [provider]  Flaxseed, Linseed, (FLAXSEED OIL PO) Take 1 tablet by mouth 2 (two) times daily.     [provider]  fluticasone (FLONASE) 50 MCG/ACT nasal spray Place 1 spray into both nostrils 2 (two) times daily.     [provider]  isosorbide mononitrate (IMDUR) 60 MG 24 hr tablet Take 60 mg by mouth daily at 3 pm.    [provider]  lisinopril (PRINIVIL,ZESTRIL) 10 MG tablet Take 10 mg by mouth 2 (two) times daily.    [provider]  metFORMIN (GLUCOPHAGE) 1000 MG tablet Take 1,000 mg by mouth 2 (two) times daily with a meal.    [provider]  metoprolol tartrate (LOPRESSOR) 25 MG tablet Take 12.5 mg by mouth 2 (two) times daily.    [provider]  Multiple Vitamin (MULTIVITAMIN WITH MINERALS) TABS tablet Take 1 tablet by mouth daily.    [provider]  olopatadine (PATANOL) 0.1 % ophthalmic solution Place 1 drop into both eyes 2 (two) times daily.    [provider]  pantoprazole (PROTONIX) 40 MG tablet Take 40 mg by mouth daily before breakfast.     [provider]  Polyethyl Glycol-Propyl Glycol (SYSTANE ULTRA OP) Apply 2 drops to eye 5 (five) times daily as needed (dry eyes).    [provider]  ranitidine (ZANTAC) 300 MG tablet  01/18/18   [provider]  ranolazine (RANEXA) 500 MG 12 hr tablet Take 500 mg by mouth 2 (two) times daily.    [provider]  simvastatin (ZOCOR) 40 MG tablet Take 40 mg by mouth every evening.     [provider]  sucralfate (CARAFATE) 1 g tablet DISSOLVE ONE TABLET IN ONE OUNCE OF WATER AND TAKE THREE TIMES PER DAY 30 MINUTES BEFORE MEALS 11/14/17   [provider]    Allergies Other  No family history on file.  Social History Social History   Tobacco Use  . Smoking status: Former Smoker    Packs/day: 1.00    Years: 56.00    Pack years: 56.00    Types: Cigarettes  .  Smokeless tobacco: Never Used  Substance Use Topics  . Alcohol use: No  . Drug use: No    Review of Systems  Constitutional: No fever.  No weakness. Eyes: No visual changes. ENT: No neck pain. Cardiovascular: Denies chest pain. Respiratory: Denies shortness of breath. Gastrointestinal: No n vomiting. Genitourinary: Negative for dysuria.  Musculoskeletal: Negative for back pain. Skin: Positive for facial laceration. Neurological: Positive for mild headache.   ____________________________________________   PHYSICAL EXAM:  VITAL SIGNS: ED Triage Vitals  Enc Vitals Group     BP 05/14/18 1104 (!) 161/88     Pulse Rate 05/14/18 1104 91     Resp 05/14/18 1104 16     Temp 05/14/18 1104 (!) 97 F (36.1 C)  Temp Source 05/14/18 1104 Axillary     SpO2 05/14/18 1104 100 %     Weight 05/14/18 1106 195 lb (88.5 kg)     Height --      Head Circumference --      Peak Flow --      Pain Score 05/14/18 1106 0     Pain Loc --      Pain Edu? --      Excl. in [place]? --     Constitutional: Alert and oriented.  Relatively well appearing and in no acute distress. Eyes: Conjunctivae are normal.  EOMI.  PERRLA.  Left periorbital swelling. Head: Left maxillary swelling. Nose: No congestion/rhinnorhea. Mouth/Throat: Mucous membranes are moist.  Left upper lip 2 cm laceration (external only, not through and through.) Neck: Normal range of motion.  No midline cervical spinal tenderness. Cardiovascular: Normal rate, regular rhythm. Grossly normal heart sounds.  Good peripheral circulation. Respiratory: Normal respiratory effort.  No retractions. Lungs CTAB. Gastrointestinal: Soft and nontender. No distention.  Genitourinary: No flank tenderness. Musculoskeletal: No lower extremity edema.  Extremities warm and well perfused.  Neurologic:  Normal speech and language.  Motor and sensory intact in all extremities.  Normal coordination with no ataxia.  No gross focal neurologic deficits are  appreciated.  Skin:  Skin is warm and dry. No rash noted. Psychiatric: Mood and affect are normal. Speech and behavior are normal.  ____________________________________________   LABS (all labs ordered are listed, but only abnormal results are displayed)  Labs Reviewed  BASIC METABOLIC PANEL - Abnormal; Notable for the following components:      Result Value   Sodium 132 (*)    Chloride 96 (*)    Glucose, Bld 451 (*)    All other components within normal limits  CBC - Abnormal; Notable for the following components:   Platelets 125 (*)    All other components within normal limits  URINALYSIS, COMPLETE (UACMP) WITH MICROSCOPIC - Abnormal; Notable for the following components:   Color, Urine STRAW (*)    APPearance CLEAR (*)    Glucose, UA >=500 (*)    All other components within normal limits  TROPONIN I  CBG MONITORING, ED   ____________________________________________  EKG  ED ECG REPORT I, Arta Silence, the attending physician, personally viewed and interpreted this ECG.  Date: 05/14/2018 EKG Time: 1103 Rate: 91 Rhythm: normal sinus rhythm QRS Axis: normal Intervals: Borderline prolonged PR ST/T Wave abnormalities: normal Narrative Interpretation: no evidence of acute ischemia  ____________________________________________  RADIOLOGY  CT head: No ICH or other acute findings CT maxillofacial: No acute fracture  ____________________________________________   PROCEDURES  Procedure(s) performed: Yes  .Marland KitchenLaceration Repair Date/Time: 05/14/2018 4:48 PM Performed by: Arta Silence, MD Authorized by: Arta Silence, MD   Consent:    Consent obtained:  Verbal   Consent given by:  Patient Laceration details:    Location:  Lip   Lip location:  Upper exterior lip   Length (cm):  3 Repair type:    Repair type:  Complex Exploration:    Wound exploration: entire depth of wound probed and visualized     Contaminated: no   Treatment:    Area  cleansed with:  Betadine   Amount of cleaning:  Standard Subcutaneous repair:    Suture size:  4-0   Suture material:  Vicryl   Suture technique:  Vertical mattress   Number of sutures:  1 Skin repair:    Repair method:  Sutures   Suture size:  5-0   Suture material:  Nylon   Suture technique:  Simple interrupted Approximation:    Approximation:  Close   [place] border: well-aligned   Post-procedure details:    Dressing:  Sterile dressing   Patient tolerance of procedure:  Tolerated well, no immediate complications    Critical Care performed: No ____________________________________________   INITIAL IMPRESSION / ASSESSMENT AND PLAN / ED COURSE  Pertinent labs & imaging results that were available during my care of the patient were reviewed by me and considered in my medical decision making (see chart for details).  72 year old male with PMH as noted above presents with facial injury after a syncope with prodrome of lightheadedness.  He has been feeling in his usual state of health until this morning.  On exam, vital signs are normal except for hypertension, and the remainder the exam is as described above.  Neuro exam is nonfocal.  Patient is on Plavix and aspirin.  Patient has maxillofacial swelling.  Will obtain CT head and maxillofacial to rule out acute trauma.  No clinical evidence of cervical spinal injury.  Differential for the syncope includes vasovagal, orthostatic, dehydration, other metabolic cause, UTI, or less likely cardiac cause.  Will obtain labs to evaluate for these causes and give fluid bolus.      ----------------------------------------- 4:49 PM on 05/14/2018 -----------------------------------------  The patient's lab work-up showed no concerning acute findings.  He is feeling better after fluids.  Imaging was negative for acute fracture.  The lacerations been repaired.  At this time, the patient is stable for discharge home.  Return precautions given,  and the patient expresses understanding.  ____________________________________________   FINAL CLINICAL IMPRESSION(S) / ED DIAGNOSES  Final diagnoses:  Lip laceration, initial encounter  Contusion of face, initial encounter  Syncope, unspecified syncope type      NEW MEDICATIONS STARTED DURING THIS VISIT:  Discharge Medication List as of 05/14/2018  4:24 PM       Note:  This document was prepared using Dragon voice recognition software and may include unintentional dictation errors.    Arta Silence, MD 05/14/18 1651

## 2018-05-14 NOTE — ED Notes (Signed)
Pt with noted large amount swelling to left eye and left cheek. No laceration noted to eye.  Also with noted laceration to upper lip approx 1 inch through entire lip.  Pt alert and oriented at this time. NSR on monitor. HR 86.

## 2018-05-14 NOTE — Discharge Instructions (Addendum)
Take your regular medications including her diabetes medications as prescribed, and to monitor your sugars closely.  Return to your primary care doctor or here in 5 to 7 days to have the stitches removed.  Return to the ER immediately for new, worsening, or recurrent dizziness, weakness, passing out, severe headache, or any other new or worsening symptoms that concern you.

## 2018-05-14 NOTE — ED Triage Notes (Signed)
Pt to ed via ems from home after having a syncopal episode that was witnessed by wife.  Pt fell and hit head laceration noted to upper lip approx 1 inch through and through.  Per pt, his dentures broke when he fell.  Pt is on a blood thinner.  Pt reports he was standing in kitchen with wife and felt dizzy.  Reports "I felt a funny feeling in my chest and then I passed out".  Pt has hx of a fib.  Pt alert and oriented at this time.

## 2018-05-17 DIAGNOSIS — M79672 Pain in left foot: Secondary | ICD-10-CM | POA: Diagnosis not present

## 2018-05-17 DIAGNOSIS — M79671 Pain in right foot: Secondary | ICD-10-CM | POA: Diagnosis not present

## 2018-05-17 DIAGNOSIS — E119 Type 2 diabetes mellitus without complications: Secondary | ICD-10-CM | POA: Diagnosis not present

## 2018-05-17 DIAGNOSIS — B351 Tinea unguium: Secondary | ICD-10-CM | POA: Diagnosis not present

## 2018-05-17 DIAGNOSIS — D2370 Other benign neoplasm of skin of unspecified lower limb, including hip: Secondary | ICD-10-CM | POA: Diagnosis not present

## 2018-05-21 DIAGNOSIS — H43813 Vitreous degeneration, bilateral: Secondary | ICD-10-CM | POA: Diagnosis not present

## 2018-05-21 DIAGNOSIS — H5213 Myopia, bilateral: Secondary | ICD-10-CM | POA: Diagnosis not present

## 2018-05-21 DIAGNOSIS — H04123 Dry eye syndrome of bilateral lacrimal glands: Secondary | ICD-10-CM | POA: Diagnosis not present

## 2018-05-21 DIAGNOSIS — H52223 Regular astigmatism, bilateral: Secondary | ICD-10-CM | POA: Diagnosis not present

## 2018-05-21 DIAGNOSIS — I10 Essential (primary) hypertension: Secondary | ICD-10-CM | POA: Diagnosis not present

## 2018-05-21 DIAGNOSIS — R233 Spontaneous ecchymoses: Secondary | ICD-10-CM | POA: Diagnosis not present

## 2018-05-21 DIAGNOSIS — S0181XD Laceration without foreign body of other part of head, subsequent encounter: Secondary | ICD-10-CM | POA: Diagnosis not present

## 2018-05-21 DIAGNOSIS — E119 Type 2 diabetes mellitus without complications: Secondary | ICD-10-CM | POA: Diagnosis not present

## 2018-05-21 DIAGNOSIS — H1132 Conjunctival hemorrhage, left eye: Secondary | ICD-10-CM | POA: Diagnosis not present

## 2018-05-21 DIAGNOSIS — H2513 Age-related nuclear cataract, bilateral: Secondary | ICD-10-CM | POA: Diagnosis not present

## 2018-05-21 DIAGNOSIS — R55 Syncope and collapse: Secondary | ICD-10-CM | POA: Diagnosis not present

## 2018-05-21 DIAGNOSIS — H0289 Other specified disorders of eyelid: Secondary | ICD-10-CM | POA: Diagnosis not present

## 2018-05-24 DIAGNOSIS — S0012XA Contusion of left eyelid and periocular area, initial encounter: Secondary | ICD-10-CM | POA: Diagnosis not present

## 2018-06-11 DIAGNOSIS — Z79899 Other long term (current) drug therapy: Secondary | ICD-10-CM | POA: Diagnosis not present

## 2018-06-11 DIAGNOSIS — E7800 Pure hypercholesterolemia, unspecified: Secondary | ICD-10-CM | POA: Diagnosis not present

## 2018-06-11 DIAGNOSIS — E119 Type 2 diabetes mellitus without complications: Secondary | ICD-10-CM | POA: Diagnosis not present

## 2018-06-18 DIAGNOSIS — E7800 Pure hypercholesterolemia, unspecified: Secondary | ICD-10-CM | POA: Diagnosis not present

## 2018-06-18 DIAGNOSIS — E119 Type 2 diabetes mellitus without complications: Secondary | ICD-10-CM | POA: Diagnosis not present

## 2018-06-18 DIAGNOSIS — I10 Essential (primary) hypertension: Secondary | ICD-10-CM | POA: Diagnosis not present

## 2018-06-18 DIAGNOSIS — K219 Gastro-esophageal reflux disease without esophagitis: Secondary | ICD-10-CM | POA: Diagnosis not present

## 2018-06-18 DIAGNOSIS — Z79899 Other long term (current) drug therapy: Secondary | ICD-10-CM | POA: Diagnosis not present

## 2018-06-18 DIAGNOSIS — I2510 Atherosclerotic heart disease of native coronary artery without angina pectoris: Secondary | ICD-10-CM | POA: Diagnosis not present

## 2018-06-27 DIAGNOSIS — I480 Paroxysmal atrial fibrillation: Secondary | ICD-10-CM | POA: Diagnosis not present

## 2018-06-27 DIAGNOSIS — G4733 Obstructive sleep apnea (adult) (pediatric): Secondary | ICD-10-CM | POA: Diagnosis not present

## 2018-06-27 DIAGNOSIS — I10 Essential (primary) hypertension: Secondary | ICD-10-CM | POA: Diagnosis not present

## 2018-06-27 DIAGNOSIS — I208 Other forms of angina pectoris: Secondary | ICD-10-CM | POA: Diagnosis not present

## 2018-06-27 DIAGNOSIS — E119 Type 2 diabetes mellitus without complications: Secondary | ICD-10-CM | POA: Diagnosis not present

## 2018-06-27 DIAGNOSIS — F17200 Nicotine dependence, unspecified, uncomplicated: Secondary | ICD-10-CM | POA: Diagnosis not present

## 2018-06-27 DIAGNOSIS — K220 Achalasia of cardia: Secondary | ICD-10-CM | POA: Diagnosis not present

## 2018-06-27 DIAGNOSIS — R011 Cardiac murmur, unspecified: Secondary | ICD-10-CM | POA: Diagnosis not present

## 2018-06-27 DIAGNOSIS — J449 Chronic obstructive pulmonary disease, unspecified: Secondary | ICD-10-CM | POA: Diagnosis not present

## 2018-06-27 DIAGNOSIS — R0602 Shortness of breath: Secondary | ICD-10-CM | POA: Diagnosis not present

## 2018-06-27 DIAGNOSIS — K219 Gastro-esophageal reflux disease without esophagitis: Secondary | ICD-10-CM | POA: Diagnosis not present

## 2018-07-03 ENCOUNTER — Encounter: Payer: PPO | Attending: Family Medicine | Admitting: *Deleted

## 2018-07-03 ENCOUNTER — Encounter: Payer: Self-pay | Admitting: *Deleted

## 2018-07-03 VITALS — BP 110/70 | Ht 70.0 in | Wt 189.9 lb

## 2018-07-03 DIAGNOSIS — Z713 Dietary counseling and surveillance: Secondary | ICD-10-CM | POA: Diagnosis not present

## 2018-07-03 DIAGNOSIS — Z6827 Body mass index (BMI) 27.0-27.9, adult: Secondary | ICD-10-CM | POA: Insufficient documentation

## 2018-07-03 DIAGNOSIS — E119 Type 2 diabetes mellitus without complications: Secondary | ICD-10-CM | POA: Insufficient documentation

## 2018-07-03 NOTE — Progress Notes (Signed)
Diabetes Self-Management Education  Visit Type: First/Initial  Appt. Start Time: 1515 Appt. End Time: 7824  07/03/2018  [name], identified by name and date of birth, is a 72 y.o. male with a diagnosis of Diabetes: Type 2.   ASSESSMENT  Blood pressure 110/70, height 5\' 10"  (1.778 m), weight 189 lb 14.4 oz (86.1 kg). Body mass index is 27.25 kg/m.  Diabetes Self-Management Education - 07/03/18 1648      Visit Information   Visit Type  First/Initial      Initial Visit   Diabetes Type  Type 2    Are you currently following a meal plan?  Yes    What type of meal plan do you follow?  "decrease sweets and red meat, increase in greens and vegetables"    Are you taking your medications as prescribed?  Yes    Date Diagnosed  10 years ago      Health Coping   How would you rate your overall health?  Good;Fair      Psychosocial Assessment   Patient Belief/Attitude about Diabetes  Motivated to manage diabetes    Self-care barriers  None    Self-management support  Doctor's office;Friends;Family    Other persons present  Spouse/SO    Patient Concerns  Nutrition/Meal planning;Medication;Monitoring;Healthy Lifestyle;Problem Solving;Glycemic Control;Weight Control    Special Needs  None    Preferred Learning Style  Visual;Hands on    Learning Readiness  Change in progress    How often do you need to have someone help you when you read instructions, pamphlets, or other written materials from your doctor or pharmacy?  1 - Never    What is the last grade level you completed in school?  2 years college      Pre-Education Assessment   Patient understands the diabetes disease and treatment process.  Needs Review    Patient understands incorporating nutritional management into lifestyle.  Needs Review    Patient undertands incorporating physical activity into lifestyle.  Needs Review    Patient understands using medications safely.  Needs Review    Patient understands monitoring blood  glucose, interpreting and using results  Needs Review    Patient understands prevention, detection, and treatment of acute complications.  Needs Instruction    Patient understands prevention, detection, and treatment of chronic complications.  Needs Review    Patient understands how to develop strategies to address psychosocial issues.  Needs Instruction    Patient understands how to develop strategies to promote health/change behavior.  Needs Instruction      Complications   Last HgB A1C per patient/outside source  8.7 % 06/11/18    How often do you check your blood sugar?  1-2 times/day    Fasting Blood glucose range (mg/dL)  70-129;130-179 FBG's 122-167 mg/dL    Have you had a dilated eye exam in the past 12 months?  Yes    Have you had a dental exam in the past 12 months?  No dentures    Are you checking your feet?  Yes    How many days per week are you checking your feet?  7 also sees podiatrist every 3 months      Dietary Intake   Breakfast  cereal, almond milk, blueberries, oatmeal    Lunch  occasional hamburger, chili, salads at [employer] 5 x week    Dinner  chicken and fish 1 x week; sweet potatoes, green beans, peas, occasional corn, brown rice, greens, onions, squash  Snack (evening)  multiple after supper - cereal and nuts    Beverage(s)  water, almond milk, coffee, unsweetened tea, diet soda      Exercise   Exercise Type  Light (walking / raking leaves)    How many days per week to you exercise?  4    How many minutes per day do you exercise?  30    Total minutes per week of exercise  120      Patient Education   Previous Diabetes Education  No    Disease state   Definition of diabetes, type 1 and 2, and the diagnosis of diabetes    Nutrition management   Role of diet in the treatment of diabetes and the relationship between the three main macronutrients and blood glucose level;Reviewed blood glucose goals for pre and post meals and how to evaluate the patients' food intake  on their blood glucose level.;Food label reading, portion sizes and measuring food.    Physical activity and exercise   Role of exercise on diabetes management, blood pressure control and cardiac health.    Medications  Reviewed patients medication for diabetes, action, purpose, timing of dose and side effects.    Monitoring  Purpose and frequency of SMBG.;Taught/discussed recording of test results and interpretation of SMBG.;Identified appropriate SMBG and/or A1C goals.    Chronic complications  Relationship between chronic complications and blood glucose control    Psychosocial adjustment  Identified and addressed patients feelings and concerns about diabetes      Individualized Goals (developed by patient)   Reducing Risk  Improve blood sugars Decrease medications Prevent diabetes complications Lose weight Lead a healthier lifestyle Become more fit     Outcomes   Future DMSE  3 weeks       Individualized Plan for Diabetes Self-Management Training:   Learning Objective:  Patient will have a greater understanding of diabetes self-management. Patient education plan is to attend individual and/or group sessions per assessed needs and concerns.   Plan:   Patient Instructions  Check blood sugars 1 x day before breakfast or 2 hrs after one meal every day Bring blood sugar records to the next class Exercise: Continue walking  for  30  minutes   4 days a week and gradually increase to 30 minutes 5 x week Eat 3 meals day,  1-2 snacks a day Space meals 4-6 hours apart Allow 2-3 hours between meals and snacks Continue to limit desserts and sweets  Expected Outcomes:   Demonstrated interest in learning. Expect positive outcomes  Education material provided: General Meal Planning Guidelines Simple Meal Plan  If problems or questions, patient to contact team via:  Johny Drilling, [place], [place], CDE [phone]  Future DSME appointment:  3 weeks July 26, 2018 for Diabetes Class 1

## 2018-07-03 NOTE — Patient Instructions (Signed)
Check blood sugars 1 x day before breakfast or 2 hrs after one meal every day Bring blood sugar records to the next class  Exercise: Continue walking  for  30  minutes   4 days a week and gradually increase to 30 minutes 5 x week  Eat 3 meals day,  1-2 snacks a day Space meals 4-6 hours apart Allow 2-3 hours between meals and snacks Continue to limit desserts and sweets  Return for classes on:

## 2018-07-17 DIAGNOSIS — R911 Solitary pulmonary nodule: Secondary | ICD-10-CM | POA: Diagnosis not present

## 2018-07-17 DIAGNOSIS — J449 Chronic obstructive pulmonary disease, unspecified: Secondary | ICD-10-CM | POA: Diagnosis not present

## 2018-07-17 DIAGNOSIS — G4733 Obstructive sleep apnea (adult) (pediatric): Secondary | ICD-10-CM | POA: Diagnosis not present

## 2018-07-26 ENCOUNTER — Encounter: Payer: Self-pay | Admitting: Dietician

## 2018-07-26 ENCOUNTER — Encounter: Payer: PPO | Attending: Family Medicine | Admitting: Dietician

## 2018-07-26 VITALS — Wt 191.7 lb

## 2018-07-26 DIAGNOSIS — E119 Type 2 diabetes mellitus without complications: Secondary | ICD-10-CM | POA: Diagnosis not present

## 2018-07-26 DIAGNOSIS — Z6827 Body mass index (BMI) 27.0-27.9, adult: Secondary | ICD-10-CM | POA: Insufficient documentation

## 2018-07-26 DIAGNOSIS — Z713 Dietary counseling and surveillance: Secondary | ICD-10-CM | POA: Insufficient documentation

## 2018-07-26 NOTE — Progress Notes (Signed)

## 2018-08-02 ENCOUNTER — Encounter: Payer: PPO | Admitting: *Deleted

## 2018-08-02 VITALS — Wt 190.4 lb

## 2018-08-02 DIAGNOSIS — Z713 Dietary counseling and surveillance: Secondary | ICD-10-CM | POA: Diagnosis not present

## 2018-08-02 DIAGNOSIS — E119 Type 2 diabetes mellitus without complications: Secondary | ICD-10-CM

## 2018-08-02 NOTE — Progress Notes (Signed)

## 2018-08-09 ENCOUNTER — Encounter: Payer: PPO | Admitting: Dietician

## 2018-08-09 ENCOUNTER — Encounter: Payer: Self-pay | Admitting: Dietician

## 2018-08-09 VITALS — BP 150/90 | Wt 191.5 lb

## 2018-08-09 DIAGNOSIS — Z713 Dietary counseling and surveillance: Secondary | ICD-10-CM | POA: Diagnosis not present

## 2018-08-09 DIAGNOSIS — E119 Type 2 diabetes mellitus without complications: Secondary | ICD-10-CM

## 2018-08-09 NOTE — Progress Notes (Signed)
Appt. Start Time: 900 Appt. End Time: 1200  Class 3 Diabetes Overview - identify functions of pancreas and insulin; define insulin deficiency vs insulin resistance  Medications - state name, dose, timing of currently prescribed medications; describe types of medications available for diabetes  Psychosocial - identify DM as a source of stress; state the effects of stress on BG control; verbalize appropriate stress management techniques; identify personal stress issues   Nutritional Management - use food labels to identify serving size, content of carbohydrate, fiber, protein, fat, saturated fat and sodium; recognize food sources of fat, saturated fat, trans fat, and sodium, and verbalize goals for intake; describe healthful, appropriate food choices when dining out   Exercise - state a plan for personal exercise; verbalize contraindications for exercise  Self-Monitoring - state importance of SMBG; use SMBG results to effectively manage diabetes; identify importance of regular HbA1C testing and goals for results  Acute Complications - recognize hyperglycemia and hypoglycemia with causes and effects; identify blood glucose results as high, low or in control; list steps in treating and preventing high and low blood glucose  Chronic Complications - state importance of daily self-foot exams; explain appropriate eye and dental care  Lifestyle Changes/Goals & Health/Community Resources - set goals for proper diabetes care; state need for and frequency of healthcare follow-up; describe appropriate community resources for good health (ADA, web sites, apps)   Teaching Materials Used: Class 3 Slide Packet Diabetes Stress Test Stress Management Tools Stress Poem Goal Setting Worksheet Website/App List    

## 2018-08-13 ENCOUNTER — Encounter: Payer: Self-pay | Admitting: *Deleted

## 2018-08-21 DIAGNOSIS — E119 Type 2 diabetes mellitus without complications: Secondary | ICD-10-CM | POA: Diagnosis not present

## 2018-08-21 DIAGNOSIS — B351 Tinea unguium: Secondary | ICD-10-CM | POA: Diagnosis not present

## 2018-08-21 DIAGNOSIS — M79672 Pain in left foot: Secondary | ICD-10-CM | POA: Diagnosis not present

## 2018-08-21 DIAGNOSIS — M79671 Pain in right foot: Secondary | ICD-10-CM | POA: Diagnosis not present

## 2018-08-27 DIAGNOSIS — G4733 Obstructive sleep apnea (adult) (pediatric): Secondary | ICD-10-CM | POA: Diagnosis not present

## 2018-09-27 DIAGNOSIS — Z23 Encounter for immunization: Secondary | ICD-10-CM | POA: Diagnosis not present

## 2018-10-09 DIAGNOSIS — H5213 Myopia, bilateral: Secondary | ICD-10-CM | POA: Diagnosis not present

## 2018-10-09 DIAGNOSIS — H2513 Age-related nuclear cataract, bilateral: Secondary | ICD-10-CM | POA: Diagnosis not present

## 2018-10-09 DIAGNOSIS — Z7984 Long term (current) use of oral hypoglycemic drugs: Secondary | ICD-10-CM | POA: Diagnosis not present

## 2018-10-09 DIAGNOSIS — E119 Type 2 diabetes mellitus without complications: Secondary | ICD-10-CM | POA: Diagnosis not present

## 2018-10-09 DIAGNOSIS — H524 Presbyopia: Secondary | ICD-10-CM | POA: Diagnosis not present

## 2018-10-09 DIAGNOSIS — H52223 Regular astigmatism, bilateral: Secondary | ICD-10-CM | POA: Diagnosis not present

## 2018-10-18 DIAGNOSIS — Z79899 Other long term (current) drug therapy: Secondary | ICD-10-CM | POA: Diagnosis not present

## 2018-10-18 DIAGNOSIS — E119 Type 2 diabetes mellitus without complications: Secondary | ICD-10-CM | POA: Diagnosis not present

## 2018-10-22 DIAGNOSIS — I10 Essential (primary) hypertension: Secondary | ICD-10-CM | POA: Diagnosis not present

## 2018-10-22 DIAGNOSIS — Z79899 Other long term (current) drug therapy: Secondary | ICD-10-CM | POA: Diagnosis not present

## 2018-10-22 DIAGNOSIS — Z9989 Dependence on other enabling machines and devices: Secondary | ICD-10-CM | POA: Diagnosis not present

## 2018-10-22 DIAGNOSIS — E7800 Pure hypercholesterolemia, unspecified: Secondary | ICD-10-CM | POA: Diagnosis not present

## 2018-10-22 DIAGNOSIS — Z125 Encounter for screening for malignant neoplasm of prostate: Secondary | ICD-10-CM | POA: Diagnosis not present

## 2018-10-22 DIAGNOSIS — E119 Type 2 diabetes mellitus without complications: Secondary | ICD-10-CM | POA: Diagnosis not present

## 2018-10-22 DIAGNOSIS — G4733 Obstructive sleep apnea (adult) (pediatric): Secondary | ICD-10-CM | POA: Diagnosis not present

## 2018-10-22 DIAGNOSIS — I2510 Atherosclerotic heart disease of native coronary artery without angina pectoris: Secondary | ICD-10-CM | POA: Diagnosis not present

## 2018-11-20 DIAGNOSIS — B351 Tinea unguium: Secondary | ICD-10-CM | POA: Diagnosis not present

## 2018-11-20 DIAGNOSIS — D2370 Other benign neoplasm of skin of unspecified lower limb, including hip: Secondary | ICD-10-CM | POA: Diagnosis not present

## 2018-11-20 DIAGNOSIS — E119 Type 2 diabetes mellitus without complications: Secondary | ICD-10-CM | POA: Diagnosis not present

## 2018-12-26 DIAGNOSIS — Z9889 Other specified postprocedural states: Secondary | ICD-10-CM | POA: Diagnosis not present

## 2018-12-26 DIAGNOSIS — I10 Essential (primary) hypertension: Secondary | ICD-10-CM | POA: Diagnosis not present

## 2018-12-26 DIAGNOSIS — J449 Chronic obstructive pulmonary disease, unspecified: Secondary | ICD-10-CM | POA: Diagnosis not present

## 2018-12-26 DIAGNOSIS — K220 Achalasia of cardia: Secondary | ICD-10-CM | POA: Diagnosis not present

## 2018-12-26 DIAGNOSIS — R079 Chest pain, unspecified: Secondary | ICD-10-CM | POA: Diagnosis not present

## 2018-12-26 DIAGNOSIS — K219 Gastro-esophageal reflux disease without esophagitis: Secondary | ICD-10-CM | POA: Diagnosis not present

## 2018-12-26 DIAGNOSIS — I480 Paroxysmal atrial fibrillation: Secondary | ICD-10-CM | POA: Diagnosis not present

## 2018-12-26 DIAGNOSIS — G4733 Obstructive sleep apnea (adult) (pediatric): Secondary | ICD-10-CM | POA: Diagnosis not present

## 2018-12-26 DIAGNOSIS — R0602 Shortness of breath: Secondary | ICD-10-CM | POA: Diagnosis not present

## 2018-12-26 DIAGNOSIS — I208 Other forms of angina pectoris: Secondary | ICD-10-CM | POA: Diagnosis not present

## 2018-12-26 DIAGNOSIS — R011 Cardiac murmur, unspecified: Secondary | ICD-10-CM | POA: Diagnosis not present

## 2018-12-26 DIAGNOSIS — E119 Type 2 diabetes mellitus without complications: Secondary | ICD-10-CM | POA: Diagnosis not present

## 2019-01-17 ENCOUNTER — Other Ambulatory Visit: Payer: Self-pay | Admitting: Specialist

## 2019-01-17 DIAGNOSIS — G4733 Obstructive sleep apnea (adult) (pediatric): Secondary | ICD-10-CM | POA: Diagnosis not present

## 2019-01-17 DIAGNOSIS — R918 Other nonspecific abnormal finding of lung field: Secondary | ICD-10-CM | POA: Diagnosis not present

## 2019-01-17 DIAGNOSIS — R0609 Other forms of dyspnea: Secondary | ICD-10-CM | POA: Diagnosis not present

## 2019-01-17 DIAGNOSIS — J449 Chronic obstructive pulmonary disease, unspecified: Secondary | ICD-10-CM | POA: Diagnosis not present

## 2019-02-01 ENCOUNTER — Ambulatory Visit
Admission: RE | Admit: 2019-02-01 | Discharge: 2019-02-01 | Disposition: A | Discharge status: Home / Self Care | Payer: PPO | Source: Ambulatory Visit | Attending: Specialist | Admitting: Specialist

## 2019-02-01 DIAGNOSIS — R918 Other nonspecific abnormal finding of lung field: Secondary | ICD-10-CM | POA: Diagnosis not present

## 2019-02-01 DIAGNOSIS — J432 Centrilobular emphysema: Secondary | ICD-10-CM | POA: Diagnosis not present

## 2019-02-04 DIAGNOSIS — R911 Solitary pulmonary nodule: Secondary | ICD-10-CM | POA: Diagnosis not present

## 2019-02-04 DIAGNOSIS — J449 Chronic obstructive pulmonary disease, unspecified: Secondary | ICD-10-CM | POA: Diagnosis not present

## 2019-02-26 DIAGNOSIS — E7800 Pure hypercholesterolemia, unspecified: Secondary | ICD-10-CM | POA: Diagnosis not present

## 2019-02-26 DIAGNOSIS — M79672 Pain in left foot: Secondary | ICD-10-CM | POA: Diagnosis not present

## 2019-02-26 DIAGNOSIS — Z79899 Other long term (current) drug therapy: Secondary | ICD-10-CM | POA: Diagnosis not present

## 2019-02-26 DIAGNOSIS — Z125 Encounter for screening for malignant neoplasm of prostate: Secondary | ICD-10-CM | POA: Diagnosis not present

## 2019-02-26 DIAGNOSIS — M65872 Other synovitis and tenosynovitis, left ankle and foot: Secondary | ICD-10-CM | POA: Diagnosis not present

## 2019-02-26 DIAGNOSIS — B351 Tinea unguium: Secondary | ICD-10-CM | POA: Diagnosis not present

## 2019-02-26 DIAGNOSIS — M79671 Pain in right foot: Secondary | ICD-10-CM | POA: Diagnosis not present

## 2019-02-26 DIAGNOSIS — E119 Type 2 diabetes mellitus without complications: Secondary | ICD-10-CM | POA: Diagnosis not present

## 2019-03-01 ENCOUNTER — Encounter: Payer: Self-pay | Admitting: *Deleted

## 2019-03-05 DIAGNOSIS — Z0000 Encounter for general adult medical examination without abnormal findings: Secondary | ICD-10-CM | POA: Diagnosis not present

## 2019-03-05 DIAGNOSIS — I700 Atherosclerosis of aorta: Secondary | ICD-10-CM | POA: Insufficient documentation

## 2019-03-25 ENCOUNTER — Other Ambulatory Visit (INDEPENDENT_AMBULATORY_CARE_PROVIDER_SITE_OTHER): Payer: PPO

## 2019-03-25 ENCOUNTER — Ambulatory Visit (INDEPENDENT_AMBULATORY_CARE_PROVIDER_SITE_OTHER): Payer: PPO | Admitting: Vascular Surgery

## 2019-03-28 DIAGNOSIS — R319 Hematuria, unspecified: Secondary | ICD-10-CM | POA: Diagnosis not present

## 2019-04-23 DIAGNOSIS — H5213 Myopia, bilateral: Secondary | ICD-10-CM | POA: Diagnosis not present

## 2019-04-23 DIAGNOSIS — E119 Type 2 diabetes mellitus without complications: Secondary | ICD-10-CM | POA: Diagnosis not present

## 2019-04-23 DIAGNOSIS — Z7984 Long term (current) use of oral hypoglycemic drugs: Secondary | ICD-10-CM | POA: Diagnosis not present

## 2019-04-23 DIAGNOSIS — H2513 Age-related nuclear cataract, bilateral: Secondary | ICD-10-CM | POA: Diagnosis not present

## 2019-04-23 DIAGNOSIS — H52223 Regular astigmatism, bilateral: Secondary | ICD-10-CM | POA: Diagnosis not present

## 2019-04-23 DIAGNOSIS — H524 Presbyopia: Secondary | ICD-10-CM | POA: Diagnosis not present

## 2019-06-04 DIAGNOSIS — E119 Type 2 diabetes mellitus without complications: Secondary | ICD-10-CM | POA: Diagnosis not present

## 2019-06-04 DIAGNOSIS — M79671 Pain in right foot: Secondary | ICD-10-CM | POA: Diagnosis not present

## 2019-06-04 DIAGNOSIS — M79672 Pain in left foot: Secondary | ICD-10-CM | POA: Diagnosis not present

## 2019-06-04 DIAGNOSIS — B351 Tinea unguium: Secondary | ICD-10-CM | POA: Diagnosis not present

## 2019-06-19 ENCOUNTER — Other Ambulatory Visit (INDEPENDENT_AMBULATORY_CARE_PROVIDER_SITE_OTHER): Payer: Self-pay | Admitting: Vascular Surgery

## 2019-06-19 DIAGNOSIS — I714 Abdominal aortic aneurysm, without rupture, unspecified: Secondary | ICD-10-CM

## 2019-06-24 ENCOUNTER — Other Ambulatory Visit: Payer: Self-pay

## 2019-06-24 ENCOUNTER — Ambulatory Visit (INDEPENDENT_AMBULATORY_CARE_PROVIDER_SITE_OTHER): Payer: PPO

## 2019-06-24 ENCOUNTER — Encounter (INDEPENDENT_AMBULATORY_CARE_PROVIDER_SITE_OTHER): Payer: Self-pay | Admitting: Vascular Surgery

## 2019-06-24 ENCOUNTER — Ambulatory Visit (INDEPENDENT_AMBULATORY_CARE_PROVIDER_SITE_OTHER): Payer: PPO | Admitting: Vascular Surgery

## 2019-06-24 VITALS — BP 125/78 | HR 62 | Resp 12 | Ht 70.0 in | Wt 190.0 lb

## 2019-06-24 DIAGNOSIS — Z7984 Long term (current) use of oral hypoglycemic drugs: Secondary | ICD-10-CM | POA: Diagnosis not present

## 2019-06-24 DIAGNOSIS — E119 Type 2 diabetes mellitus without complications: Secondary | ICD-10-CM

## 2019-06-24 DIAGNOSIS — E7800 Pure hypercholesterolemia, unspecified: Secondary | ICD-10-CM

## 2019-06-24 DIAGNOSIS — Z79899 Other long term (current) drug therapy: Secondary | ICD-10-CM

## 2019-06-24 DIAGNOSIS — Z87891 Personal history of nicotine dependence: Secondary | ICD-10-CM

## 2019-06-24 DIAGNOSIS — I10 Essential (primary) hypertension: Secondary | ICD-10-CM | POA: Diagnosis not present

## 2019-06-24 DIAGNOSIS — I714 Abdominal aortic aneurysm, without rupture, unspecified: Secondary | ICD-10-CM

## 2019-06-24 NOTE — Progress Notes (Signed)
MRN : [id number]  Bryan Strong is a 73 y.o. (01/24/1946) male who presents with chief complaint of  Chief Complaint  Patient presents with  . Follow-up  .  History of Present Illness:  The patient returns to the office for surveillance of a known abdominal aortic aneurysm. Patient denies abdominal pain or back pain, no other abdominal complaints. No changes suggesting embolic episodes.   There have been no interval changes in the patient's overall health care since his last visit.  Patient denies amaurosis fugax or TIA symptoms. There is no history of claudication or rest pain symptoms of the lower extremities. The patient denies angina or shortness of breath.   Duplex US of the aorta and iliac arteries today shows an AAA measured 3.35 cm with 1.47 cm right iliac artery aneurysm.  Duplex US of the aorta and iliac arteries from 03/20/2018 showed an AAA measured 3.20 cm with 1.47 cm right iliac artery aneurysm.  Current Meds  Medication Sig  . ascorbic acid (VITAMIN C) 500 MG tablet Take 500 mg by mouth daily.  Marland Kitchen aspirin 325 MG tablet Take 325 mg by mouth daily.  Marland Kitchen azelastine (ASTELIN) 0.1 % nasal spray Place 1 spray into both nostrils 2 (two) times daily. Use in each nostril as directed   . clopidogrel (PLAVIX) 75 MG tablet Take 75 mg by mouth daily.  . fexofenadine (ALLEGRA) 180 MG tablet Take 180 mg by mouth daily.  . Flaxseed, Linseed, (FLAXSEED OIL PO) Take 1 tablet by mouth 2 (two) times daily.   . fluticasone (FLONASE) 50 MCG/ACT nasal spray Place 1 spray into both nostrils 2 (two) times daily.   Marland Kitchen glipiZIDE (GLUCOTROL XL) 10 MG 24 hr tablet Take 10 mg by mouth daily.  Marland Kitchen lisinopril (PRINIVIL,ZESTRIL) 10 MG tablet Take 10 mg by mouth 2 (two) times daily.  . metFORMIN (GLUCOPHAGE) 1000 MG tablet Take 1,000 mg by mouth 2 (two) times daily with a meal.  . metoprolol tartrate (LOPRESSOR) 25 MG tablet Take 12.5 mg by mouth 2 (two) times daily.  . Multiple Vitamin (MULTIVITAMIN  WITH MINERALS) TABS tablet Take 1 tablet by mouth daily.  Marland Kitchen olopatadine (PATANOL) 0.1 % ophthalmic solution Place 1 drop into both eyes 2 (two) times daily.  . pantoprazole (PROTONIX) 40 MG tablet Take 40 mg by mouth daily before breakfast.   . Polyethyl Glycol-Propyl Glycol (SYSTANE ULTRA OP) Apply 2 drops to eye 5 (five) times daily as needed (dry eyes).  . simvastatin (ZOCOR) 40 MG tablet Take 40 mg by mouth every evening.     Past Medical History:  Diagnosis Date  . A-fib ([place])   . Achalasia of esophagus   . Allergic rhinitis   . Anemia   . Anginal pain ([place])   . Aortic aneurysm ([place])    followed by Dr. Franchot Gallo  . Coronary artery disease   . Diabetes mellitus without complication ([place])   . Dyspnea   . Emphysema of lung ([place])   . GERD (gastroesophageal reflux disease)   . Heart murmur   . Hypercholesteremia   . Hypertension   . Kidney stones   . Skin cancer   . Sleep apnea    CPAP  . Wears dentures    full upper and lower    Past Surgical History:  Procedure Laterality Date  . CHOLECYSTECTOMY    . colon polyps    . COLONOSCOPY WITH PROPOFOL N/A 09/18/2017   Procedure: COLONOSCOPY WITH PROPOFOL;  Surgeon: Manya Silvas, MD;  Location: [facility] ENDOSCOPY;  Service: Endoscopy;  Laterality: N/A;  . CORONARY ANGIOPLASTY WITH STENT PLACEMENT  01/03/11   [facility]  Dr. Saralyn Pilar  . ECTROPION REPAIR Bilateral 05/03/2016   Procedure: REPAIR OF ECTROPION BILATERAL LOWER EYELIDS;  Surgeon: Karle Starch, MD;  Location: [place];  Service: Ophthalmology;  Laterality: Bilateral;  . ESOPHAGOGASTRODUODENOSCOPY (EGD) WITH PROPOFOL N/A 07/21/2017   Procedure: ESOPHAGOGASTRODUODENOSCOPY (EGD) WITH PROPOFOL;  Surgeon: Manya Silvas, MD;  Location: [facility] ENDOSCOPY;  Service: Endoscopy;  Laterality: N/A;  . ESOPHAGOGASTRODUODENOSCOPY (EGD) WITH PROPOFOL Bilateral 10/18/2017   Procedure: ESOPHAGOGASTRODUODENOSCOPY (EGD) WITH PROPOFOL;  Surgeon: Manya Silvas, MD;  Location: [facility]  ENDOSCOPY;  Service: Endoscopy;  Laterality: Bilateral;  . HERNIA REPAIR    . LEFT HEART CATH Left 06/08/2017   Procedure: Left Heart Cath and Coronary Angiography;  Surgeon: Yolonda Kida, MD;  Location: [place] CV LAB;  Service: Cardiovascular;  Laterality: Left;  . LID LESION EXCISION Bilateral 05/03/2016   Procedure: ,EXCISION CYST RIGHT UPPER EYELID;  Surgeon: Karle Starch, MD;  Location: [place];  Service: Ophthalmology;  Laterality: Bilateral;  Diabetic - oral meds CPAP  . NASAL SEPTUM SURGERY  1967  . SAVORY DILATION N/A 07/21/2017   Procedure: SAVORY DILATION;  Surgeon: Manya Silvas, MD;  Location: [facility] ENDOSCOPY;  Service: Endoscopy;  Laterality: N/A;    Social History Social History   Tobacco Use  . Smoking status: Former Smoker    Packs/day: 1.00    Years: 56.00    Pack years: 56.00    Types: Cigarettes    Quit date: 11/04/2015    Years since quitting: 3.6  . Smokeless tobacco: Never Used  Substance Use Topics  . Alcohol use: No  . Drug use: No    Family History Family History  Problem Relation Age of Onset  . Diabetes Brother   . Diabetes Maternal Aunt   . Diabetes Maternal Uncle   . Diabetes Maternal Grandmother     Allergies  Allergen Reactions  . Other Rash    ERYTHROMYCIN OINTMENT "some nerve pill, caused HTN"     REVIEW OF SYSTEMS (Negative unless checked)  Constitutional: [] Weight loss  [] Fever  [] Chills Cardiac: [] Chest pain   [] Chest pressure   [] Palpitations   [] Shortness of breath when laying flat   [] Shortness of breath with exertion. Vascular:  [] Pain in legs with walking   [] Pain in legs at rest  [] History of DVT   [] Phlebitis   [] Swelling in legs   [] Varicose veins   [] Non-healing ulcers Pulmonary:   [] Uses home oxygen   [] Productive cough   [] Hemoptysis   [] Wheeze  [] COPD   [] Asthma Neurologic:  [] Dizziness   [] Seizures   [] History of stroke   [] History of TIA  [] Aphasia   [] Vissual changes   [] Weakness or  numbness in arm   [] Weakness or numbness in leg Musculoskeletal:   [] Joint swelling   [] Joint pain   [] Low back pain Hematologic:  [] Easy bruising  [] Easy bleeding   [] Hypercoagulable state   [] Anemic Gastrointestinal:  [] Diarrhea   [] Vomiting  [] Gastroesophageal reflux/heartburn   [] Difficulty swallowing. Genitourinary:  [] Chronic kidney disease   [] Difficult urination  [] Frequent urination   [] Blood in urine Skin:  [] Rashes   [] Ulcers  Psychological:  [] History of anxiety   []  History of major depression.  Physical Examination  Vitals:   06/24/19 0811  BP: 125/78  Pulse: 62  Resp: 12  Weight: 190 lb (86.2 kg)  Height: 5\' 10"  (1.778  m)   Body mass index is 27.26 kg/m. Gen: WD/WN, NAD Head: [place]/AT, No temporalis wasting.  Ear/Nose/Throat: Hearing grossly intact, nares w/o erythema or drainage Eyes: PER, EOMI, sclera nonicteric.  Neck: Supple, no large masses.   Pulmonary:  Good air movement, no audible wheezing bilaterally, no use of accessory muscles.  Cardiac: RRR, no JVD Vascular:  Vessel Right Left  Radial Palpable Palpable  Popliteal Palpable Palpable  PT Palpable Palpable  DP Palpable Palpable  Gastrointestinal: Non-distended. No guarding/no peritoneal signs.  Musculoskeletal: M/S 5/5 throughout.  No deformity or atrophy.  Neurologic: CN 2-12 intact. Symmetrical.  Speech is fluent. Motor exam as listed above. Psychiatric: Judgment intact, Mood & affect appropriate for pt's clinical situation. Dermatologic: No rashes or ulcers noted.  No changes consistent with cellulitis. Lymph : No lichenification or skin changes of chronic lymphedema.  CBC Lab Results  Component Value Date   WBC 5.2 05/14/2018   HGB 14.8 05/14/2018   HCT 42.5 05/14/2018   MCV 91.7 05/14/2018   PLT 125 (L) 05/14/2018    BMET    Component Value Date/Time   NA 132 (L) 05/14/2018 1137   NA 140 01/11/2013 2241   K 4.4 05/14/2018 1137   K 3.7 01/11/2013 2241   CL 96 (L) 05/14/2018 1137   CL  105 01/11/2013 2241   CO2 26 05/14/2018 1137   CO2 29 01/11/2013 2241   GLUCOSE 451 (H) 05/14/2018 1137   GLUCOSE 219 (H) 01/11/2013 2241   BUN 15 05/14/2018 1137   BUN 14 01/11/2013 2241   CREATININE 1.02 05/14/2018 1137   CREATININE 1.06 01/11/2013 2241   CALCIUM 9.5 05/14/2018 1137   CALCIUM 9.4 01/11/2013 2241   GFRNONAA >60 05/14/2018 1137   GFRNONAA >60 01/11/2013 2241   GFRAA >60 05/14/2018 1137   GFRAA >60 01/11/2013 2241   CrCl cannot be calculated (Patient's most recent lab result is older than the maximum 21 days allowed.).  COAG No results found for: INR, PROTIME  Radiology No results found.    Assessment/Plan 1. AAA (abdominal aortic aneurysm) without rupture (HCC) No surgery or intervention at this time.  The patient has an asymptomatic abdominal aortic aneurysm that is less than 4 cm in maximal diameter.  I have discussed the natural history of abdominal aortic aneurysm and the small risk of rupture for aneurysm less than 5 cm in size.  However, as these small aneurysms tend to enlarge over time, continued surveillance with ultrasound or CT scan is mandatory.   I have also discussed optimizing medical management with hypertension and lipid control and the importance of abstinence from tobacco.  The patient is also encouraged to exercise a minimum of 30 minutes 4 times a week.   Should the patient develop new onset abdominal or back pain or signs of peripheral embolization they are instructed to seek medical attention immediately and to alert the physician providing care that they have an aneurysm.   The patient voices their understanding. The patient will return in 12 months with an aortic duplex. - VAS US AORTA/IVC/ILIACS; Future  2. Essential hypertension Continue antihypertensive medications as already ordered, these medications have been reviewed and there are no changes at this time.   3. Type 2 diabetes mellitus without complication, without  long-term current use of insulin (HCC) Continue hypoglycemic medications as already ordered, these medications have been reviewed and there are no changes at this time.  Hgb A1C to be monitored as already arranged by primary service   4.  Pure hypercholesterolemia Continue statin as ordered and reviewed, no changes at this time    Hortencia Pilar, MD  06/24/2019 8:37 AM

## 2019-06-27 DIAGNOSIS — R0602 Shortness of breath: Secondary | ICD-10-CM | POA: Diagnosis not present

## 2019-06-27 DIAGNOSIS — E119 Type 2 diabetes mellitus without complications: Secondary | ICD-10-CM | POA: Diagnosis not present

## 2019-06-27 DIAGNOSIS — Z9889 Other specified postprocedural states: Secondary | ICD-10-CM | POA: Diagnosis not present

## 2019-06-27 DIAGNOSIS — G4733 Obstructive sleep apnea (adult) (pediatric): Secondary | ICD-10-CM | POA: Diagnosis not present

## 2019-06-27 DIAGNOSIS — J449 Chronic obstructive pulmonary disease, unspecified: Secondary | ICD-10-CM | POA: Diagnosis not present

## 2019-06-27 DIAGNOSIS — K220 Achalasia of cardia: Secondary | ICD-10-CM | POA: Diagnosis not present

## 2019-06-27 DIAGNOSIS — R011 Cardiac murmur, unspecified: Secondary | ICD-10-CM | POA: Diagnosis not present

## 2019-06-27 DIAGNOSIS — I10 Essential (primary) hypertension: Secondary | ICD-10-CM | POA: Diagnosis not present

## 2019-06-27 DIAGNOSIS — I208 Other forms of angina pectoris: Secondary | ICD-10-CM | POA: Diagnosis not present

## 2019-06-27 DIAGNOSIS — K219 Gastro-esophageal reflux disease without esophagitis: Secondary | ICD-10-CM | POA: Diagnosis not present

## 2019-06-27 DIAGNOSIS — I480 Paroxysmal atrial fibrillation: Secondary | ICD-10-CM | POA: Diagnosis not present

## 2019-06-27 DIAGNOSIS — Z87891 Personal history of nicotine dependence: Secondary | ICD-10-CM | POA: Diagnosis not present

## 2019-07-09 DIAGNOSIS — E1169 Type 2 diabetes mellitus with other specified complication: Secondary | ICD-10-CM | POA: Diagnosis not present

## 2019-07-09 DIAGNOSIS — Z79899 Other long term (current) drug therapy: Secondary | ICD-10-CM | POA: Diagnosis not present

## 2019-07-09 DIAGNOSIS — E785 Hyperlipidemia, unspecified: Secondary | ICD-10-CM | POA: Diagnosis not present

## 2019-07-11 DIAGNOSIS — K219 Gastro-esophageal reflux disease without esophagitis: Secondary | ICD-10-CM | POA: Diagnosis not present

## 2019-07-11 DIAGNOSIS — J449 Chronic obstructive pulmonary disease, unspecified: Secondary | ICD-10-CM | POA: Diagnosis not present

## 2019-07-11 DIAGNOSIS — Z79899 Other long term (current) drug therapy: Secondary | ICD-10-CM | POA: Diagnosis not present

## 2019-07-11 DIAGNOSIS — E119 Type 2 diabetes mellitus without complications: Secondary | ICD-10-CM | POA: Diagnosis not present

## 2019-07-11 DIAGNOSIS — I700 Atherosclerosis of aorta: Secondary | ICD-10-CM | POA: Diagnosis not present

## 2019-07-11 DIAGNOSIS — E7800 Pure hypercholesterolemia, unspecified: Secondary | ICD-10-CM | POA: Diagnosis not present

## 2019-07-11 DIAGNOSIS — I10 Essential (primary) hypertension: Secondary | ICD-10-CM | POA: Diagnosis not present

## 2019-08-11 DIAGNOSIS — R509 Fever, unspecified: Secondary | ICD-10-CM | POA: Diagnosis not present

## 2019-08-11 DIAGNOSIS — Z20828 Contact with and (suspected) exposure to other viral communicable diseases: Secondary | ICD-10-CM | POA: Diagnosis not present

## 2019-08-11 DIAGNOSIS — R350 Frequency of micturition: Secondary | ICD-10-CM | POA: Diagnosis not present

## 2019-08-19 DIAGNOSIS — G4733 Obstructive sleep apnea (adult) (pediatric): Secondary | ICD-10-CM | POA: Diagnosis not present

## 2019-08-19 DIAGNOSIS — J449 Chronic obstructive pulmonary disease, unspecified: Secondary | ICD-10-CM | POA: Diagnosis not present

## 2019-09-05 DIAGNOSIS — B351 Tinea unguium: Secondary | ICD-10-CM | POA: Diagnosis not present

## 2019-09-05 DIAGNOSIS — E119 Type 2 diabetes mellitus without complications: Secondary | ICD-10-CM | POA: Diagnosis not present

## 2019-09-05 DIAGNOSIS — M79672 Pain in left foot: Secondary | ICD-10-CM | POA: Diagnosis not present

## 2019-09-05 DIAGNOSIS — D2370 Other benign neoplasm of skin of unspecified lower limb, including hip: Secondary | ICD-10-CM | POA: Diagnosis not present

## 2019-09-05 DIAGNOSIS — M79671 Pain in right foot: Secondary | ICD-10-CM | POA: Diagnosis not present

## 2019-10-23 DIAGNOSIS — H2513 Age-related nuclear cataract, bilateral: Secondary | ICD-10-CM | POA: Diagnosis not present

## 2019-10-23 DIAGNOSIS — E113293 Type 2 diabetes mellitus with mild nonproliferative diabetic retinopathy without macular edema, bilateral: Secondary | ICD-10-CM | POA: Diagnosis not present

## 2019-10-23 DIAGNOSIS — H52223 Regular astigmatism, bilateral: Secondary | ICD-10-CM | POA: Diagnosis not present

## 2019-10-23 DIAGNOSIS — H524 Presbyopia: Secondary | ICD-10-CM | POA: Diagnosis not present

## 2019-10-23 DIAGNOSIS — H5213 Myopia, bilateral: Secondary | ICD-10-CM | POA: Diagnosis not present

## 2019-11-04 DIAGNOSIS — E7800 Pure hypercholesterolemia, unspecified: Secondary | ICD-10-CM | POA: Diagnosis not present

## 2019-11-04 DIAGNOSIS — E119 Type 2 diabetes mellitus without complications: Secondary | ICD-10-CM | POA: Diagnosis not present

## 2019-11-04 DIAGNOSIS — Z79899 Other long term (current) drug therapy: Secondary | ICD-10-CM | POA: Diagnosis not present

## 2019-11-11 DIAGNOSIS — Z125 Encounter for screening for malignant neoplasm of prostate: Secondary | ICD-10-CM | POA: Diagnosis not present

## 2019-11-11 DIAGNOSIS — E1169 Type 2 diabetes mellitus with other specified complication: Secondary | ICD-10-CM | POA: Diagnosis not present

## 2019-11-11 DIAGNOSIS — I10 Essential (primary) hypertension: Secondary | ICD-10-CM | POA: Diagnosis not present

## 2019-11-11 DIAGNOSIS — I2510 Atherosclerotic heart disease of native coronary artery without angina pectoris: Secondary | ICD-10-CM | POA: Diagnosis not present

## 2019-11-11 DIAGNOSIS — D649 Anemia, unspecified: Secondary | ICD-10-CM | POA: Diagnosis not present

## 2019-11-11 DIAGNOSIS — Z79899 Other long term (current) drug therapy: Secondary | ICD-10-CM | POA: Diagnosis not present

## 2019-11-11 DIAGNOSIS — E1159 Type 2 diabetes mellitus with other circulatory complications: Secondary | ICD-10-CM | POA: Diagnosis not present

## 2019-11-11 DIAGNOSIS — Z9989 Dependence on other enabling machines and devices: Secondary | ICD-10-CM | POA: Diagnosis not present

## 2019-11-11 DIAGNOSIS — E785 Hyperlipidemia, unspecified: Secondary | ICD-10-CM | POA: Diagnosis not present

## 2019-11-11 DIAGNOSIS — G4733 Obstructive sleep apnea (adult) (pediatric): Secondary | ICD-10-CM | POA: Diagnosis not present

## 2019-12-03 DIAGNOSIS — D2370 Other benign neoplasm of skin of unspecified lower limb, including hip: Secondary | ICD-10-CM | POA: Diagnosis not present

## 2019-12-03 DIAGNOSIS — E119 Type 2 diabetes mellitus without complications: Secondary | ICD-10-CM | POA: Diagnosis not present

## 2019-12-03 DIAGNOSIS — M79672 Pain in left foot: Secondary | ICD-10-CM | POA: Diagnosis not present

## 2019-12-03 DIAGNOSIS — M79671 Pain in right foot: Secondary | ICD-10-CM | POA: Diagnosis not present

## 2019-12-03 DIAGNOSIS — B351 Tinea unguium: Secondary | ICD-10-CM | POA: Diagnosis not present

## 2020-01-08 DIAGNOSIS — M25473 Effusion, unspecified ankle: Secondary | ICD-10-CM | POA: Diagnosis not present

## 2020-01-08 DIAGNOSIS — E119 Type 2 diabetes mellitus without complications: Secondary | ICD-10-CM | POA: Diagnosis not present

## 2020-01-08 DIAGNOSIS — I714 Abdominal aortic aneurysm, without rupture: Secondary | ICD-10-CM | POA: Diagnosis not present

## 2020-01-08 DIAGNOSIS — G4733 Obstructive sleep apnea (adult) (pediatric): Secondary | ICD-10-CM | POA: Diagnosis not present

## 2020-01-08 DIAGNOSIS — E7800 Pure hypercholesterolemia, unspecified: Secondary | ICD-10-CM | POA: Diagnosis not present

## 2020-01-08 DIAGNOSIS — I10 Essential (primary) hypertension: Secondary | ICD-10-CM | POA: Diagnosis not present

## 2020-01-08 DIAGNOSIS — K219 Gastro-esophageal reflux disease without esophagitis: Secondary | ICD-10-CM | POA: Diagnosis not present

## 2020-01-08 DIAGNOSIS — I480 Paroxysmal atrial fibrillation: Secondary | ICD-10-CM | POA: Diagnosis not present

## 2020-01-08 DIAGNOSIS — Z955 Presence of coronary angioplasty implant and graft: Secondary | ICD-10-CM | POA: Diagnosis not present

## 2020-01-08 DIAGNOSIS — I739 Peripheral vascular disease, unspecified: Secondary | ICD-10-CM | POA: Insufficient documentation

## 2020-01-08 DIAGNOSIS — I2510 Atherosclerotic heart disease of native coronary artery without angina pectoris: Secondary | ICD-10-CM | POA: Diagnosis not present

## 2020-01-08 DIAGNOSIS — J449 Chronic obstructive pulmonary disease, unspecified: Secondary | ICD-10-CM | POA: Diagnosis not present

## 2020-02-14 DIAGNOSIS — Z01818 Encounter for other preprocedural examination: Secondary | ICD-10-CM | POA: Diagnosis not present

## 2020-02-17 ENCOUNTER — Other Ambulatory Visit: Payer: Self-pay | Admitting: Specialist

## 2020-02-17 DIAGNOSIS — R911 Solitary pulmonary nodule: Secondary | ICD-10-CM | POA: Diagnosis not present

## 2020-02-17 DIAGNOSIS — Z01818 Encounter for other preprocedural examination: Secondary | ICD-10-CM | POA: Diagnosis not present

## 2020-02-17 DIAGNOSIS — J449 Chronic obstructive pulmonary disease, unspecified: Secondary | ICD-10-CM | POA: Diagnosis not present

## 2020-02-17 DIAGNOSIS — G4733 Obstructive sleep apnea (adult) (pediatric): Secondary | ICD-10-CM | POA: Diagnosis not present

## 2020-02-17 DIAGNOSIS — R0609 Other forms of dyspnea: Secondary | ICD-10-CM

## 2020-02-17 DIAGNOSIS — R0600 Dyspnea, unspecified: Secondary | ICD-10-CM | POA: Diagnosis not present

## 2020-03-03 DIAGNOSIS — B351 Tinea unguium: Secondary | ICD-10-CM | POA: Diagnosis not present

## 2020-03-03 DIAGNOSIS — D2370 Other benign neoplasm of skin of unspecified lower limb, including hip: Secondary | ICD-10-CM | POA: Diagnosis not present

## 2020-03-03 DIAGNOSIS — E119 Type 2 diabetes mellitus without complications: Secondary | ICD-10-CM | POA: Diagnosis not present

## 2020-03-16 IMAGING — CT CT MAXILLOFACIAL W/O CM
3 of 4 series · 16 of 47 positions shown, 19 images · non-contrast
Comparison: None.

CLINICAL DATA: Pt to ed via ems from home after having a syncopal
episode that was witnessed by wife. Pt fell and hit head laceration
noted to upper lip approx 1 inch through and through. Per pt, his
dentures broke when he fell. Pt is on a blood thinner. Pt reports he
was standing in kitchen with wife and felt dizzy. Reports "I felt a
funny feeling in my chest and then I passed out". Pt has hx of a
fib.

EXAM:
CT MAXILLOFACIAL WITHOUT CONTRAST
TECHNIQUE: Multidetector CT imaging of the maxillofacial structures was
performed. Multiplanar CT image reconstructions were also generated.

[Series 3: max soft · axial · 0.36mm/px · z∈[+260,+434]mm · 12 of 103 slices shown, 15 images]
[im 8/103  brain]
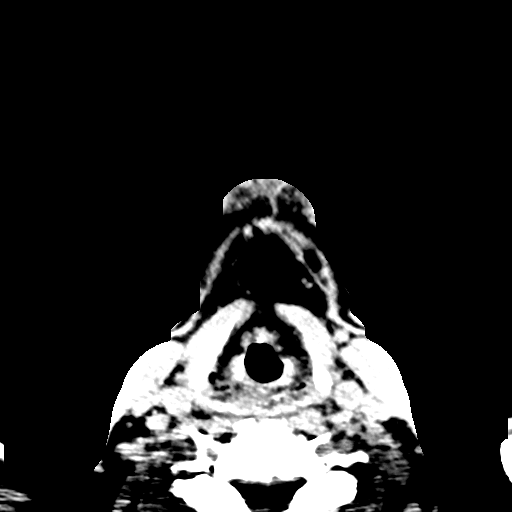
[im 8/103  bone]
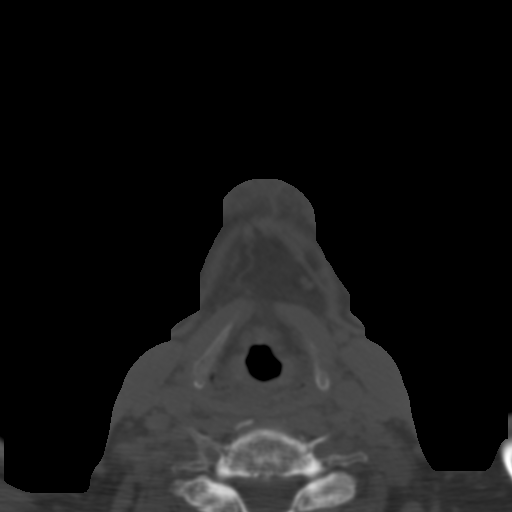
[im 15/103  bone]
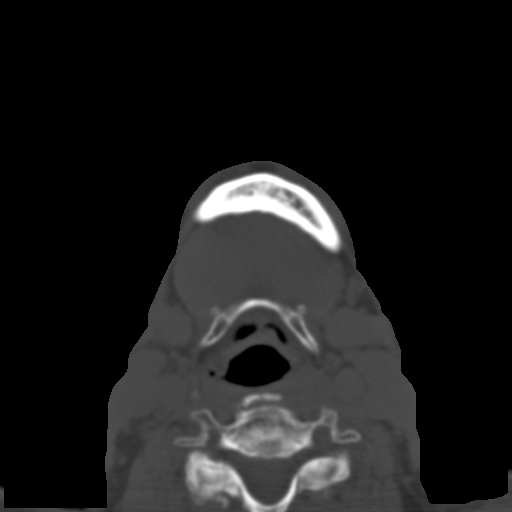
[im 22/103  bone]
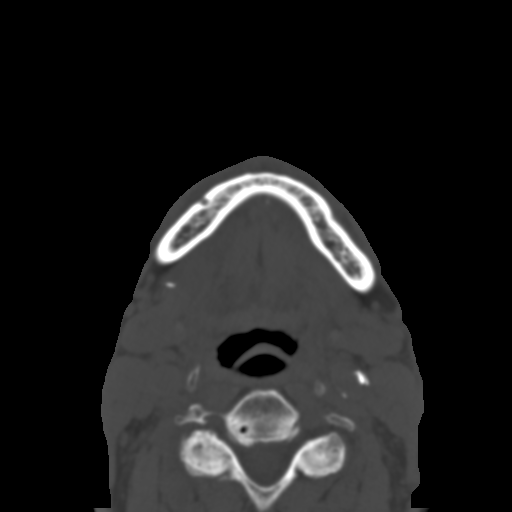
[im 32/103  bone]
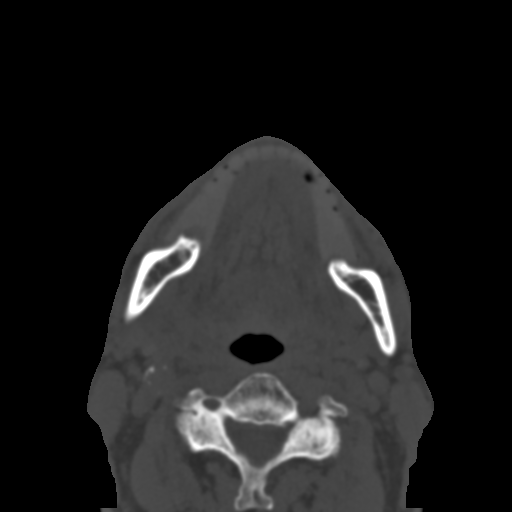
[im 39/103  brain]
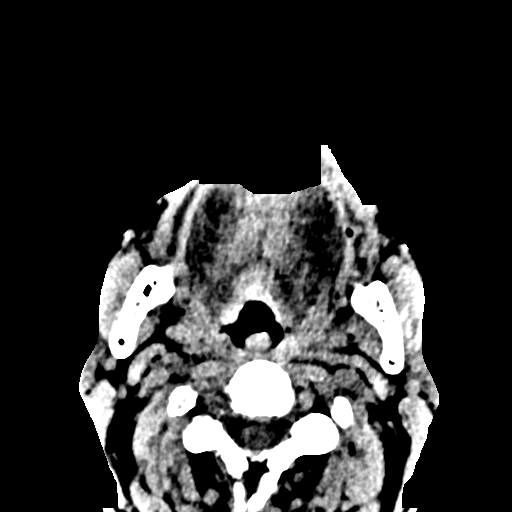
[im 39/103  bone]
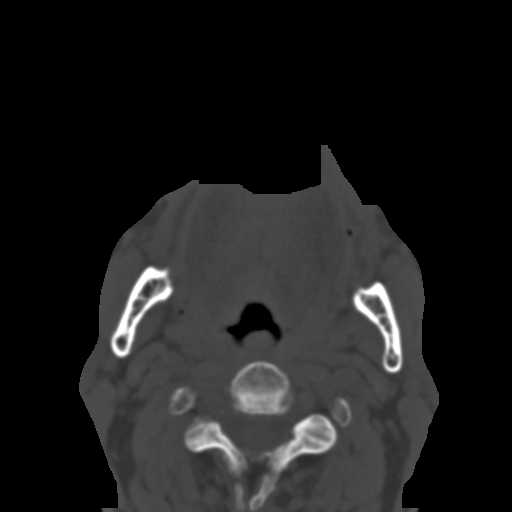
[im 46/103  bone]
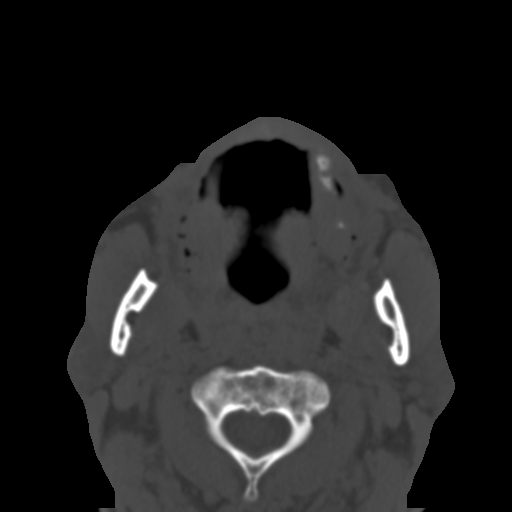
[im 57/103  bone]
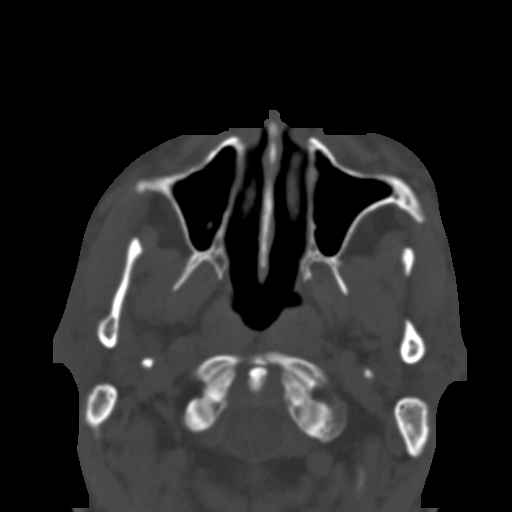
[im 64/103  bone]
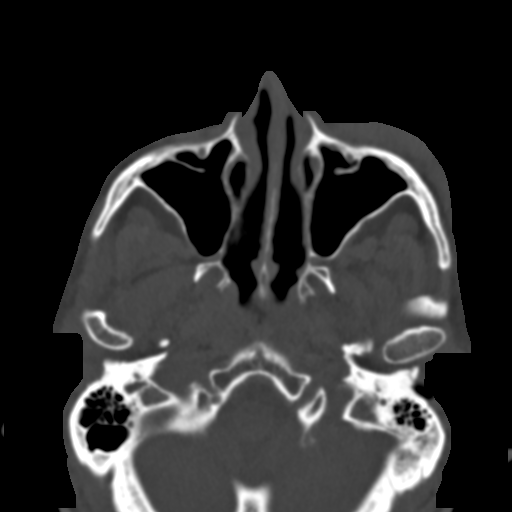
[im 71/103  brain]
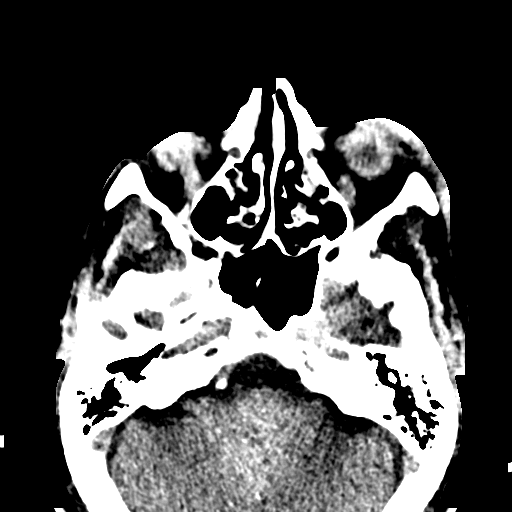
[im 71/103  bone]
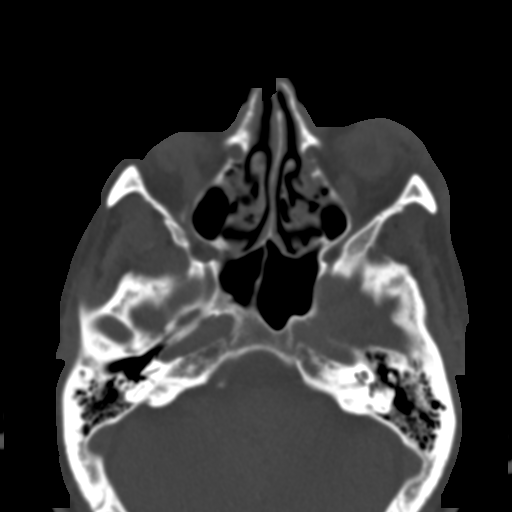
[im 81/103  bone]
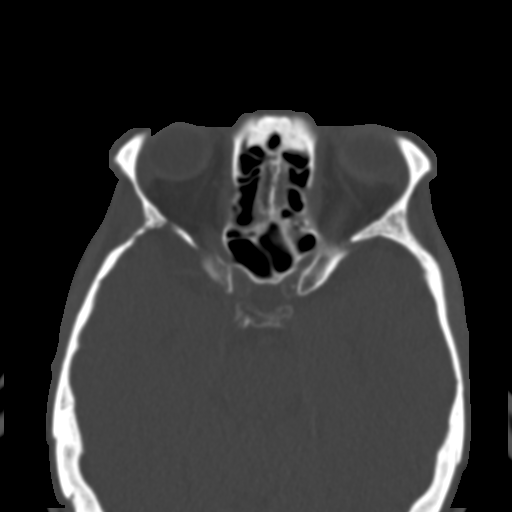
[im 88/103  bone]
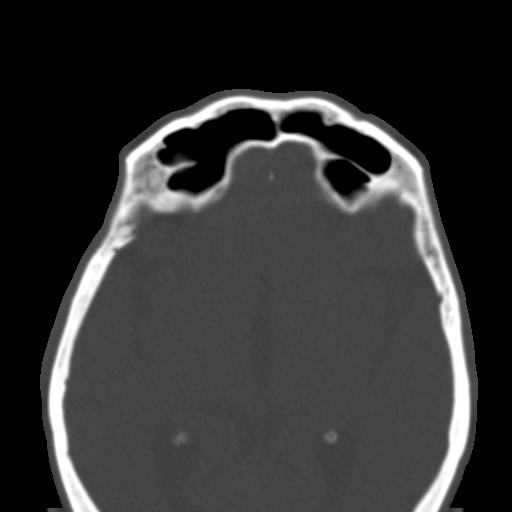
[im 95/103  bone]
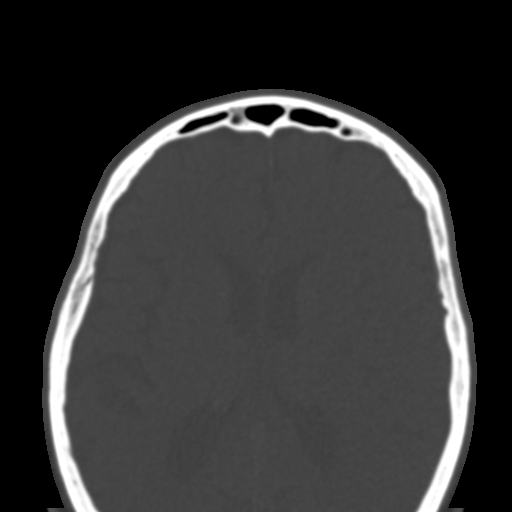

[Series 7: coronal soft · coronal · 0.43mm/px · 3 of 82 slices shown]
[im 28/82  bone]
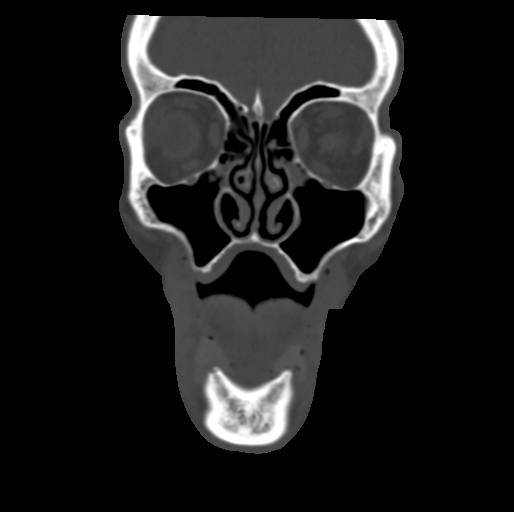
[im 37/82  bone]
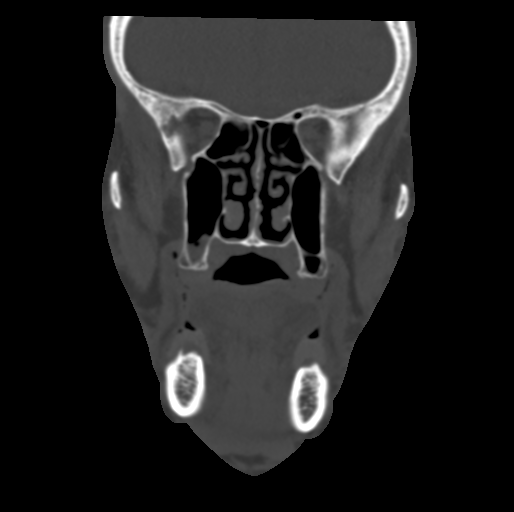
[im 46/82  bone]
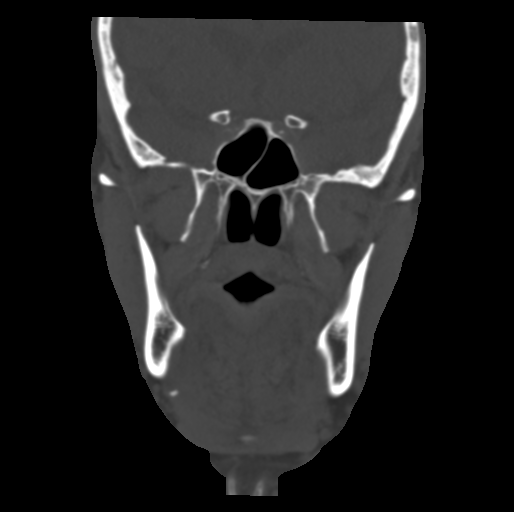

[Series 10: sagittal bone · sagittal · 0.45mm/px · 1 of 81 slices shown]
[im 41/81  bone]
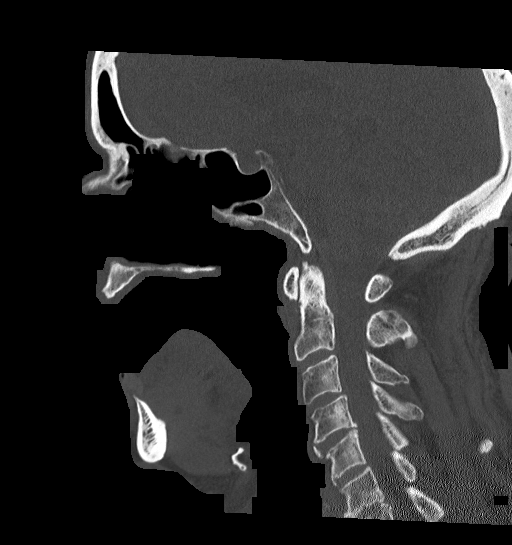

[16 of 47 positions shown; findings below may reference images not displayed]

FINDINGS: Osseous: No fracture or mandibular dislocation. No destructive
process.

Orbits: Negative. No traumatic or inflammatory finding.

Sinuses: Mild ethmoid sinus mucosal thickening. Sinuses otherwise
clear with no fluid levels. Clear mastoid air cells and middle ear
cavities.

Soft tissues: Left preseptal periorbital and infraorbital soft
tissue swelling. No masses or adenopathy.

Limited intracranial: No significant or unexpected finding.
IMPRESSION: 1. Left periorbital and infraorbital soft tissue swelling.
2. No fractures.  No injury to either globe or postseptal orbit.

## 2020-03-19 ENCOUNTER — Other Ambulatory Visit: Payer: Self-pay

## 2020-03-19 ENCOUNTER — Ambulatory Visit
Admission: RE | Admit: 2020-03-19 | Discharge: 2020-03-19 | Disposition: A | Discharge status: Home / Self Care | Payer: PPO | Source: Ambulatory Visit | Attending: Specialist | Admitting: Specialist

## 2020-03-19 DIAGNOSIS — R0600 Dyspnea, unspecified: Secondary | ICD-10-CM | POA: Insufficient documentation

## 2020-03-19 DIAGNOSIS — J439 Emphysema, unspecified: Secondary | ICD-10-CM | POA: Diagnosis not present

## 2020-03-19 DIAGNOSIS — R0609 Other forms of dyspnea: Secondary | ICD-10-CM

## 2020-03-19 DIAGNOSIS — R911 Solitary pulmonary nodule: Secondary | ICD-10-CM

## 2020-05-04 DIAGNOSIS — Z125 Encounter for screening for malignant neoplasm of prostate: Secondary | ICD-10-CM | POA: Diagnosis not present

## 2020-05-04 DIAGNOSIS — I10 Essential (primary) hypertension: Secondary | ICD-10-CM | POA: Diagnosis not present

## 2020-05-04 DIAGNOSIS — Z79899 Other long term (current) drug therapy: Secondary | ICD-10-CM | POA: Diagnosis not present

## 2020-05-04 DIAGNOSIS — E785 Hyperlipidemia, unspecified: Secondary | ICD-10-CM | POA: Diagnosis not present

## 2020-05-04 DIAGNOSIS — E1159 Type 2 diabetes mellitus with other circulatory complications: Secondary | ICD-10-CM | POA: Diagnosis not present

## 2020-05-04 DIAGNOSIS — E1169 Type 2 diabetes mellitus with other specified complication: Secondary | ICD-10-CM | POA: Diagnosis not present

## 2020-05-11 DIAGNOSIS — I700 Atherosclerosis of aorta: Secondary | ICD-10-CM | POA: Diagnosis not present

## 2020-05-11 DIAGNOSIS — Z79899 Other long term (current) drug therapy: Secondary | ICD-10-CM | POA: Diagnosis not present

## 2020-05-11 DIAGNOSIS — I480 Paroxysmal atrial fibrillation: Secondary | ICD-10-CM | POA: Diagnosis not present

## 2020-05-11 DIAGNOSIS — J449 Chronic obstructive pulmonary disease, unspecified: Secondary | ICD-10-CM | POA: Diagnosis not present

## 2020-05-11 DIAGNOSIS — I714 Abdominal aortic aneurysm, without rupture: Secondary | ICD-10-CM | POA: Diagnosis not present

## 2020-05-11 DIAGNOSIS — E785 Hyperlipidemia, unspecified: Secondary | ICD-10-CM | POA: Diagnosis not present

## 2020-05-11 DIAGNOSIS — Z1331 Encounter for screening for depression: Secondary | ICD-10-CM | POA: Diagnosis not present

## 2020-05-11 DIAGNOSIS — I739 Peripheral vascular disease, unspecified: Secondary | ICD-10-CM | POA: Diagnosis not present

## 2020-05-11 DIAGNOSIS — I10 Essential (primary) hypertension: Secondary | ICD-10-CM | POA: Diagnosis not present

## 2020-05-11 DIAGNOSIS — E1169 Type 2 diabetes mellitus with other specified complication: Secondary | ICD-10-CM | POA: Diagnosis not present

## 2020-05-11 DIAGNOSIS — Z0000 Encounter for general adult medical examination without abnormal findings: Secondary | ICD-10-CM | POA: Diagnosis not present

## 2020-05-11 DIAGNOSIS — R972 Elevated prostate specific antigen [PSA]: Secondary | ICD-10-CM | POA: Diagnosis not present

## 2020-06-04 DIAGNOSIS — D2370 Other benign neoplasm of skin of unspecified lower limb, including hip: Secondary | ICD-10-CM | POA: Diagnosis not present

## 2020-06-04 DIAGNOSIS — I739 Peripheral vascular disease, unspecified: Secondary | ICD-10-CM | POA: Diagnosis not present

## 2020-06-04 DIAGNOSIS — E119 Type 2 diabetes mellitus without complications: Secondary | ICD-10-CM | POA: Diagnosis not present

## 2020-06-04 DIAGNOSIS — B351 Tinea unguium: Secondary | ICD-10-CM | POA: Diagnosis not present

## 2020-06-11 ENCOUNTER — Other Ambulatory Visit: Payer: Self-pay

## 2020-06-11 ENCOUNTER — Encounter: Payer: Self-pay | Admitting: Emergency Medicine

## 2020-06-11 ENCOUNTER — Emergency Department: Payer: PPO

## 2020-06-11 ENCOUNTER — Emergency Department
Admission: EM | Admit: 2020-06-11 | Discharge: 2020-06-11 | Disposition: A | Discharge status: Home / Self Care | Payer: PPO | Attending: Emergency Medicine | Admitting: Emergency Medicine

## 2020-06-11 DIAGNOSIS — Z79899 Other long term (current) drug therapy: Secondary | ICD-10-CM | POA: Diagnosis not present

## 2020-06-11 DIAGNOSIS — Z85828 Personal history of other malignant neoplasm of skin: Secondary | ICD-10-CM | POA: Diagnosis not present

## 2020-06-11 DIAGNOSIS — Z955 Presence of coronary angioplasty implant and graft: Secondary | ICD-10-CM | POA: Insufficient documentation

## 2020-06-11 DIAGNOSIS — Z7984 Long term (current) use of oral hypoglycemic drugs: Secondary | ICD-10-CM | POA: Diagnosis not present

## 2020-06-11 DIAGNOSIS — Z7982 Long term (current) use of aspirin: Secondary | ICD-10-CM | POA: Insufficient documentation

## 2020-06-11 DIAGNOSIS — I2510 Atherosclerotic heart disease of native coronary artery without angina pectoris: Secondary | ICD-10-CM | POA: Diagnosis not present

## 2020-06-11 DIAGNOSIS — J90 Pleural effusion, not elsewhere classified: Secondary | ICD-10-CM | POA: Diagnosis not present

## 2020-06-11 DIAGNOSIS — E119 Type 2 diabetes mellitus without complications: Secondary | ICD-10-CM | POA: Insufficient documentation

## 2020-06-11 DIAGNOSIS — Z87891 Personal history of nicotine dependence: Secondary | ICD-10-CM | POA: Diagnosis not present

## 2020-06-11 DIAGNOSIS — I10 Essential (primary) hypertension: Secondary | ICD-10-CM | POA: Diagnosis not present

## 2020-06-11 DIAGNOSIS — I159 Secondary hypertension, unspecified: Secondary | ICD-10-CM

## 2020-06-11 LAB — COMPREHENSIVE METABOLIC PANEL
ALT: 19 U/L (ref 0–44)
AST: 25 U/L (ref 15–41)
Albumin: 4 g/dL (ref 3.5–5.0)
Alkaline Phosphatase: 83 U/L (ref 38–126)
Anion gap: 10 (ref 5–15)
BUN: 12 mg/dL (ref 8–23)
CO2: 28 mmol/L (ref 22–32)
Calcium: 9.2 mg/dL (ref 8.9–10.3)
Chloride: 101 mmol/L (ref 98–111)
Creatinine, Ser: 0.89 mg/dL (ref 0.61–1.24)
GFR calc Af Amer: >60 mL/min (ref >60)
GFR calc non Af Amer: >60 mL/min (ref >60)
Glucose, Bld: 231 mg/dL — ABNORMAL HIGH (ref 70–99)
Potassium: 4.1 mmol/L (ref 3.5–5.1)
Sodium: 139 mmol/L (ref 135–145)
Total Bilirubin: 1 mg/dL (ref 0.3–1.2)
Total Protein: 6.8 g/dL (ref 6.5–8.1)

## 2020-06-11 LAB — TROPONIN I (HIGH SENSITIVITY)
Troponin I (High Sensitivity): 5 ng/L (ref <18)
Troponin I (High Sensitivity): 6 ng/L (ref <18)

## 2020-06-11 LAB — CBC
HCT: 39.7 % (ref 39.0–52.0)
Hemoglobin: 13 g/dL (ref 13.0–17.0)
MCH: 29.3 pg (ref 26.0–34.0)
MCHC: 32.7 g/dL (ref 30.0–36.0)
MCV: 89.6 fL (ref 80.0–100.0)
Platelets: 124 10*3/uL — ABNORMAL LOW (ref 150–400)
RBC: 4.43 MIL/uL (ref 4.22–5.81)
RDW: 14.5 % (ref 11.5–15.5)
WBC: 4.2 10*3/uL (ref 4.0–10.5)
nRBC: 0 % (ref 0.0–0.2)

## 2020-06-11 MED ORDER — HYDROCHLOROTHIAZIDE 12.5 MG PO TABS
12.5000 mg | ORAL_TABLET | Freq: Every day | ORAL | 0 refills | Status: DC
Start: 2020-06-11 — End: 2021-12-09

## 2020-06-11 NOTE — ED Provider Notes (Signed)
[facility] Emergency Department Provider Note  ____________________________________________   First MD Initiated Contact with Patient 06/11/20 1801     (approximate)  I have reviewed the triage vital signs and the nursing notes.   HISTORY  Chief Complaint Hypertension    HPI Bryan Strong is a 74 y.o. male with A. fib, aortic aneurysm sleep apnea on CPAP, diabetes who comes in with high blood pressure.  Patient states that because his wife was checking her blood pressures that he wanted to check his blood pressure.  He notes that it was elevated. patient went to a walk-in clinic and sent him here due to a blood pressure of 225/91.  Patient states that he is compliant with his blood pressure medicine.  He denies any symptoms at this time.  He does have a lot of external stressors with his wife recently be admitted to the hospital. He states that he also recently had family in town. He denies any symptoms he just noticed the blood pressure was elevated today, constant, nothing makes better, nothing makes it worse. Denies any headaches, abdominal pain, chest pain, blurry vision.    For patient's AAA last measurement was 3.35 on 7/6. During this visit patient's blood pressure was 125/78. However prior to that back in August he is 150/90  On review of records patient's blood pressure medication include lisinopril 10 mg twice daily, metoprolol 12.5 mg twice daily.             Past Medical History:  Diagnosis Date  . A-fib ([place])   . Achalasia of esophagus   . Allergic rhinitis   . Anemia   . Anginal pain ([place])   . Aortic aneurysm ([place])    followed by Dr. Franchot Gallo  . Coronary artery disease   . Diabetes mellitus without complication ([place])   . Dyspnea   . Emphysema of lung ([place])   . GERD (gastroesophageal reflux disease)   . Heart murmur   . Hypercholesteremia   . Hypertension   . Kidney stones   . Skin cancer   . Sleep apnea    CPAP  . Wears  dentures    full upper and lower    Patient Active Problem List   Diagnosis Date Noted  . AAA (abdominal aortic aneurysm) without rupture ([place]) 02/27/2017  . Essential hypertension 02/27/2017  . Diabetes ([place]) 02/27/2017  . Pure hypercholesterolemia 02/27/2017    Past Surgical History:  Procedure Laterality Date  . CHOLECYSTECTOMY    . colon polyps    . COLONOSCOPY WITH PROPOFOL N/A 09/18/2017   Procedure: COLONOSCOPY WITH PROPOFOL;  Surgeon: Manya Silvas, MD;  Location: [facility] ENDOSCOPY;  Service: Endoscopy;  Laterality: N/A;  . CORONARY ANGIOPLASTY WITH STENT PLACEMENT  01/03/11   [facility]  Dr. Saralyn Pilar  . ECTROPION REPAIR Bilateral 05/03/2016   Procedure: REPAIR OF ECTROPION BILATERAL LOWER EYELIDS;  Surgeon: Karle Starch, MD;  Location: [place];  Service: Ophthalmology;  Laterality: Bilateral;  . ESOPHAGOGASTRODUODENOSCOPY (EGD) WITH PROPOFOL N/A 07/21/2017   Procedure: ESOPHAGOGASTRODUODENOSCOPY (EGD) WITH PROPOFOL;  Surgeon: Manya Silvas, MD;  Location[facility] ENDOSCOPY;  Service: Endoscopy;  Laterality: N/A;  . ESOPHAGOGASTRODUODENOSCOPY (EGD) WITH PROPOFOL Bilateral 10/18/2017   Procedure: ESOPHAGOGASTRODUODENOSCOPY (EGD) WITH PROPOFOL;  Surgeon: Manya Silvas, MD;  Location: [facility] ENDOSCOPY;  Service: Endoscopy;  Laterality: Bilateral;  . HERNIA REPAIR    . LEFT HEART CATH Left 06/08/2017   Procedure: Left Heart Cath and Coronary Angiography;  Surgeon: Yolonda Kida,  MD;  Location: [place] CV LAB;  Service: Cardiovascular;  Laterality: Left;  . LID LESION EXCISION Bilateral 05/03/2016   Procedure: ,EXCISION CYST RIGHT UPPER EYELID;  Surgeon: Karle Starch, MD;  Location: [place];  Service: Ophthalmology;  Laterality: Bilateral;  Diabetic - oral meds CPAP  . NASAL SEPTUM SURGERY  1967  . SAVORY DILATION N/A 07/21/2017   Procedure: SAVORY DILATION;  Surgeon: Manya Silvas, MD;  Location: [facility] ENDOSCOPY;  Service: Endoscopy;  Laterality: N/A;     Prior to Admission medications   Medication Sig Start Date End Date Taking? Authorizing Provider  ascorbic acid (VITAMIN C) 500 MG tablet Take 500 mg by mouth daily.    [provider]  aspirin 325 MG tablet Take 325 mg by mouth daily.    [provider]  azelastine (ASTELIN) 0.1 % nasal spray Place 1 spray into both nostrils 2 (two) times daily. Use in each nostril as directed     [provider]  calcium carbonate (OS-CAL) 1250 (500 Ca) MG chewable tablet Chew 2 tablets by mouth daily as needed.     [provider]  clopidogrel (PLAVIX) 75 MG tablet Take 75 mg by mouth daily.    [provider]  fexofenadine (ALLEGRA) 180 MG tablet Take 180 mg by mouth daily.    [provider]  Flaxseed, Linseed, (FLAXSEED OIL PO) Take 1 tablet by mouth 2 (two) times daily.     [provider]  fluticasone (FLONASE) 50 MCG/ACT nasal spray Place 1 spray into both nostrils 2 (two) times daily.     [provider]  glipiZIDE (GLUCOTROL XL) 10 MG 24 hr tablet Take 10 mg by mouth daily. 06/12/18 06/24/19  [provider]  lisinopril (PRINIVIL,ZESTRIL) 10 MG tablet Take 10 mg by mouth 2 (two) times daily.    [provider]  metFORMIN (GLUCOPHAGE) 1000 MG tablet Take 1,000 mg by mouth 2 (two) times daily with a meal.    [provider]  metoprolol tartrate (LOPRESSOR) 25 MG tablet Take 12.5 mg by mouth 2 (two) times daily.    [provider]  Multiple Vitamin (MULTIVITAMIN WITH MINERALS) TABS tablet Take 1 tablet by mouth daily.    [provider]  olopatadine (PATANOL) 0.1 % ophthalmic solution Place 1 drop into both eyes 2 (two) times daily.    [provider]  pantoprazole (PROTONIX) 40 MG tablet Take 40 mg by mouth daily before breakfast.     [provider]  Polyethyl Glycol-Propyl Glycol (SYSTANE ULTRA OP) Apply 2 drops to eye 5 (five) times daily as needed (dry eyes).     [provider]  ranolazine (RANEXA) 500 MG 12 hr tablet Take 500 mg by mouth 2 (two) times daily.    [provider]  simvastatin (ZOCOR) 40 MG tablet Take 40 mg by mouth every evening.     [provider]  sucralfate (CARAFATE) 1 g tablet DISSOLVE ONE TABLET IN ONE OUNCE OF WATER AND TAKE THREE TIMES PER DAY 30 MINUTES BEFORE MEALS 11/14/17   [provider]    Allergies Other  Family History  Problem Relation Age of Onset  . Diabetes Brother   . Diabetes Maternal Aunt   . Diabetes Maternal Uncle   . Diabetes Maternal Grandmother     Social History Social History   Tobacco Use  . Smoking status: Former Smoker    Packs/day: 1.00    Years: 56.00    Pack years: 56.00  Types: Cigarettes    Quit date: 11/04/2015    Years since quitting: 4.6  . Smokeless tobacco: Never Used  Vaping Use  . Vaping Use: Never used  Substance Use Topics  . Alcohol use: No  . Drug use: No      Review of Systems Constitutional: No fever/chills, high blood pressure Eyes: No visual changes. ENT: No sore throat. Cardiovascular: Denies chest pain. Respiratory: Denies shortness of breath. Gastrointestinal: No abdominal pain.  No nausea, no vomiting.  No diarrhea.  No constipation. Genitourinary: Negative for dysuria. Musculoskeletal: Negative for back pain. Skin: Negative for rash. Neurological: Negative for headaches, focal weakness or numbness. All other ROS negative ____________________________________________   PHYSICAL EXAM:  VITAL SIGNS: ED Triage Vitals [06/11/20 1242]  Enc Vitals Group     BP (!) 225/91     Pulse Rate 64     Resp 18     Temp 98.1 F (36.7 C)     Temp Source Oral     SpO2 99 %     Weight 190 lb (86.2 kg)     Height 5\' 10"  (1.778 m)     Head Circumference      Peak Flow      Pain Score 0     Pain Loc      Pain Edu?      Excl. in [place]?     Constitutional: Alert and oriented. Well appearing and in no acute  distress. Eyes: Conjunctivae are normal. EOMI. Head: Atraumatic. Nose: No congestion/rhinnorhea. Mouth/Throat: Mucous membranes are moist.   Neck: No stridor. Trachea Midline. FROM Cardiovascular: Normal rate, regular rhythm. Grossly normal heart sounds.  Good peripheral circulation. Respiratory: Normal respiratory effort.  No retractions. Lungs CTAB. Gastrointestinal: Soft and nontender. No distention. No abdominal bruits.  Musculoskeletal: No lower extremity tenderness nor edema.  No joint effusions. Neurologic:  Normal speech and language. No gross focal neurologic deficits are appreciated. Equal strength in arms and legs. No deficits noted Skin:  Skin is warm, dry and intact. No rash noted. Psychiatric: Mood and affect are normal. Speech and behavior are normal. GU: Deferred   ____________________________________________   LABS (all labs ordered are listed, but only abnormal results are displayed)  Labs Reviewed  CBC - Abnormal; Notable for the following components:      Result Value   Platelets 124 (*)    All other components within normal limits  COMPREHENSIVE METABOLIC PANEL - Abnormal; Notable for the following components:   Glucose, Bld 231 (*)    All other components within normal limits  TROPONIN I (HIGH SENSITIVITY)  TROPONIN I (HIGH SENSITIVITY)   ____________________________________________   ED ECG REPORT I, Vanessa [place], the attending physician, personally viewed and interpreted this ECG.  Normal sinus rate of 64, no ST elevation, no T wave inversions, normal intervals ____________________________________________  RADIOLOGY Robert Bellow, personally viewed and evaluated these images (plain radiographs) as part of my medical decision making, as well as reviewing the written report by the radiologist.  ED MD interpretation: No focal consolidation.  Maybe some very tiny pleural effusions  Official radiology report(s): DG Chest 2 View  Result Date:  06/11/2020 CLINICAL DATA:  Hypertension. EXAM: CHEST - 2 VIEW COMPARISON:  No prior. FINDINGS: Mediastinum and hilar structures normal. Heart size stable. Lungs are clear. Small bilateral pleural effusions. No pneumothorax. IMPRESSION: Tiny bilateral pleural effusions can not be excluded. Exam otherwise unremarkable. Electronically Signed   By: Marcello Moores  Register   On: 06/11/2020 13:35  ____________________________________________   PROCEDURES  Procedure(s) performed (including Critical Care):  Procedures   ____________________________________________   INITIAL IMPRESSION / ASSESSMENT AND PLAN / ED COURSE  Bryan Strong was evaluated in Emergency Department on 06/11/2020 for the symptoms described in the history of present illness. He was evaluated in the context of the global COVID-19 pandemic, which necessitated consideration that the patient might be at risk for infection with the SARS-CoV-2 virus that causes COVID-19. Institutional protocols and algorithms that pertain to the evaluation of patients at risk for COVID-19 are in a state of rapid change based on information released by regulatory bodies including the CDC and federal and state organizations. These policies and algorithms were followed during the patient's care in the ED.    Patient presents with asymptomatic hypertension. Patient does have a lot of stress that could be contributing. Labs are ordered in triage to evaluate for AKI, ACS, pleural effusions. He has no abdominal pain or back pain to suggest AAA rupture. He denies any symptoms at this time.  Cardiac markers negative x2. Glucose slightly elevated at 231 but no evidence of DKA Platelets slightly low at 124 which is stable from prior Chest x-ray some very minimal to no effusions. Discussed with patient. No evidence of significant heart failure. He has some trace edema in his legs.  Patient is already on metoprolol and I cannot go up on it stating that he had extreme  fatigue on the higher dose and his heart rate is in the 60s. Patient is already on a pretty high dose of lisinopril 10 mg twice daily. I think the safest medication to start would be low-dose hydrochlorothiazide. Did review and his blood pressure was not elevated last few months ago so we discussed that he should check his blood pressure in the morning prior to taking the hydrochlorothiazide and only taking this over 432 systolic. We discussed that he needs to follow-up with his primary care doctor next week for blood pressure recheck to see it needs to be further adjusted. Patient expressed understanding was comfortable with this plan understands he should return to ER if he develops symptoms  I discussed the provisional nature of ED diagnosis, the treatment so far, the ongoing plan of care, follow up appointments and return precautions with the patient and any family or support people present. They expressed understanding and agreed with the plan, discharged home.         ____________________________________________   FINAL CLINICAL IMPRESSION(S) / ED DIAGNOSES   Final diagnoses:  Secondary hypertension      MEDICATIONS GIVEN DURING THIS VISIT:  Medications - No data to display   ED Discharge Orders         Ordered    hydrochlorothiazide (HYDRODIURIL) 12.5 MG tablet  Daily     Discontinue  Reprint     06/11/20 1838           Note:  This document was prepared using Dragon voice recognition software and may include unintentional dictation errors.   Vanessa Napa, MD 06/11/20 [phone]

## 2020-06-11 NOTE — Discharge Instructions (Addendum)
Take your blood pressure once in the morning. If the top number is above 703 systolic you can take your hydrochlorothiazide. Take your blood pressure once at nighttime. Record blood pressures and whether or not you had to take the medication. Take this to your primary care doctor next week. Please call them tomorrow and see you need an ER follow-up for early next week to have your blood pressure recheck. They may want to adjust this further. Return to the ER if you develop symptoms with your high blood pressure including chest pain, abdominal pain or any other concerns

## 2020-06-11 NOTE — ED Triage Notes (Addendum)
Pt presents to ED via POV with c/o high blood pressure. Pt states his wife just checked hers, so he checked his. Pt states initially went to walk in clinic and was referred here. Pt states hx of HTN, and has been taking HTN medication. Upon arrival to ED pt BP 225/91, pt states is asymptomatic at this time.   Pt reports multiple external stressors at this time with wife's recent admission to the hospital and his wife currently being treated for HTN.

## 2020-06-15 DIAGNOSIS — I10 Essential (primary) hypertension: Secondary | ICD-10-CM | POA: Diagnosis not present

## 2020-06-15 DIAGNOSIS — F439 Reaction to severe stress, unspecified: Secondary | ICD-10-CM | POA: Diagnosis not present

## 2020-06-15 DIAGNOSIS — I480 Paroxysmal atrial fibrillation: Secondary | ICD-10-CM | POA: Diagnosis not present

## 2020-06-15 DIAGNOSIS — E1169 Type 2 diabetes mellitus with other specified complication: Secondary | ICD-10-CM | POA: Diagnosis not present

## 2020-06-15 DIAGNOSIS — E785 Hyperlipidemia, unspecified: Secondary | ICD-10-CM | POA: Diagnosis not present

## 2020-06-15 DIAGNOSIS — I2510 Atherosclerotic heart disease of native coronary artery without angina pectoris: Secondary | ICD-10-CM | POA: Diagnosis not present

## 2020-06-25 DIAGNOSIS — H5213 Myopia, bilateral: Secondary | ICD-10-CM | POA: Diagnosis not present

## 2020-06-25 DIAGNOSIS — E119 Type 2 diabetes mellitus without complications: Secondary | ICD-10-CM | POA: Diagnosis not present

## 2020-06-25 DIAGNOSIS — H52223 Regular astigmatism, bilateral: Secondary | ICD-10-CM | POA: Diagnosis not present

## 2020-06-25 DIAGNOSIS — Z7984 Long term (current) use of oral hypoglycemic drugs: Secondary | ICD-10-CM | POA: Diagnosis not present

## 2020-06-25 DIAGNOSIS — H524 Presbyopia: Secondary | ICD-10-CM | POA: Diagnosis not present

## 2020-06-25 DIAGNOSIS — H2513 Age-related nuclear cataract, bilateral: Secondary | ICD-10-CM | POA: Diagnosis not present

## 2020-06-28 NOTE — Progress Notes (Signed)
MRN : [id number]  Bryan Strong is a 74 y.o. ([birth year]) male who presents with chief complaint of No chief complaint on file. Marland Kitchen  History of Present Illness:   The patient returns to the office for surveillance of a known abdominal aortic aneurysm. Patient denies abdominal pain or back pain, no other abdominal complaints. No changes suggesting embolic episodes.   There have been no interval changes in the patient's overall health care since his last visit.  Patient denies amaurosis fugax or TIA symptoms. There is no history of claudication or rest pain symptoms of the lower extremities. The patient denies angina or shortness of breath.   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  Duplex US of the aorta and iliac arteries from 06/24/2019 showed an AAA measured3.20cm with 1.47 cm rightiliac artery aneurysm.  No outpatient medications have been marked as taking for the 06/29/20 encounter (Appointment) with Delana Meyer, Dolores Lory, MD.    Past Medical History:  Diagnosis Date  . A-fib ([place])   . Achalasia of esophagus   . Allergic rhinitis   . Anemia   . Anginal pain ([place])   . Aortic aneurysm ([place])    followed by Dr. Franchot Gallo  . Coronary artery disease   . Diabetes mellitus without complication ([place])   . Dyspnea   . Emphysema of lung ([place])   . GERD (gastroesophageal reflux disease)   . Heart murmur   . Hypercholesteremia   . Hypertension   . Kidney stones   . Skin cancer   . Sleep apnea    CPAP  . Wears dentures    full upper and lower    Past Surgical History:  Procedure Laterality Date  . CHOLECYSTECTOMY    . colon polyps    . COLONOSCOPY WITH PROPOFOL N/A 09/18/2017   Procedure: COLONOSCOPY WITH PROPOFOL;  Surgeon: Manya Silvas, MD;  Location[facility] ENDOSCOPY;  Service: Endoscopy;  Laterality: N/A;  . CORONARY ANGIOPLASTY WITH STENT PLACEMENT  01/03/11   [facility]  Dr. Saralyn Pilar  .  ECTROPION REPAIR Bilateral 05/03/2016   Procedure: REPAIR OF ECTROPION BILATERAL LOWER EYELIDS;  Surgeon: Karle Starch, MD;  Location: [place];  Service: Ophthalmology;  Laterality: Bilateral;  . ESOPHAGOGASTRODUODENOSCOPY (EGD) WITH PROPOFOL N/A 07/21/2017   Procedure: ESOPHAGOGASTRODUODENOSCOPY (EGD) WITH PROPOFOL;  Surgeon: Manya Silvas, MD;  Location: [facility] ENDOSCOPY;  Service: Endoscopy;  Laterality: N/A;  . ESOPHAGOGASTRODUODENOSCOPY (EGD) WITH PROPOFOL Bilateral 10/18/2017   Procedure: ESOPHAGOGASTRODUODENOSCOPY (EGD) WITH PROPOFOL;  Surgeon: Manya Silvas, MD;  Location: [facility] ENDOSCOPY;  Service: Endoscopy;  Laterality: Bilateral;  . HERNIA REPAIR    . LEFT HEART CATH Left 06/08/2017   Procedure: Left Heart Cath and Coronary Angiography;  Surgeon: Yolonda Kida, MD;  Location: [place] CV LAB;  Service: Cardiovascular;  Laterality: Left;  . LID LESION EXCISION Bilateral 05/03/2016   Procedure: ,EXCISION CYST RIGHT UPPER EYELID;  Surgeon: Karle Starch, MD;  Location: [place];  Service: Ophthalmology;  Laterality: Bilateral;  Diabetic - oral meds CPAP  . NASAL SEPTUM SURGERY  1967  . SAVORY DILATION N/A 07/21/2017   Procedure: SAVORY DILATION;  Surgeon: Manya Silvas, MD;  Location: [facility] ENDOSCOPY;  Service: Endoscopy;  Laterality: N/A;    Social History Social History   Tobacco Use  . Smoking status: Former Smoker    Packs/day: 1.00    Years: 56.00    Pack years: 56.00  Types: Cigarettes    Quit date: 11/04/2015    Years since quitting: 4.6  . Smokeless tobacco: Never Used  Vaping Use  . Vaping Use: Never used  Substance Use Topics  . Alcohol use: No  . Drug use: No    Family History Family History  Problem Relation Age of Onset  . Diabetes Brother   . Diabetes Maternal Aunt   . Diabetes Maternal Uncle   . Diabetes Maternal Grandmother     Allergies  Allergen Reactions  . Other Rash    ERYTHROMYCIN OINTMENT "some nerve  pill, caused HTN"     REVIEW OF SYSTEMS (Negative unless checked)  Constitutional: [] Weight loss  [] Fever  [] Chills Cardiac: [] Chest pain   [] Chest pressure   [] Palpitations   [] Shortness of breath when laying flat   [] Shortness of breath with exertion. Vascular:  [] Pain in legs with walking   [] Pain in legs at rest  [] History of DVT   [] Phlebitis   [] Swelling in legs   [] Varicose veins   [] Non-healing ulcers Pulmonary:   [] Uses home oxygen   [] Productive cough   [] Hemoptysis   [] Wheeze  [] COPD   [] Asthma Neurologic:  [] Dizziness   [] Seizures   [] History of stroke   [] History of TIA  [] Aphasia   [] Vissual changes   [] Weakness or numbness in arm   [] Weakness or numbness in leg Musculoskeletal:   [] Joint swelling   [] Joint pain   [] Low back pain Hematologic:  [] Easy bruising  [] Easy bleeding   [] Hypercoagulable state   [] Anemic Gastrointestinal:  [] Diarrhea   [] Vomiting  [] Gastroesophageal reflux/heartburn   [] Difficulty swallowing. Genitourinary:  [] Chronic kidney disease   [] Difficult urination  [] Frequent urination   [] Blood in urine Skin:  [] Rashes   [] Ulcers  Psychological:  [] History of anxiety   []  History of major depression.  Physical Examination  There were no vitals filed for this visit. There is no height or weight on file to calculate BMI. Gen: WD/WN, NAD Head: [place]/AT, No temporalis wasting.  Ear/Nose/Throat: Hearing grossly intact, nares w/o erythema or drainage Eyes: PER, EOMI, sclera nonicteric.  Neck: Supple, no large masses.   Pulmonary:  Good air movement, no audible wheezing bilaterally, no use of accessory muscles.  Cardiac: RRR, no JVD Vascular:  Vessel Right Left  Radial Palpable Palpable  Gastrointestinal: Non-distended. No guarding/no peritoneal signs.  Musculoskeletal: M/S 5/5 throughout.  No deformity or atrophy.  Neurologic: CN 2-12 intact. Symmetrical.  Speech is fluent. Motor exam as listed above. Psychiatric: Judgment intact, Mood & affect appropriate  for pt's clinical situation. Dermatologic: No rashes or ulcers noted.  No changes consistent with cellulitis.   CBC Lab Results  Component Value Date   WBC 4.2 06/11/2020   HGB 13.0 06/11/2020   HCT 39.7 06/11/2020   MCV 89.6 06/11/2020   PLT 124 (L) 06/11/2020    BMET    Component Value Date/Time   NA 139 06/11/2020 1244   NA 140 01/11/2013 2241   K 4.1 06/11/2020 1244   K 3.7 01/11/2013 2241   CL 101 06/11/2020 1244   CL 105 01/11/2013 2241   CO2 28 06/11/2020 1244   CO2 29 01/11/2013 2241   GLUCOSE 231 (H) 06/11/2020 1244   GLUCOSE 219 (H) 01/11/2013 2241   BUN 12 06/11/2020 1244   BUN 14 01/11/2013 2241   CREATININE 0.89 06/11/2020 1244   CREATININE 1.06 01/11/2013 2241   CALCIUM 9.2 06/11/2020 1244   CALCIUM 9.4 01/11/2013 2241   GFRNONAA >60 06/11/2020 1244  GFRNONAA >60 01/11/2013 2241   GFRAA >60 06/11/2020 1244   GFRAA >60 01/11/2013 2241   CrCl cannot be calculated (Unknown ideal weight.).  COAG No results found for: INR, PROTIME  Radiology DG Chest 2 View  Result Date: 06/11/2020 CLINICAL DATA:  Hypertension. EXAM: CHEST - 2 VIEW COMPARISON:  No prior. FINDINGS: Mediastinum and hilar structures normal. Heart size stable. Lungs are clear. Small bilateral pleural effusions. No pneumothorax. IMPRESSION: Tiny bilateral pleural effusions can not be excluded. Exam otherwise unremarkable. Electronically Signed   By: Marcello Moores  Register   On: 06/11/2020 13:35     Assessment/Plan 1. AAA (abdominal aortic aneurysm) without rupture (HCC) No surgery or intervention at this time.  The patient has an asymptomatic abdominal aortic aneurysm that is less than 4 cm in maximal diameter. I have discussed the natural history of abdominal aortic aneurysm and the small risk of rupture for aneurysm less than 5 cm in size. However, as these small aneurysms tend to enlarge over time, continued surveillance with ultrasound or CT scan is mandatory.   I have also discussed  optimizing medical management with hypertension and lipid control and the importance of abstinence from tobacco. The patient is also encouraged to exercise a minimum of 30 minutes 4 times a week.   Should the patient develop new onset abdominal or back pain or signs of peripheral embolization they are instructed to seek medical attention immediately and to alert the physician providing care that they have an aneurysm.   The patient voices their understanding. The patient will return in 12 months with an aortic duplex.  - VAS US AORTA/IVC/ILIACS; Future  2. Essential hypertension Continue antihypertensive medications as already ordered, these medications have been reviewed and there are no changes at this time.   3. Type 2 diabetes mellitus without complication, without long-term current use of insulin (HCC) Continue hypoglycemic medications as already ordered, these medications have been reviewed and there are no changes at this time.  Hgb A1C to be monitored as already arranged by primary service   4. Pure hypercholesterolemia Continue statin as ordered and reviewed, no changes at this time  Hortencia Pilar, MD  06/28/2020 3:06 PM

## 2020-06-29 ENCOUNTER — Other Ambulatory Visit: Payer: Self-pay

## 2020-06-29 ENCOUNTER — Ambulatory Visit (INDEPENDENT_AMBULATORY_CARE_PROVIDER_SITE_OTHER): Payer: PPO | Admitting: Vascular Surgery

## 2020-06-29 ENCOUNTER — Encounter (INDEPENDENT_AMBULATORY_CARE_PROVIDER_SITE_OTHER): Payer: Self-pay | Admitting: Vascular Surgery

## 2020-06-29 ENCOUNTER — Ambulatory Visit (INDEPENDENT_AMBULATORY_CARE_PROVIDER_SITE_OTHER): Payer: PPO

## 2020-06-29 VITALS — BP 136/76 | HR 70 | Ht 70.0 in | Wt 188.0 lb

## 2020-06-29 DIAGNOSIS — I714 Abdominal aortic aneurysm, without rupture, unspecified: Secondary | ICD-10-CM

## 2020-06-29 DIAGNOSIS — K635 Polyp of colon: Secondary | ICD-10-CM | POA: Insufficient documentation

## 2020-06-29 DIAGNOSIS — Z8379 Family history of other diseases of the digestive system: Secondary | ICD-10-CM | POA: Insufficient documentation

## 2020-06-29 DIAGNOSIS — E119 Type 2 diabetes mellitus without complications: Secondary | ICD-10-CM | POA: Diagnosis not present

## 2020-06-29 DIAGNOSIS — R319 Hematuria, unspecified: Secondary | ICD-10-CM | POA: Insufficient documentation

## 2020-06-29 DIAGNOSIS — E7800 Pure hypercholesterolemia, unspecified: Secondary | ICD-10-CM | POA: Diagnosis not present

## 2020-06-29 DIAGNOSIS — I10 Essential (primary) hypertension: Secondary | ICD-10-CM | POA: Diagnosis not present

## 2020-06-30 ENCOUNTER — Encounter (INDEPENDENT_AMBULATORY_CARE_PROVIDER_SITE_OTHER): Payer: Self-pay | Admitting: Vascular Surgery

## 2020-07-07 DIAGNOSIS — E119 Type 2 diabetes mellitus without complications: Secondary | ICD-10-CM | POA: Diagnosis not present

## 2020-07-07 DIAGNOSIS — I2510 Atherosclerotic heart disease of native coronary artery without angina pectoris: Secondary | ICD-10-CM | POA: Diagnosis not present

## 2020-07-07 DIAGNOSIS — K219 Gastro-esophageal reflux disease without esophagitis: Secondary | ICD-10-CM | POA: Diagnosis not present

## 2020-07-07 DIAGNOSIS — Z955 Presence of coronary angioplasty implant and graft: Secondary | ICD-10-CM | POA: Diagnosis not present

## 2020-07-07 DIAGNOSIS — R001 Bradycardia, unspecified: Secondary | ICD-10-CM | POA: Diagnosis not present

## 2020-07-07 DIAGNOSIS — G4733 Obstructive sleep apnea (adult) (pediatric): Secondary | ICD-10-CM | POA: Diagnosis not present

## 2020-07-07 DIAGNOSIS — M25473 Effusion, unspecified ankle: Secondary | ICD-10-CM | POA: Diagnosis not present

## 2020-07-07 DIAGNOSIS — J449 Chronic obstructive pulmonary disease, unspecified: Secondary | ICD-10-CM | POA: Diagnosis not present

## 2020-07-07 DIAGNOSIS — I480 Paroxysmal atrial fibrillation: Secondary | ICD-10-CM | POA: Diagnosis not present

## 2020-07-07 DIAGNOSIS — E7800 Pure hypercholesterolemia, unspecified: Secondary | ICD-10-CM | POA: Diagnosis not present

## 2020-07-07 DIAGNOSIS — I10 Essential (primary) hypertension: Secondary | ICD-10-CM | POA: Diagnosis not present

## 2020-07-07 DIAGNOSIS — I714 Abdominal aortic aneurysm, without rupture: Secondary | ICD-10-CM | POA: Diagnosis not present

## 2020-08-19 DIAGNOSIS — G4733 Obstructive sleep apnea (adult) (pediatric): Secondary | ICD-10-CM | POA: Diagnosis not present

## 2020-08-19 DIAGNOSIS — R188 Other ascites: Secondary | ICD-10-CM | POA: Diagnosis not present

## 2020-08-19 DIAGNOSIS — R0600 Dyspnea, unspecified: Secondary | ICD-10-CM | POA: Diagnosis not present

## 2020-08-19 DIAGNOSIS — J310 Chronic rhinitis: Secondary | ICD-10-CM | POA: Diagnosis not present

## 2020-08-23 DIAGNOSIS — U071 COVID-19: Secondary | ICD-10-CM | POA: Diagnosis not present

## 2020-08-23 DIAGNOSIS — J441 Chronic obstructive pulmonary disease with (acute) exacerbation: Secondary | ICD-10-CM | POA: Diagnosis not present

## 2020-08-23 DIAGNOSIS — Z20828 Contact with and (suspected) exposure to other viral communicable diseases: Secondary | ICD-10-CM | POA: Diagnosis not present

## 2020-08-28 DIAGNOSIS — U071 COVID-19: Secondary | ICD-10-CM | POA: Diagnosis not present

## 2020-09-04 DIAGNOSIS — U071 COVID-19: Secondary | ICD-10-CM | POA: Diagnosis not present

## 2020-09-04 DIAGNOSIS — I700 Atherosclerosis of aorta: Secondary | ICD-10-CM | POA: Diagnosis not present

## 2020-09-08 DIAGNOSIS — M79671 Pain in right foot: Secondary | ICD-10-CM | POA: Diagnosis not present

## 2020-09-08 DIAGNOSIS — M79672 Pain in left foot: Secondary | ICD-10-CM | POA: Diagnosis not present

## 2020-09-08 DIAGNOSIS — B351 Tinea unguium: Secondary | ICD-10-CM | POA: Diagnosis not present

## 2020-10-08 DIAGNOSIS — K219 Gastro-esophageal reflux disease without esophagitis: Secondary | ICD-10-CM | POA: Diagnosis not present

## 2020-10-08 DIAGNOSIS — I480 Paroxysmal atrial fibrillation: Secondary | ICD-10-CM | POA: Diagnosis not present

## 2020-10-08 DIAGNOSIS — J449 Chronic obstructive pulmonary disease, unspecified: Secondary | ICD-10-CM | POA: Diagnosis not present

## 2020-10-08 DIAGNOSIS — R001 Bradycardia, unspecified: Secondary | ICD-10-CM | POA: Diagnosis not present

## 2020-10-08 DIAGNOSIS — Z8616 Personal history of COVID-19: Secondary | ICD-10-CM | POA: Diagnosis not present

## 2020-10-08 DIAGNOSIS — E7800 Pure hypercholesterolemia, unspecified: Secondary | ICD-10-CM | POA: Diagnosis not present

## 2020-10-08 DIAGNOSIS — R0609 Other forms of dyspnea: Secondary | ICD-10-CM | POA: Diagnosis not present

## 2020-10-08 DIAGNOSIS — I10 Essential (primary) hypertension: Secondary | ICD-10-CM | POA: Diagnosis not present

## 2020-10-08 DIAGNOSIS — G4733 Obstructive sleep apnea (adult) (pediatric): Secondary | ICD-10-CM | POA: Diagnosis not present

## 2020-10-08 DIAGNOSIS — I2510 Atherosclerotic heart disease of native coronary artery without angina pectoris: Secondary | ICD-10-CM | POA: Diagnosis not present

## 2020-10-08 DIAGNOSIS — Z955 Presence of coronary angioplasty implant and graft: Secondary | ICD-10-CM | POA: Diagnosis not present

## 2020-10-08 DIAGNOSIS — R531 Weakness: Secondary | ICD-10-CM | POA: Diagnosis not present

## 2020-11-09 DIAGNOSIS — Z79899 Other long term (current) drug therapy: Secondary | ICD-10-CM | POA: Diagnosis not present

## 2020-11-09 DIAGNOSIS — E1169 Type 2 diabetes mellitus with other specified complication: Secondary | ICD-10-CM | POA: Diagnosis not present

## 2020-11-09 DIAGNOSIS — E785 Hyperlipidemia, unspecified: Secondary | ICD-10-CM | POA: Diagnosis not present

## 2020-11-09 DIAGNOSIS — R972 Elevated prostate specific antigen [PSA]: Secondary | ICD-10-CM | POA: Diagnosis not present

## 2020-11-16 DIAGNOSIS — I2510 Atherosclerotic heart disease of native coronary artery without angina pectoris: Secondary | ICD-10-CM | POA: Diagnosis not present

## 2020-11-16 DIAGNOSIS — I714 Abdominal aortic aneurysm, without rupture: Secondary | ICD-10-CM | POA: Diagnosis not present

## 2020-11-16 DIAGNOSIS — I739 Peripheral vascular disease, unspecified: Secondary | ICD-10-CM | POA: Diagnosis not present

## 2020-11-16 DIAGNOSIS — I480 Paroxysmal atrial fibrillation: Secondary | ICD-10-CM | POA: Diagnosis not present

## 2020-11-16 DIAGNOSIS — J449 Chronic obstructive pulmonary disease, unspecified: Secondary | ICD-10-CM | POA: Diagnosis not present

## 2020-11-16 DIAGNOSIS — E1159 Type 2 diabetes mellitus with other circulatory complications: Secondary | ICD-10-CM | POA: Diagnosis not present

## 2020-11-16 DIAGNOSIS — I152 Hypertension secondary to endocrine disorders: Secondary | ICD-10-CM | POA: Diagnosis not present

## 2020-11-16 DIAGNOSIS — Z125 Encounter for screening for malignant neoplasm of prostate: Secondary | ICD-10-CM | POA: Diagnosis not present

## 2020-11-16 DIAGNOSIS — Z79899 Other long term (current) drug therapy: Secondary | ICD-10-CM | POA: Diagnosis not present

## 2020-11-16 DIAGNOSIS — R972 Elevated prostate specific antigen [PSA]: Secondary | ICD-10-CM | POA: Diagnosis not present

## 2020-11-16 DIAGNOSIS — I700 Atherosclerosis of aorta: Secondary | ICD-10-CM | POA: Diagnosis not present

## 2020-11-16 DIAGNOSIS — E1169 Type 2 diabetes mellitus with other specified complication: Secondary | ICD-10-CM | POA: Diagnosis not present

## 2020-12-08 DIAGNOSIS — B351 Tinea unguium: Secondary | ICD-10-CM | POA: Diagnosis not present

## 2020-12-08 DIAGNOSIS — E119 Type 2 diabetes mellitus without complications: Secondary | ICD-10-CM | POA: Diagnosis not present

## 2020-12-08 DIAGNOSIS — M79672 Pain in left foot: Secondary | ICD-10-CM | POA: Diagnosis not present

## 2020-12-08 DIAGNOSIS — M79671 Pain in right foot: Secondary | ICD-10-CM | POA: Diagnosis not present

## 2020-12-22 DIAGNOSIS — H5213 Myopia, bilateral: Secondary | ICD-10-CM | POA: Diagnosis not present

## 2020-12-22 DIAGNOSIS — H524 Presbyopia: Secondary | ICD-10-CM | POA: Diagnosis not present

## 2020-12-22 DIAGNOSIS — H52223 Regular astigmatism, bilateral: Secondary | ICD-10-CM | POA: Diagnosis not present

## 2020-12-22 DIAGNOSIS — Z7984 Long term (current) use of oral hypoglycemic drugs: Secondary | ICD-10-CM | POA: Diagnosis not present

## 2020-12-22 DIAGNOSIS — H2513 Age-related nuclear cataract, bilateral: Secondary | ICD-10-CM | POA: Diagnosis not present

## 2020-12-22 DIAGNOSIS — E119 Type 2 diabetes mellitus without complications: Secondary | ICD-10-CM | POA: Diagnosis not present

## 2021-02-16 DIAGNOSIS — R918 Other nonspecific abnormal finding of lung field: Secondary | ICD-10-CM | POA: Diagnosis not present

## 2021-02-16 DIAGNOSIS — Z01818 Encounter for other preprocedural examination: Secondary | ICD-10-CM | POA: Diagnosis not present

## 2021-02-16 DIAGNOSIS — G4733 Obstructive sleep apnea (adult) (pediatric): Secondary | ICD-10-CM | POA: Diagnosis not present

## 2021-02-16 DIAGNOSIS — J449 Chronic obstructive pulmonary disease, unspecified: Secondary | ICD-10-CM | POA: Diagnosis not present

## 2021-02-16 DIAGNOSIS — J310 Chronic rhinitis: Secondary | ICD-10-CM | POA: Diagnosis not present

## 2021-03-09 DIAGNOSIS — I739 Peripheral vascular disease, unspecified: Secondary | ICD-10-CM | POA: Diagnosis not present

## 2021-03-09 DIAGNOSIS — M79671 Pain in right foot: Secondary | ICD-10-CM | POA: Diagnosis not present

## 2021-03-09 DIAGNOSIS — M79672 Pain in left foot: Secondary | ICD-10-CM | POA: Diagnosis not present

## 2021-03-09 DIAGNOSIS — E119 Type 2 diabetes mellitus without complications: Secondary | ICD-10-CM | POA: Diagnosis not present

## 2021-03-09 DIAGNOSIS — B351 Tinea unguium: Secondary | ICD-10-CM | POA: Diagnosis not present

## 2021-04-12 DIAGNOSIS — I714 Abdominal aortic aneurysm, without rupture: Secondary | ICD-10-CM | POA: Diagnosis not present

## 2021-04-12 DIAGNOSIS — I10 Essential (primary) hypertension: Secondary | ICD-10-CM | POA: Diagnosis not present

## 2021-04-12 DIAGNOSIS — R001 Bradycardia, unspecified: Secondary | ICD-10-CM | POA: Diagnosis not present

## 2021-04-12 DIAGNOSIS — Z8616 Personal history of COVID-19: Secondary | ICD-10-CM | POA: Diagnosis not present

## 2021-04-12 DIAGNOSIS — Z955 Presence of coronary angioplasty implant and graft: Secondary | ICD-10-CM | POA: Diagnosis not present

## 2021-04-12 DIAGNOSIS — I480 Paroxysmal atrial fibrillation: Secondary | ICD-10-CM | POA: Diagnosis not present

## 2021-04-12 DIAGNOSIS — R0609 Other forms of dyspnea: Secondary | ICD-10-CM | POA: Diagnosis not present

## 2021-04-12 DIAGNOSIS — R531 Weakness: Secondary | ICD-10-CM | POA: Diagnosis not present

## 2021-04-12 DIAGNOSIS — G4733 Obstructive sleep apnea (adult) (pediatric): Secondary | ICD-10-CM | POA: Diagnosis not present

## 2021-04-12 DIAGNOSIS — J449 Chronic obstructive pulmonary disease, unspecified: Secondary | ICD-10-CM | POA: Diagnosis not present

## 2021-04-12 DIAGNOSIS — I2510 Atherosclerotic heart disease of native coronary artery without angina pectoris: Secondary | ICD-10-CM | POA: Diagnosis not present

## 2021-05-11 DIAGNOSIS — I152 Hypertension secondary to endocrine disorders: Secondary | ICD-10-CM | POA: Diagnosis not present

## 2021-05-11 DIAGNOSIS — E785 Hyperlipidemia, unspecified: Secondary | ICD-10-CM | POA: Diagnosis not present

## 2021-05-11 DIAGNOSIS — Z79899 Other long term (current) drug therapy: Secondary | ICD-10-CM | POA: Diagnosis not present

## 2021-05-11 DIAGNOSIS — E1169 Type 2 diabetes mellitus with other specified complication: Secondary | ICD-10-CM | POA: Diagnosis not present

## 2021-05-11 DIAGNOSIS — Z125 Encounter for screening for malignant neoplasm of prostate: Secondary | ICD-10-CM | POA: Diagnosis not present

## 2021-05-11 DIAGNOSIS — E1159 Type 2 diabetes mellitus with other circulatory complications: Secondary | ICD-10-CM | POA: Diagnosis not present

## 2021-05-18 DIAGNOSIS — I2510 Atherosclerotic heart disease of native coronary artery without angina pectoris: Secondary | ICD-10-CM | POA: Diagnosis not present

## 2021-05-18 DIAGNOSIS — R972 Elevated prostate specific antigen [PSA]: Secondary | ICD-10-CM | POA: Diagnosis not present

## 2021-05-18 DIAGNOSIS — E1159 Type 2 diabetes mellitus with other circulatory complications: Secondary | ICD-10-CM | POA: Diagnosis not present

## 2021-05-18 DIAGNOSIS — Z0000 Encounter for general adult medical examination without abnormal findings: Secondary | ICD-10-CM | POA: Diagnosis not present

## 2021-05-18 DIAGNOSIS — I714 Abdominal aortic aneurysm, without rupture: Secondary | ICD-10-CM | POA: Diagnosis not present

## 2021-05-18 DIAGNOSIS — J449 Chronic obstructive pulmonary disease, unspecified: Secondary | ICD-10-CM | POA: Diagnosis not present

## 2021-05-18 DIAGNOSIS — I700 Atherosclerosis of aorta: Secondary | ICD-10-CM | POA: Diagnosis not present

## 2021-05-18 DIAGNOSIS — I480 Paroxysmal atrial fibrillation: Secondary | ICD-10-CM | POA: Diagnosis not present

## 2021-05-18 DIAGNOSIS — I739 Peripheral vascular disease, unspecified: Secondary | ICD-10-CM | POA: Diagnosis not present

## 2021-05-18 DIAGNOSIS — E1169 Type 2 diabetes mellitus with other specified complication: Secondary | ICD-10-CM | POA: Diagnosis not present

## 2021-05-18 DIAGNOSIS — Z1331 Encounter for screening for depression: Secondary | ICD-10-CM | POA: Diagnosis not present

## 2021-05-18 DIAGNOSIS — Z79899 Other long term (current) drug therapy: Secondary | ICD-10-CM | POA: Diagnosis not present

## 2021-05-21 DIAGNOSIS — D2272 Melanocytic nevi of left lower limb, including hip: Secondary | ICD-10-CM | POA: Diagnosis not present

## 2021-05-21 DIAGNOSIS — D225 Melanocytic nevi of trunk: Secondary | ICD-10-CM | POA: Diagnosis not present

## 2021-05-21 DIAGNOSIS — B353 Tinea pedis: Secondary | ICD-10-CM | POA: Diagnosis not present

## 2021-05-21 DIAGNOSIS — D2262 Melanocytic nevi of left upper limb, including shoulder: Secondary | ICD-10-CM | POA: Diagnosis not present

## 2021-05-21 DIAGNOSIS — L728 Other follicular cysts of the skin and subcutaneous tissue: Secondary | ICD-10-CM | POA: Diagnosis not present

## 2021-05-21 DIAGNOSIS — D2239 Melanocytic nevi of other parts of face: Secondary | ICD-10-CM | POA: Diagnosis not present

## 2021-05-21 DIAGNOSIS — L570 Actinic keratosis: Secondary | ICD-10-CM | POA: Diagnosis not present

## 2021-05-21 DIAGNOSIS — X32XXXA Exposure to sunlight, initial encounter: Secondary | ICD-10-CM | POA: Diagnosis not present

## 2021-05-21 DIAGNOSIS — D485 Neoplasm of uncertain behavior of skin: Secondary | ICD-10-CM | POA: Diagnosis not present

## 2021-05-21 DIAGNOSIS — D2261 Melanocytic nevi of right upper limb, including shoulder: Secondary | ICD-10-CM | POA: Diagnosis not present

## 2021-05-21 DIAGNOSIS — L538 Other specified erythematous conditions: Secondary | ICD-10-CM | POA: Diagnosis not present

## 2021-05-21 DIAGNOSIS — D2271 Melanocytic nevi of right lower limb, including hip: Secondary | ICD-10-CM | POA: Diagnosis not present

## 2021-05-21 DIAGNOSIS — L821 Other seborrheic keratosis: Secondary | ICD-10-CM | POA: Diagnosis not present

## 2021-06-17 DIAGNOSIS — E119 Type 2 diabetes mellitus without complications: Secondary | ICD-10-CM | POA: Diagnosis not present

## 2021-06-17 DIAGNOSIS — B353 Tinea pedis: Secondary | ICD-10-CM | POA: Diagnosis not present

## 2021-06-17 DIAGNOSIS — B351 Tinea unguium: Secondary | ICD-10-CM | POA: Diagnosis not present

## 2021-06-17 DIAGNOSIS — M79672 Pain in left foot: Secondary | ICD-10-CM | POA: Diagnosis not present

## 2021-06-17 DIAGNOSIS — M79671 Pain in right foot: Secondary | ICD-10-CM | POA: Diagnosis not present

## 2021-06-24 DIAGNOSIS — H524 Presbyopia: Secondary | ICD-10-CM | POA: Diagnosis not present

## 2021-06-24 DIAGNOSIS — H52223 Regular astigmatism, bilateral: Secondary | ICD-10-CM | POA: Diagnosis not present

## 2021-06-24 DIAGNOSIS — Z7984 Long term (current) use of oral hypoglycemic drugs: Secondary | ICD-10-CM | POA: Diagnosis not present

## 2021-06-24 DIAGNOSIS — H2513 Age-related nuclear cataract, bilateral: Secondary | ICD-10-CM | POA: Diagnosis not present

## 2021-06-24 DIAGNOSIS — H5213 Myopia, bilateral: Secondary | ICD-10-CM | POA: Diagnosis not present

## 2021-06-24 DIAGNOSIS — E119 Type 2 diabetes mellitus without complications: Secondary | ICD-10-CM | POA: Diagnosis not present

## 2021-07-01 ENCOUNTER — Ambulatory Visit (INDEPENDENT_AMBULATORY_CARE_PROVIDER_SITE_OTHER): Payer: PPO | Admitting: Vascular Surgery

## 2021-07-01 ENCOUNTER — Encounter (INDEPENDENT_AMBULATORY_CARE_PROVIDER_SITE_OTHER): Payer: PPO

## 2021-07-14 ENCOUNTER — Other Ambulatory Visit (INDEPENDENT_AMBULATORY_CARE_PROVIDER_SITE_OTHER): Payer: Self-pay | Admitting: Vascular Surgery

## 2021-07-14 DIAGNOSIS — I714 Abdominal aortic aneurysm, without rupture, unspecified: Secondary | ICD-10-CM

## 2021-07-14 NOTE — Progress Notes (Signed)
MRN : [id number]  Bryan Strong is a 75 y.o. ([birth year]) male who presents with chief complaint of No chief complaint on file. Marland Kitchen  History of Present Illness:   The patient returns to the office for surveillance of a known abdominal aortic aneurysm. Patient denies abdominal pain or back pain, no other abdominal complaints. No changes suggesting embolic episodes.   There have been no interval changes in the patient's overall health care since his last visit.   Patient denies amaurosis fugax or TIA symptoms. There is no history of claudication or rest pain symptoms of the lower extremities. The patient denies angina or shortness of breath.   Duplex US of the aorta and iliac arteries today shows an AAA measured 3.25 cm with 1.20 cm left iliac artery aneurysm.   Duplex US of the aorta and iliac arteries from 06/29/2020 showed an AAA measured 3.24 cm with 1.00 cm right iliac artery aneurysm.  No outpatient medications have been marked as taking for the 07/15/21 encounter (Appointment) with Delana Meyer, Dolores Lory, MD.    Past Medical History:  Diagnosis Date   A-fib [facility])    Achalasia of esophagus    Allergic rhinitis    Anemia    Anginal pain ([place])    Aortic aneurysm ([place])    followed by Dr. Franchot Gallo   Coronary artery disease    Diabetes mellitus without complication ([place])    Dyspnea    Emphysema of lung ([place])    GERD (gastroesophageal reflux disease)    Heart murmur    Hypercholesteremia    Hypertension    Kidney stones    Skin cancer    Sleep apnea    CPAP   Wears dentures    full upper and lower    Past Surgical History:  Procedure Laterality Date   CHOLECYSTECTOMY     colon polyps     COLONOSCOPY WITH PROPOFOL N/A 09/18/2017   Procedure: COLONOSCOPY WITH PROPOFOL;  Surgeon: Manya Silvas, MD;  Location: [facility] ENDOSCOPY;  Service: Endoscopy;  Laterality: N/A;   CORONARY ANGIOPLASTY WITH STENT PLACEMENT  01/03/11   [facility]  Dr. Saralyn Pilar   ECTROPION REPAIR Bilateral  05/03/2016   Procedure: REPAIR OF ECTROPION BILATERAL LOWER EYELIDS;  Surgeon: Karle Starch, MD;  Location: [place];  Service: Ophthalmology;  Laterality: Bilateral;   ESOPHAGOGASTRODUODENOSCOPY (EGD) WITH PROPOFOL N/A 07/21/2017   Procedure: ESOPHAGOGASTRODUODENOSCOPY (EGD) WITH PROPOFOL;  Surgeon: Manya Silvas, MD;  Location: [facility] ENDOSCOPY;  Service: Endoscopy;  Laterality: N/A;   ESOPHAGOGASTRODUODENOSCOPY (EGD) WITH PROPOFOL Bilateral 10/18/2017   Procedure: ESOPHAGOGASTRODUODENOSCOPY (EGD) WITH PROPOFOL;  Surgeon: Manya Silvas, MD;  Location: [facility] ENDOSCOPY;  Service: Endoscopy;  Laterality: Bilateral;   HERNIA REPAIR     LEFT HEART CATH Left 06/08/2017   Procedure: Left Heart Cath and Coronary Angiography;  Surgeon: Yolonda Kida, MD;  Location: [place] CV LAB;  Service: Cardiovascular;  Laterality: Left;   LID LESION EXCISION Bilateral 05/03/2016   Procedure: ,EXCISION CYST RIGHT UPPER EYELID;  Surgeon: Karle Starch, MD;  Location: [place];  Service: Ophthalmology;  Laterality: Bilateral;  Diabetic - oral meds CPAP   NASAL SEPTUM SURGERY  1967   SAVORY DILATION N/A 07/21/2017   Procedure: SAVORY DILATION;  Surgeon: Manya Silvas, MD;  Location: [facility] ENDOSCOPY;  Service: Endoscopy;  Laterality: N/A;    Social History Social History   Tobacco Use   Smoking status: Former    Packs/day: 1.00    Years: 56.00  Pack years: 56.00    Types: Cigarettes    Quit date: 11/04/2015    Years since quitting: 5.6   Smokeless tobacco: Never  Vaping Use   Vaping Use: Never used  Substance Use Topics   Alcohol use: No   Drug use: No    Family History Family History  Problem Relation Age of Onset   Diabetes Brother    Diabetes Maternal Aunt    Diabetes Maternal Uncle    Diabetes Maternal Grandmother     Allergies  Allergen Reactions   Erythromycin Base Rash   Other Rash    ERYTHROMYCIN OINTMENT "some nerve pill, caused HTN"      REVIEW OF SYSTEMS (Negative unless checked)  Constitutional: '[]'$ Weight loss  '[]'$ Fever  '[]'$ Chills Cardiac: '[]'$ Chest pain   '[]'$ Chest pressure   '[]'$ Palpitations   '[]'$ Shortness of breath when laying flat   '[]'$ Shortness of breath with exertion. Vascular:  '[]'$ Pain in legs with walking   '[]'$ Pain in legs at rest  '[]'$ History of DVT   '[]'$ Phlebitis   '[]'$ Swelling in legs   '[]'$ Varicose veins   '[]'$ Non-healing ulcers Pulmonary:   '[]'$ Uses home oxygen   '[]'$ Productive cough   '[]'$ Hemoptysis   '[]'$ Wheeze  '[]'$ COPD   '[]'$ Asthma Neurologic:  '[]'$ Dizziness   '[]'$ Seizures   '[]'$ History of stroke   '[]'$ History of TIA  '[]'$ Aphasia   '[]'$ Vissual changes   '[]'$ Weakness or numbness in arm   '[]'$ Weakness or numbness in leg Musculoskeletal:   '[]'$ Joint swelling   '[]'$ Joint pain   '[]'$ Low back pain Hematologic:  '[]'$ Easy bruising  '[]'$ Easy bleeding   '[]'$ Hypercoagulable state   '[]'$ Anemic Gastrointestinal:  '[]'$ Diarrhea   '[]'$ Vomiting  '[]'$ Gastroesophageal reflux/heartburn   '[]'$ Difficulty swallowing. Genitourinary:  '[]'$ Chronic kidney disease   '[]'$ Difficult urination  '[]'$ Frequent urination   '[]'$ Blood in urine Skin:  '[]'$ Rashes   '[]'$ Ulcers  Psychological:  '[]'$ History of anxiety   '[]'$  History of major depression.  Physical Examination  There were no vitals filed for this visit. There is no height or weight on file to calculate BMI. Gen: WD/WN, NAD Head: [place]/AT, No temporalis wasting.  Ear/Nose/Throat: Hearing grossly intact, nares w/o erythema or drainage Eyes: PER, EOMI, sclera nonicteric.  Neck: Supple, no large masses.   Pulmonary:  Good air movement, no audible wheezing bilaterally, no use of accessory muscles.  Cardiac: RRR, no JVD Vascular:   No carotid bruits Vessel Right Left  Radial Palpable Palpable  Popliteal Palpable Palpable  PT Palpable Palpable  DP Palpable Palpable  Gastrointestinal: Non-distended. No guarding/no peritoneal signs.  Musculoskeletal: M/S 5/5 throughout.  No deformity or atrophy.  Neurologic: CN 2-12 intact. Symmetrical.  Speech is fluent. Motor exam  as listed above. Psychiatric: Judgment intact, Mood & affect appropriate for pt's clinical situation. Dermatologic: No rashes or ulcers noted.  No changes consistent with cellulitis. Lymph : No lichenification or skin changes of chronic lymphedema.  CBC Lab Results  Component Value Date   WBC 4.2 06/11/2020   HGB 13.0 06/11/2020   HCT 39.7 06/11/2020   MCV 89.6 06/11/2020   PLT 124 (L) 06/11/2020    BMET    Component Value Date/Time   NA 139 06/11/2020 1244   NA 140 01/11/2013 2241   K 4.1 06/11/2020 1244   K 3.7 01/11/2013 2241   CL 101 06/11/2020 1244   CL 105 01/11/2013 2241   CO2 28 06/11/2020 1244   CO2 29 01/11/2013 2241   GLUCOSE 231 (H) 06/11/2020 1244   GLUCOSE 219 (H) 01/11/2013 2241   BUN 12 06/11/2020 1244  BUN 14 01/11/2013 2241   CREATININE 0.89 06/11/2020 1244   CREATININE 1.06 01/11/2013 2241   CALCIUM 9.2 06/11/2020 1244   CALCIUM 9.4 01/11/2013 2241   GFRNONAA >60 06/11/2020 1244   GFRNONAA >60 01/11/2013 2241   GFRAA >60 06/11/2020 1244   GFRAA >60 01/11/2013 2241   CrCl cannot be calculated (Patient's most recent lab result is older than the maximum 21 days allowed.).  COAG No results found for: INR, PROTIME  Radiology No results found.   Assessment/Plan 1. AAA (abdominal aortic aneurysm) without rupture (HCC) No surgery or intervention at this time.   The patient has an asymptomatic abdominal aortic aneurysm that is less than 4 cm in maximal diameter.  I have discussed the natural history of abdominal aortic aneurysm and the small risk of rupture for aneurysm less than 5 cm in size.  However, as these small aneurysms tend to enlarge over time, continued surveillance with ultrasound or CT scan is mandatory.   I have also discussed optimizing medical management with hypertension and lipid control and the importance of abstinence from tobacco.  The patient is also encouraged to exercise a minimum of 30 minutes 4 times a week.   Should the  patient develop new onset abdominal or back pain or signs of peripheral embolization they are instructed to seek medical attention immediately and to alert the physician providing care that they have an aneurysm.   The patient voices their understanding. The patient will return in 12 months with an aortic duplex. - VAS US AORTA/IVC/ILIACS; Future  2. PVD (peripheral vascular disease) ([place])  Recommend:  The patient has evidence of atherosclerosis of the lower extremities with claudication.  The patient does not voice lifestyle limiting changes at this point in time.  No invasive studies, angiography or surgery at this time The patient should continue walking and begin a more formal exercise program.  The patient should continue antiplatelet therapy and aggressive treatment of the lipid abnormalities  No changes in the patient's medications at this time  The patient should continue wearing graduated compression socks 10-15 mmHg strength to control the mild edema.    3. Essential hypertension Continue antihypertensive medications as already ordered, these medications have been reviewed and there are no changes at this time.   4. Coronary artery disease of native artery of native heart with stable angina pectoris (HCC) Continue cardiac and antihypertensive medications as already ordered and reviewed, no changes at this time.  Continue statin as ordered and reviewed, no changes at this time  Nitrates PRN for chest pain   5. Pure hypercholesterolemia Continue statin as ordered and reviewed, no changes at this time    Hortencia Pilar, MD  07/14/2021 11:38 AM

## 2021-07-15 ENCOUNTER — Ambulatory Visit (INDEPENDENT_AMBULATORY_CARE_PROVIDER_SITE_OTHER): Payer: PPO

## 2021-07-15 ENCOUNTER — Encounter (INDEPENDENT_AMBULATORY_CARE_PROVIDER_SITE_OTHER): Payer: Self-pay | Admitting: Vascular Surgery

## 2021-07-15 ENCOUNTER — Other Ambulatory Visit: Payer: Self-pay

## 2021-07-15 ENCOUNTER — Ambulatory Visit (INDEPENDENT_AMBULATORY_CARE_PROVIDER_SITE_OTHER): Payer: PPO | Admitting: Vascular Surgery

## 2021-07-15 VITALS — BP 116/73 | HR 57 | Ht 70.0 in | Wt 192.0 lb

## 2021-07-15 DIAGNOSIS — I714 Abdominal aortic aneurysm, without rupture, unspecified: Secondary | ICD-10-CM

## 2021-07-15 DIAGNOSIS — I739 Peripheral vascular disease, unspecified: Secondary | ICD-10-CM

## 2021-07-15 DIAGNOSIS — E7800 Pure hypercholesterolemia, unspecified: Secondary | ICD-10-CM

## 2021-07-15 DIAGNOSIS — I25118 Atherosclerotic heart disease of native coronary artery with other forms of angina pectoris: Secondary | ICD-10-CM

## 2021-07-15 DIAGNOSIS — I10 Essential (primary) hypertension: Secondary | ICD-10-CM

## 2021-07-15 DIAGNOSIS — C9000 Multiple myeloma not having achieved remission: Secondary | ICD-10-CM | POA: Insufficient documentation

## 2021-07-15 DIAGNOSIS — Z719 Counseling, unspecified: Secondary | ICD-10-CM | POA: Insufficient documentation

## 2021-08-19 DIAGNOSIS — G4733 Obstructive sleep apnea (adult) (pediatric): Secondary | ICD-10-CM | POA: Diagnosis not present

## 2021-08-19 DIAGNOSIS — R918 Other nonspecific abnormal finding of lung field: Secondary | ICD-10-CM | POA: Diagnosis not present

## 2021-08-19 DIAGNOSIS — J310 Chronic rhinitis: Secondary | ICD-10-CM | POA: Diagnosis not present

## 2021-08-19 DIAGNOSIS — J449 Chronic obstructive pulmonary disease, unspecified: Secondary | ICD-10-CM | POA: Diagnosis not present

## 2021-08-19 DIAGNOSIS — K219 Gastro-esophageal reflux disease without esophagitis: Secondary | ICD-10-CM | POA: Diagnosis not present

## 2021-08-19 DIAGNOSIS — R0609 Other forms of dyspnea: Secondary | ICD-10-CM | POA: Diagnosis not present

## 2021-08-20 ENCOUNTER — Other Ambulatory Visit: Payer: Self-pay | Admitting: Specialist

## 2021-08-20 DIAGNOSIS — R0600 Dyspnea, unspecified: Secondary | ICD-10-CM

## 2021-08-20 DIAGNOSIS — R0609 Other forms of dyspnea: Secondary | ICD-10-CM

## 2021-08-20 DIAGNOSIS — J449 Chronic obstructive pulmonary disease, unspecified: Secondary | ICD-10-CM

## 2021-08-20 DIAGNOSIS — R918 Other nonspecific abnormal finding of lung field: Secondary | ICD-10-CM

## 2021-09-09 ENCOUNTER — Ambulatory Visit
Admission: RE | Admit: 2021-09-09 | Discharge: 2021-09-09 | Disposition: A | Discharge status: Home / Self Care | Payer: PPO | Source: Ambulatory Visit | Attending: Specialist | Admitting: Specialist

## 2021-09-09 ENCOUNTER — Other Ambulatory Visit: Payer: Self-pay

## 2021-09-09 DIAGNOSIS — J439 Emphysema, unspecified: Secondary | ICD-10-CM | POA: Diagnosis not present

## 2021-09-09 DIAGNOSIS — R0609 Other forms of dyspnea: Secondary | ICD-10-CM

## 2021-09-09 DIAGNOSIS — R911 Solitary pulmonary nodule: Secondary | ICD-10-CM | POA: Diagnosis not present

## 2021-09-09 DIAGNOSIS — R918 Other nonspecific abnormal finding of lung field: Secondary | ICD-10-CM | POA: Insufficient documentation

## 2021-09-09 DIAGNOSIS — J449 Chronic obstructive pulmonary disease, unspecified: Secondary | ICD-10-CM | POA: Insufficient documentation

## 2021-09-09 DIAGNOSIS — R0600 Dyspnea, unspecified: Secondary | ICD-10-CM | POA: Insufficient documentation

## 2021-09-09 DIAGNOSIS — I700 Atherosclerosis of aorta: Secondary | ICD-10-CM | POA: Diagnosis not present

## 2021-10-05 DIAGNOSIS — E119 Type 2 diabetes mellitus without complications: Secondary | ICD-10-CM | POA: Diagnosis not present

## 2021-10-05 DIAGNOSIS — M79671 Pain in right foot: Secondary | ICD-10-CM | POA: Diagnosis not present

## 2021-10-05 DIAGNOSIS — M79672 Pain in left foot: Secondary | ICD-10-CM | POA: Diagnosis not present

## 2021-10-05 DIAGNOSIS — B351 Tinea unguium: Secondary | ICD-10-CM | POA: Diagnosis not present

## 2021-10-14 DIAGNOSIS — Z955 Presence of coronary angioplasty implant and graft: Secondary | ICD-10-CM | POA: Diagnosis not present

## 2021-10-14 DIAGNOSIS — R531 Weakness: Secondary | ICD-10-CM | POA: Diagnosis not present

## 2021-10-14 DIAGNOSIS — I480 Paroxysmal atrial fibrillation: Secondary | ICD-10-CM | POA: Diagnosis not present

## 2021-10-14 DIAGNOSIS — I714 Abdominal aortic aneurysm, without rupture, unspecified: Secondary | ICD-10-CM | POA: Diagnosis not present

## 2021-10-14 DIAGNOSIS — I2510 Atherosclerotic heart disease of native coronary artery without angina pectoris: Secondary | ICD-10-CM | POA: Diagnosis not present

## 2021-10-14 DIAGNOSIS — R0609 Other forms of dyspnea: Secondary | ICD-10-CM | POA: Diagnosis not present

## 2021-10-14 DIAGNOSIS — G4733 Obstructive sleep apnea (adult) (pediatric): Secondary | ICD-10-CM | POA: Diagnosis not present

## 2021-10-14 DIAGNOSIS — C9000 Multiple myeloma not having achieved remission: Secondary | ICD-10-CM | POA: Diagnosis not present

## 2021-10-14 DIAGNOSIS — J449 Chronic obstructive pulmonary disease, unspecified: Secondary | ICD-10-CM | POA: Diagnosis not present

## 2021-10-14 DIAGNOSIS — R001 Bradycardia, unspecified: Secondary | ICD-10-CM | POA: Diagnosis not present

## 2021-10-14 DIAGNOSIS — I10 Essential (primary) hypertension: Secondary | ICD-10-CM | POA: Diagnosis not present

## 2021-11-10 DIAGNOSIS — E785 Hyperlipidemia, unspecified: Secondary | ICD-10-CM | POA: Diagnosis not present

## 2021-11-10 DIAGNOSIS — E1169 Type 2 diabetes mellitus with other specified complication: Secondary | ICD-10-CM | POA: Diagnosis not present

## 2021-11-10 DIAGNOSIS — Z79899 Other long term (current) drug therapy: Secondary | ICD-10-CM | POA: Diagnosis not present

## 2021-11-18 DIAGNOSIS — E1169 Type 2 diabetes mellitus with other specified complication: Secondary | ICD-10-CM | POA: Diagnosis not present

## 2021-11-18 DIAGNOSIS — I480 Paroxysmal atrial fibrillation: Secondary | ICD-10-CM | POA: Diagnosis not present

## 2021-11-18 DIAGNOSIS — J449 Chronic obstructive pulmonary disease, unspecified: Secondary | ICD-10-CM | POA: Diagnosis not present

## 2021-11-18 DIAGNOSIS — E1159 Type 2 diabetes mellitus with other circulatory complications: Secondary | ICD-10-CM | POA: Diagnosis not present

## 2021-11-18 DIAGNOSIS — I152 Hypertension secondary to endocrine disorders: Secondary | ICD-10-CM | POA: Diagnosis not present

## 2021-11-18 DIAGNOSIS — D696 Thrombocytopenia, unspecified: Secondary | ICD-10-CM | POA: Diagnosis not present

## 2021-11-18 DIAGNOSIS — E785 Hyperlipidemia, unspecified: Secondary | ICD-10-CM | POA: Diagnosis not present

## 2021-11-18 DIAGNOSIS — I739 Peripheral vascular disease, unspecified: Secondary | ICD-10-CM | POA: Diagnosis not present

## 2021-11-18 DIAGNOSIS — Z79899 Other long term (current) drug therapy: Secondary | ICD-10-CM | POA: Diagnosis not present

## 2021-11-18 DIAGNOSIS — I700 Atherosclerosis of aorta: Secondary | ICD-10-CM | POA: Diagnosis not present

## 2021-11-18 DIAGNOSIS — Z125 Encounter for screening for malignant neoplasm of prostate: Secondary | ICD-10-CM | POA: Diagnosis not present

## 2021-11-18 DIAGNOSIS — R972 Elevated prostate specific antigen [PSA]: Secondary | ICD-10-CM | POA: Diagnosis not present

## 2021-11-29 DIAGNOSIS — R972 Elevated prostate specific antigen [PSA]: Secondary | ICD-10-CM | POA: Diagnosis not present

## 2021-12-09 ENCOUNTER — Other Ambulatory Visit: Payer: Self-pay

## 2021-12-09 ENCOUNTER — Ambulatory Visit: Payer: PPO | Admitting: Urology

## 2021-12-09 ENCOUNTER — Encounter: Payer: Self-pay | Admitting: Urology

## 2021-12-09 VITALS — BP 182/80 | HR 69 | Ht 71.0 in | Wt 195.0 lb

## 2021-12-09 DIAGNOSIS — R972 Elevated prostate specific antigen [PSA]: Secondary | ICD-10-CM | POA: Diagnosis not present

## 2021-12-09 NOTE — Progress Notes (Signed)
12/09/2021 10:52 AM   Bryan Strong 18-Jun-1946 [id number]  Referring provider: Derinda Late, MD [phone] [address] [facility] [place],  [place] [address]  Chief Complaint  Patient presents with   Elevated PSA    HPI: Bryan Strong is a 75 y.o. male referred for evaluation of an elevated PSA  PSA 11/18/2021 was 7.8 with percent free PSA 10.9 PSA has been slowly rising since 2020.  It was in the mid 3 range and 2017 in 2018 No bothersome LUTS he has mild urinary hesitancy and nocturia x2-3 Prior history of recurrent stone disease not requiring intervention.  Estimates his last stone was ~ 30 years ago Denies dysuria, gross hematuria No flank, abdominal or pelvic pain Prior PSA:     PMH: Past Medical History:  Diagnosis Date   A-fib ([place])    Achalasia of esophagus    Allergic rhinitis    Anemia    Anginal pain (HCC)    Aortic aneurysm (HCC)    followed by Dr. Franchot Gallo   Coronary artery disease    Diabetes mellitus without complication ([place])    Dyspnea    Emphysema of lung (HCC)    GERD (gastroesophageal reflux disease)    Heart murmur    Hypercholesteremia    Hypertension    Kidney stones    Skin cancer    Sleep apnea    CPAP   Wears dentures    full upper and lower    Surgical History: Past Surgical History:  Procedure Laterality Date   CHOLECYSTECTOMY     colon polyps     COLONOSCOPY WITH PROPOFOL N/A 09/18/2017   Procedure: COLONOSCOPY WITH PROPOFOL;  Surgeon: Manya Silvas, MD;  Location: [facility] ENDOSCOPY;  Service: Endoscopy;  Laterality: N/A;   CORONARY ANGIOPLASTY WITH STENT PLACEMENT  01/03/11   [facility]  Dr. Saralyn Pilar   ECTROPION REPAIR Bilateral 05/03/2016   Procedure: REPAIR OF ECTROPION BILATERAL LOWER EYELIDS;  Surgeon: Karle Starch, MD;  Location: [place];  Service: Ophthalmology;  Laterality: Bilateral;   ESOPHAGOGASTRODUODENOSCOPY (EGD) WITH PROPOFOL N/A 07/21/2017   Procedure:  ESOPHAGOGASTRODUODENOSCOPY (EGD) WITH PROPOFOL;  Surgeon: Manya Silvas, MD;  Location: [facility] ENDOSCOPY;  Service: Endoscopy;  Laterality: N/A;   ESOPHAGOGASTRODUODENOSCOPY (EGD) WITH PROPOFOL Bilateral 10/18/2017   Procedure: ESOPHAGOGASTRODUODENOSCOPY (EGD) WITH PROPOFOL;  Surgeon: Manya Silvas, MD;  Location: [facility] ENDOSCOPY;  Service: Endoscopy;  Laterality: Bilateral;   HERNIA REPAIR     LEFT HEART CATH Left 06/08/2017   Procedure: Left Heart Cath and Coronary Angiography;  Surgeon: Yolonda Kida, MD;  Location: [place] CV LAB;  Service: Cardiovascular;  Laterality: Left;   LID LESION EXCISION Bilateral 05/03/2016   Procedure: ,EXCISION CYST RIGHT UPPER EYELID;  Surgeon: Karle Starch, MD;  Location: [place];  Service: Ophthalmology;  Laterality: Bilateral;  Diabetic - oral meds CPAP   NASAL SEPTUM SURGERY  1967   SAVORY DILATION N/A 07/21/2017   Procedure: SAVORY DILATION;  Surgeon: Manya Silvas, MD;  Location: [facility] ENDOSCOPY;  Service: Endoscopy;  Laterality: N/A;    Home Medications:  Allergies as of 12/09/2021       Reactions   Erythromycin Base Rash   Other Rash   ERYTHROMYCIN OINTMENT "some nerve pill, caused HTN"        Medication List        Accurate as of December 09, 2021 10:52 AM. If you have any questions, ask your nurse or doctor.  ascorbic acid 500 MG tablet Commonly known as: VITAMIN C Take 500 mg by mouth daily.   aspirin 325 MG tablet Take 325 mg by mouth daily.   azelastine 0.1 % nasal spray Commonly known as: ASTELIN Place 1 spray into both nostrils 2 (two) times daily. Use in each nostril as directed   azelastine 0.1 % nasal spray Commonly known as: ASTELIN Place into the nose.   calcium carbonate 1250 (500 Ca) MG chewable tablet Commonly known as: OS-CAL Chew 2 tablets by mouth daily as needed.   Cholecalciferol 25 MCG (1000 UT) capsule Take by mouth.   clopidogrel 75 MG tablet Commonly known  as: PLAVIX Take 1 tablet by mouth daily.   fexofenadine 180 MG tablet Commonly known as: ALLEGRA Take 180 mg by mouth daily.   FLAXSEED OIL PO Take 1 tablet by mouth 2 (two) times daily.   fluticasone 50 MCG/ACT nasal spray Commonly known as: FLONASE Place 1 spray into both nostrils 2 (two) times daily.   glipiZIDE 10 MG 24 hr tablet Commonly known as: GLUCOTROL XL Take 1 tablet by mouth daily.   hydrochlorothiazide 25 MG tablet Commonly known as: HYDRODIURIL Take by mouth.   lisinopril 10 MG tablet Commonly known as: ZESTRIL Take 1 tablet by mouth 2 (two) times daily.   metFORMIN 1000 MG tablet Commonly known as: GLUCOPHAGE Take 1,000 mg by mouth 2 (two) times daily with a meal.   metoprolol tartrate 25 MG tablet Commonly known as: LOPRESSOR Take 12.5 mg by mouth 2 (two) times daily.   multivitamin with minerals Tabs tablet Take 1 tablet by mouth daily.   olopatadine 0.1 % ophthalmic solution Commonly known as: PATANOL Place 1 drop into both eyes 2 (two) times daily.   pantoprazole 40 MG tablet Commonly known as: PROTONIX Take 40 mg by mouth daily before breakfast.   ranolazine 500 MG 12 hr tablet Commonly known as: RANEXA Take 500 mg by mouth 2 (two) times daily.   simvastatin 40 MG tablet Commonly known as: ZOCOR Take 1 tablet by mouth at bedtime.   sucralfate 1 g tablet Commonly known as: CARAFATE DISSOLVE ONE TABLET IN ONE OUNCE OF WATER AND TAKE THREE TIMES PER DAY 30 MINUTES BEFORE MEALS   SYSTANE ULTRA OP Apply 2 drops to eye 5 (five) times daily as needed (dry eyes).        Allergies:  Allergies  Allergen Reactions   Erythromycin Base Rash   Other Rash    ERYTHROMYCIN OINTMENT "some nerve pill, caused HTN"    Family History: Family History  Problem Relation Age of Onset   Diabetes Brother    Diabetes Maternal Aunt    Diabetes Maternal Uncle    Diabetes Maternal Grandmother     Social History:  reports that he quit smoking  about 6 years ago. His smoking use included cigarettes. He has a 56.00 pack-year smoking history. He has never used smokeless tobacco. He reports that he does not drink alcohol and does not use drugs.   Physical Exam: BP (!) 182/80    Pulse 69    Ht 5\' 11"  (1.803 m)    Wt 195 lb (88.5 kg)    BMI 27.20 kg/m   Constitutional:  Alert and oriented, No acute distress. HEENT: [place] AT, moist mucus membranes.  Trachea midline, no masses. Cardiovascular: No clubbing, cyanosis, or edema. Respiratory: Normal respiratory effort, no increased work of breathing. GU: Prostate 60 g, smooth without nodules Psychiatric: Normal mood and affect.   Assessment &  Plan:    1.  Elevated PSA Although PSA is a prostate cancer screening test he was informed that cancer is not the most common cause of an elevated PSA. Other potential causes including BPH and inflammation were discussed. He was informed that the only way to adequately diagnose prostate cancer would be a transrectal ultrasound and biopsy of the prostate. The procedure was discussed including potential risks of bleeding and infection/sepsis. He was also informed that a negative biopsy does not conclusively rule out the possibility that prostate cancer may be present and that continued monitoring is required. The use of newer adjunctive blood tests including 4kScore were discussed. The use of multiparametric prostate MRI to assess for lesions suspicious for high-grade prostate cancer and aid in targeted biopsy was reviewed. Continued periodic surveillance was also discussed. He was not sure which way he wanted to go but is leading towards MRI versus surveillance.  Will touch base in 1-2 weeks regarding his thoughts.   Abbie Sons, [place] [address], [place],  [address] [phone]

## 2021-12-13 ENCOUNTER — Encounter: Payer: Self-pay | Admitting: Urology

## 2021-12-14 ENCOUNTER — Emergency Department
Admission: EM | Admit: 2021-12-14 | Discharge: 2021-12-15 | Disposition: A | Discharge status: Home / Self Care | Payer: PPO | Attending: Emergency Medicine | Admitting: Emergency Medicine

## 2021-12-14 ENCOUNTER — Emergency Department: Payer: PPO

## 2021-12-14 ENCOUNTER — Other Ambulatory Visit: Payer: Self-pay

## 2021-12-14 DIAGNOSIS — I2510 Atherosclerotic heart disease of native coronary artery without angina pectoris: Secondary | ICD-10-CM | POA: Diagnosis not present

## 2021-12-14 DIAGNOSIS — R079 Chest pain, unspecified: Secondary | ICD-10-CM | POA: Diagnosis not present

## 2021-12-14 DIAGNOSIS — E119 Type 2 diabetes mellitus without complications: Secondary | ICD-10-CM | POA: Insufficient documentation

## 2021-12-14 DIAGNOSIS — Z7984 Long term (current) use of oral hypoglycemic drugs: Secondary | ICD-10-CM | POA: Diagnosis not present

## 2021-12-14 DIAGNOSIS — Z7982 Long term (current) use of aspirin: Secondary | ICD-10-CM | POA: Diagnosis not present

## 2021-12-14 DIAGNOSIS — Z87891 Personal history of nicotine dependence: Secondary | ICD-10-CM | POA: Diagnosis not present

## 2021-12-14 DIAGNOSIS — J449 Chronic obstructive pulmonary disease, unspecified: Secondary | ICD-10-CM | POA: Diagnosis not present

## 2021-12-14 DIAGNOSIS — Z85828 Personal history of other malignant neoplasm of skin: Secondary | ICD-10-CM | POA: Diagnosis not present

## 2021-12-14 DIAGNOSIS — I3139 Other pericardial effusion (noninflammatory): Secondary | ICD-10-CM | POA: Insufficient documentation

## 2021-12-14 DIAGNOSIS — Z955 Presence of coronary angioplasty implant and graft: Secondary | ICD-10-CM | POA: Diagnosis not present

## 2021-12-14 DIAGNOSIS — R42 Dizziness and giddiness: Secondary | ICD-10-CM | POA: Diagnosis not present

## 2021-12-14 DIAGNOSIS — Z79899 Other long term (current) drug therapy: Secondary | ICD-10-CM | POA: Insufficient documentation

## 2021-12-14 DIAGNOSIS — J811 Chronic pulmonary edema: Secondary | ICD-10-CM | POA: Diagnosis not present

## 2021-12-14 DIAGNOSIS — I10 Essential (primary) hypertension: Secondary | ICD-10-CM | POA: Diagnosis not present

## 2021-12-14 DIAGNOSIS — R55 Syncope and collapse: Secondary | ICD-10-CM | POA: Diagnosis not present

## 2021-12-14 DIAGNOSIS — R911 Solitary pulmonary nodule: Secondary | ICD-10-CM | POA: Diagnosis not present

## 2021-12-14 LAB — BASIC METABOLIC PANEL
Anion gap: 7 (ref 5–15)
BUN: 11 mg/dL (ref 8–23)
CO2: 28 mmol/L (ref 22–32)
Calcium: 9.5 mg/dL (ref 8.9–10.3)
Chloride: 98 mmol/L (ref 98–111)
Creatinine, Ser: 0.93 mg/dL (ref 0.61–1.24)
GFR, Estimated: >60 mL/min (ref >60)
Glucose, Bld: 272 mg/dL — ABNORMAL HIGH (ref 70–99)
Potassium: 4 mmol/L (ref 3.5–5.1)
Sodium: 133 mmol/L — ABNORMAL LOW (ref 135–145)

## 2021-12-14 LAB — CBC
HCT: 41.6 % (ref 39.0–52.0)
Hemoglobin: 14 g/dL (ref 13.0–17.0)
MCH: 30.2 pg (ref 26.0–34.0)
MCHC: 33.7 g/dL (ref 30.0–36.0)
MCV: 89.8 fL (ref 80.0–100.0)
Platelets: 141 10*3/uL — ABNORMAL LOW (ref 150–400)
RBC: 4.63 MIL/uL (ref 4.22–5.81)
RDW: 13.6 % (ref 11.5–15.5)
WBC: 5.4 10*3/uL (ref 4.0–10.5)
nRBC: 0 % (ref 0.0–0.2)

## 2021-12-14 LAB — TROPONIN I (HIGH SENSITIVITY)
Troponin I (High Sensitivity): 7 ng/L (ref <18)
Troponin I (High Sensitivity): 7 ng/L (ref <18)

## 2021-12-14 NOTE — ED Triage Notes (Signed)
Pt states sent from [facility] for HTN and near syncope on christmas day. Pt ambulatory to triage, alert and oriented.  States head was feeling swimmy, but did not completely have +LOC Cardiologist decreased metoprolol d/t low HR and hypotention and dizziness.

## 2021-12-14 NOTE — ED Provider Notes (Signed)
[facility] Emergency Department Provider Note  ____________________________________________  Time seen: Approximately 11:59 PM  I have reviewed the triage vital signs and the nursing notes.   HISTORY  Chief Complaint Hypertension and Near Syncope   HPI Bryan Strong is a 75 y.o. male with a history of CAD on aspirin and Plavix, A. fib, diabetes, emphysema, hypertension, hyperlipidemia, OSA on CPAP who presents for evaluation of near syncopal episode.  Patient reports that he has been having episodes of lightheadedness for the last several months.  Those episodes usually happen when he is walking.  He reports that he usually stops for a few seconds and then go away.  He denies ever fully syncopized.  On Christmas eve he had walked to the kitchen and had another episode.  This one brought him down to his knees but he again said that he never fully lost consciousness.  He felt this episode was much more severe than the others.  Since was the Christmas weekend he did not seek care and went to urgent care this morning and then sent over here for evaluation.  His cardiologist has decreased his dose of metoprolol in October because of these dizzy spells and the fact the patient was found to be hypotensive.  Patient does report some improvement on the frequency of these episodes but they are still persistent.  He usually has them once a week.  Patient denies having shortness of breath, chest pain, or headache associated with these episodes.  He denies facial droop, slurred speech, diplopia, dysarthria, dysphagia, unilateral weakness or numbness, vertigo.  Past Medical History:  Diagnosis Date   A-fib ([place])    Achalasia of esophagus    Allergic rhinitis    Anemia    Anginal pain ([place])    Aortic aneurysm ([facility])    followed by Dr. Franchot Gallo   Coronary artery disease    Diabetes mellitus without complication ([place])    Dyspnea    Emphysema of lung ([place])    GERD  (gastroesophageal reflux disease)    Heart murmur    Hypercholesteremia    Hypertension    Kidney stones    Skin cancer    Sleep apnea    CPAP   Wears dentures    full upper and lower    Patient Active Problem List   Diagnosis Date Noted   Counseling, unspecified 07/15/2021   Multiple myeloma ([place]) 07/15/2021   Colon polyps 06/29/2020   FH: cholecystectomy 06/29/2020   Hematuria 06/29/2020   Ankle edema 01/08/2020   Paroxysmal atrial fibrillation ([place]) 01/08/2020   PVD (peripheral vascular disease) ([place]) 01/08/2020   S/P right coronary artery (RCA) stent placement 01/08/2020   Aortic atherosclerosis ([place]) 03/05/2019   Achalasia of esophagus 06/26/2017   Anemia 06/26/2017   Other dysphagia 06/26/2017   AAA (abdominal aortic aneurysm) without rupture 02/27/2017   Essential hypertension 02/27/2017   Diabetes ([place]) 02/27/2017   Pure hypercholesterolemia 02/27/2017   CAD (coronary artery disease) 05/27/2016   COPD, moderate ([place]) 05/27/2016   GERD without esophagitis 10/21/2014   Type 2 diabetes mellitus without complications ([place]) 98/33/8250   Sleep apnea, obstructive 10/20/2014    Past Surgical History:  Procedure Laterality Date   CHOLECYSTECTOMY     colon polyps     COLONOSCOPY WITH PROPOFOL N/A 09/18/2017   Procedure: COLONOSCOPY WITH PROPOFOL;  Surgeon: Manya Silvas, MD;  Location: [facility] ENDOSCOPY;  Service: Endoscopy;  Laterality: N/A;   CORONARY ANGIOPLASTY WITH STENT PLACEMENT  01/03/11  [facility]  Dr. Saralyn Pilar   ECTROPION REPAIR Bilateral 05/03/2016   Procedure: REPAIR OF ECTROPION BILATERAL LOWER EYELIDS;  Surgeon: Karle Starch, MD;  Location: [place];  Service: Ophthalmology;  Laterality: Bilateral;   ESOPHAGOGASTRODUODENOSCOPY (EGD) WITH PROPOFOL N/A 07/21/2017   Procedure: ESOPHAGOGASTRODUODENOSCOPY (EGD) WITH PROPOFOL;  Surgeon: Manya Silvas, MD;  Location: [facility] ENDOSCOPY;  Service: Endoscopy;  Laterality: N/A;   ESOPHAGOGASTRODUODENOSCOPY  (EGD) WITH PROPOFOL Bilateral 10/18/2017   Procedure: ESOPHAGOGASTRODUODENOSCOPY (EGD) WITH PROPOFOL;  Surgeon: Manya Silvas, MD;  Location: [facility] ENDOSCOPY;  Service: Endoscopy;  Laterality: Bilateral;   HERNIA REPAIR     LEFT HEART CATH Left 06/08/2017   Procedure: Left Heart Cath and Coronary Angiography;  Surgeon: Yolonda Kida, MD;  Location: [place] CV LAB;  Service: Cardiovascular;  Laterality: Left;   LID LESION EXCISION Bilateral 05/03/2016   Procedure: ,EXCISION CYST RIGHT UPPER EYELID;  Surgeon: Karle Starch, MD;  Location: [place];  Service: Ophthalmology;  Laterality: Bilateral;  Diabetic - oral meds CPAP   NASAL SEPTUM SURGERY  1967   SAVORY DILATION N/A 07/21/2017   Procedure: SAVORY DILATION;  Surgeon: Manya Silvas, MD;  Location: [facility] ENDOSCOPY;  Service: Endoscopy;  Laterality: N/A;    Prior to Admission medications   Medication Sig Start Date End Date Taking? Authorizing Provider  bacitracin 500 UNIT/GM ointment Apply 1 application topically 2 (two) times daily. Hand skin tear 12/15/21  Yes Alfred Levins, [place], MD  ascorbic acid (VITAMIN C) 500 MG tablet Take 500 mg by mouth daily.    [provider]  aspirin 325 MG tablet Take 325 mg by mouth daily.    [provider]  azelastine (ASTELIN) 0.1 % nasal spray Place 1 spray into both nostrils 2 (two) times daily. Use in each nostril as directed     [provider]  azelastine (ASTELIN) 0.1 % nasal spray Place into the nose. 08/19/19   [provider]  calcium carbonate (OS-CAL) 1250 (500 Ca) MG chewable tablet Chew 2 tablets by mouth daily as needed.    [provider]  Cholecalciferol 25 MCG (1000 UT) capsule Take by mouth.    [provider]  clopidogrel (PLAVIX) 75 MG tablet Take 1 tablet by mouth daily. 02/10/20   [provider]  fexofenadine (ALLEGRA) 180 MG tablet Take 180 mg by mouth daily.    [provider]  Flaxseed,  Linseed, (FLAXSEED OIL PO) Take 1 tablet by mouth 2 (two) times daily.     [provider]  fluticasone (FLONASE) 50 MCG/ACT nasal spray Place 1 spray into both nostrils 2 (two) times daily.     [provider]  glipiZIDE (GLUCOTROL XL) 10 MG 24 hr tablet Take 1 tablet by mouth daily. 11/13/19   [provider]  hydrochlorothiazide (HYDRODIURIL) 25 MG tablet Take by mouth. 06/23/20 06/23/21  [provider]  lisinopril (ZESTRIL) 10 MG tablet Take 1 tablet by mouth 2 (two) times daily. 02/26/20   [provider]  metFORMIN (GLUCOPHAGE) 1000 MG tablet Take 1,000 mg by mouth 2 (two) times daily with a meal.    [provider]  metoprolol tartrate (LOPRESSOR) 25 MG tablet Take 12.5 mg by mouth 2 (two) times daily.    [provider]  Multiple Vitamin (MULTIVITAMIN WITH MINERALS) TABS tablet Take 1 tablet by mouth daily.    [provider]  olopatadine (PATANOL) 0.1 % ophthalmic solution Place 1 drop into both eyes 2 (two) times daily.  [provider]  pantoprazole (PROTONIX) 40 MG tablet Take 40 mg by mouth daily before breakfast.     [provider]  Polyethyl Glycol-Propyl Glycol (SYSTANE ULTRA OP) Apply 2 drops to eye 5 (five) times daily as needed (dry eyes).    [provider]  ranolazine (RANEXA) 500 MG 12 hr tablet Take 500 mg by mouth 2 (two) times daily.    [provider]  simvastatin (ZOCOR) 40 MG tablet Take 1 tablet by mouth at bedtime. 09/25/19   [provider]  sucralfate (CARAFATE) 1 g tablet DISSOLVE ONE TABLET IN ONE OUNCE OF WATER AND TAKE THREE TIMES PER DAY 30 MINUTES BEFORE MEALS 11/14/17   [provider]    Allergies Erythromycin base and Other  Family History  Problem Relation Age of Onset   Diabetes Brother    Diabetes Maternal Aunt    Diabetes Maternal Uncle    Diabetes Maternal Grandmother     Social History Social History   Tobacco Use    Smoking status: Former    Packs/day: 1.00    Years: 56.00    Pack years: 56.00    Types: Cigarettes    Quit date: 11/04/2015    Years since quitting: 6.1   Smokeless tobacco: Never  Vaping Use   Vaping Use: Never used  Substance Use Topics   Alcohol use: No   Drug use: No    Review of Systems  Constitutional: Negative for fever. + Lightheadedness Eyes: Negative for visual changes. ENT: Negative for sore throat. Neck: No neck pain  Cardiovascular: Negative for chest pain. Respiratory: Negative for shortness of breath. Gastrointestinal: Negative for abdominal pain, vomiting or diarrhea. Genitourinary: Negative for dysuria. Musculoskeletal: Negative for back pain. Skin: Negative for rash. Neurological: Negative for headaches, weakness or numbness. Psych: No SI or HI  ____________________________________________   PHYSICAL EXAM:  VITAL SIGNS: ED Triage Vitals  Enc Vitals Group     BP 12/14/21 1043 (!) 197/111     Pulse Rate 12/14/21 1043 75     Resp 12/14/21 1043 20     Temp 12/14/21 1043 98 F (36.7 C)     Temp Source 12/14/21 1043 Oral     SpO2 12/14/21 1043 96 %     Weight 12/14/21 1044 196 lb 3.4 oz (89 kg)     Height 12/14/21 1044 '5\' 10"'  (1.778 m)     Head Circumference --      Peak Flow --      Pain Score 12/14/21 1044 0     Pain Loc --      Pain Edu? --      Excl. in [place]? --     Constitutional: Alert and oriented. Well appearing and in no apparent distress. HEENT:      Head: Normocephalic and atraumatic.         Eyes: Conjunctivae are normal. Sclera is non-icteric.       Mouth/Throat: Mucous membranes are moist.       Neck: Supple with no signs of meningismus. Cardiovascular: Regular rate and rhythm. No murmurs, gallops, or rubs. 2+ symmetrical distal pulses are present in all extremities. No JVD. Respiratory: Normal respiratory effort. Lungs are clear to auscultation bilaterally.  Gastrointestinal: Soft, non tender, and non distended with positive  bowel sounds. No rebound or guarding. Genitourinary: No CVA tenderness. Musculoskeletal:  No edema, cyanosis, or erythema of extremities. Neurologic: Normal speech and language. Face is symmetric. EOMI, PERRL, normal strength and sensation x4, no pronator drift,  no dysmetria Skin: Skin is warm, dry and intact. No rash noted. Psychiatric: Mood and affect are normal. Speech and behavior are normal.  ____________________________________________   LABS (all labs ordered are listed, but only abnormal results are displayed)  Labs Reviewed  BASIC METABOLIC PANEL - Abnormal; Notable for the following components:      Result Value   Sodium 133 (*)    Glucose, Bld 272 (*)    All other components within normal limits  CBC - Abnormal; Notable for the following components:   Platelets 141 (*)    All other components within normal limits  TROPONIN I (HIGH SENSITIVITY)  TROPONIN I (HIGH SENSITIVITY)   ____________________________________________  EKG  ED ECG REPORT I, Rudene Re, the attending physician, personally viewed and interpreted this ECG.  Normal sinus rhythm, normal intervals, normal axis, no STE or depressions, no evidence of HOCM, AV block, delta wave, ARVD, prolonged QTc, WPW, or [place].  Unchanged from prior   ____________________________________________  RADIOLOGY  I have personally reviewed the images performed during this visit and I agree with the Radiologist's read.   Interpretation by Radiologist:  DG Chest 2 View  Result Date: 12/14/2021 CLINICAL DATA:  Chest pain.  Dizziness.  Hypertension. EXAM: CHEST - 2 VIEW COMPARISON:  CT chest 09/09/2021 FINDINGS: The heart size and mediastinal contours are within normal limits. Pulmonary vascular congestion. No pleural effusion or edema. No airspace consolidation. New nodular density within the left lower lung adjacent to the left heart border measures 1.1 cm. The visualized skeletal structures are unremarkable.  IMPRESSION: 1. Pulmonary vascular congestion. 2. New nodular density within the left lower lung measures 1.1 cm. Consider further evaluation with CT of the chest. Electronically Signed   By: Kerby Moors M.D.   On: 12/14/2021 11:17   CT HEAD WO CONTRAST (5MM)  Result Date: 12/15/2021 CLINICAL DATA:  Syncope. EXAM: CT HEAD WITHOUT CONTRAST TECHNIQUE: Contiguous axial images were obtained from the base of the skull through the vertex without intravenous contrast. COMPARISON:  May 14, 2018 FINDINGS: Brain: There is mild cerebral atrophy with widening of the extra-axial spaces and ventricular dilatation. There are areas of decreased attenuation within the white matter tracts of the supratentorial brain, consistent with microvascular disease changes. A small, chronic, bilateral basal ganglia lacunar infarcts are seen. Vascular: No hyperdense vessel or unexpected calcification. Skull: Normal. Negative for fracture or focal lesion. Sinuses/Orbits: No acute finding. Other: None. IMPRESSION: 1. Generalized cerebral atrophy. 2. No acute intracranial abnormality. Electronically Signed   By: Virgina Norfolk M.D.   On: 12/15/2021 01:27   CT Angio Chest PE W and/or Wo Contrast  Result Date: 12/15/2021 CLINICAL DATA:  Syncope. EXAM: CT ANGIOGRAPHY CHEST WITH CONTRAST TECHNIQUE: Multidetector CT imaging of the chest was performed using the standard protocol during bolus administration of intravenous contrast. Multiplanar CT image reconstructions and MIPs were obtained to evaluate the vascular anatomy. CONTRAST:  36m OMNIPAQUE IOHEXOL 350 MG/ML SOLN COMPARISON:  September 09, 2021 and September 19, 2017 FINDINGS: Cardiovascular: There is marked severity calcification of the thoracic aorta, without evidence of aortic aneurysm or dissection. Satisfactory opacification of the pulmonary arteries to the segmental level. No evidence of pulmonary embolism. Normal heart size with mild to moderate severity coronary artery  calcification. A small, approximately 6 mm thick pericardial effusion is noted. This is increased in severity when compared to the prior study. Mediastinum/Nodes: No enlarged mediastinal, hilar, or axillary lymph nodes. The thyroid gland and trachea demonstrate no significant findings. Marked severity diffuse  esophageal wall thickening is seen. This is predominantly stable in severity when compared to the prior study and extends along the entire length of the esophagus. Lungs/Pleura: Mild, stable posterior biapical and anterior left lower lobe scarring and/or atelectasis is seen. Mild to moderate severity emphysematous lung disease is noted. A stable 2 mm pleural based noncalcified lung nodule is seen within the posterolateral aspect of the right lower lobe (axial CT image 66, CT series 6). There is no evidence of acute infiltrate, pleural effusion or pneumothorax. Upper Abdomen: A small amount of perihepatic fluid is noted. Musculoskeletal: Multilevel degenerative changes are seen throughout the thoracic spine Review of the MIP images confirms the above findings. IMPRESSION: 1. No evidence of pulmonary embolism. 2. Small, pericardial effusion, increased in severity when compared to the prior study. 3. Mild to moderate severity emphysematous lung disease. 4. Stable, benign 2 mm right lower lobe noncalcified lung nodule. 5. Chronic marked severity diffuse esophageal wall thickening. 6. Small amount of perihepatic fluid. Aortic Atherosclerosis (ICD10-I70.0) and Emphysema (ICD10-J43.9). Electronically Signed   By: Virgina Norfolk M.D.   On: 12/15/2021 01:35     ____________________________________________   PROCEDURES  Procedure(s) performed:yes .1-3 Lead EKG Interpretation Performed by: Rudene Re, MD Authorized by: Rudene Re, MD     Interpretation: non-specific     ECG rate assessment: normal     Rhythm: sinus rhythm     Ectopy: none     Conduction: normal     Critical Care  performed:  None ____________________________________________   INITIAL IMPRESSION / ASSESSMENT AND PLAN / ED COURSE   75 y.o. male with a history of CAD on aspirin and Plavix, A. fib, diabetes, emphysema, hypertension, hyperlipidemia, OSA on CPAP who presents for evaluation of near syncopal episode that happened 4 days ago.  Patient is hemodynamically stable with normal neurological exam.  Patient is hypertensive with systolics between 383A and 180s.  Patient denies any prior history of hypertension.  Has missed his evening dose of lisinopril.  Differential diagnosis is broad including cardiac dysrhythmia, vasovagal episodes, PE, dehydration, anemia, malignancy, stroke, infection  EKG with no signs of dysrhythmias or ischemia.  2 high-sensitivity troponins are normal.  No significant electrolyte derangements, no signs of dehydration, mildly elevated blood glucose with no signs of DKA.  No signs of sepsis, no signs of anemia.  Chest x-ray is concerning for a new nodular density in the left lower lung therefore we will get a CT.  We will get the CT angio to rule out PE as well.  We will get a head CT to rule out any intracranial mass or bleed.  We will monitor patient on telemetry for any signs of dysrhythmia.  I did review patient's last visit with his cardiologist from October 2022 where the metoprolol was reduced for bradycardia and hypotension.  _________________________ 2:37 AM on 12/15/2021 ----------------------------------------- Head CT is negative.  CT of the chest shows no PE and no mass but it does show a small pericardial effusion which is increased in size when compared to prior study from September had discussed this finding with patient and recommended admission for an echocardiogram and possible stress test since his symptoms happen with ambulation.  Patient reports that his wife is handicapped and is home alone and he is her only caretaker.  He understands the risks of going home  without further evaluation of the symptoms including cardiac dysrhythmia and death.  He will follow-up with his cardiologist to have this evaluation done as an outpatient.  He will return if his symptoms worsen.  I did send a secure chat to patient's cardiologist to ensure appropriate and fast follow-up and management.      _____________________________________________ Please note:  Patient was evaluated in Emergency Department today for the symptoms described in the history of present illness. Patient was evaluated in the context of the global COVID-19 pandemic, which necessitated consideration that the patient might be at risk for infection with the SARS-CoV-2 virus that causes COVID-19. Institutional protocols and algorithms that pertain to the evaluation of patients at risk for COVID-19 are in a state of rapid change based on information released by regulatory bodies including the CDC and federal and state organizations. These policies and algorithms were followed during the patient's care in the ED.  Some ED evaluations and interventions may be delayed as a result of limited staffing during the pandemic.   [place] Controlled Substance Database was reviewed by me. ____________________________________________   FINAL CLINICAL IMPRESSION(S) / ED DIAGNOSES   Final diagnoses:  Near syncope  Pericardial effusion      NEW MEDICATIONS STARTED DURING THIS VISIT:  ED Discharge Orders          Ordered    bacitracin 500 UNIT/GM ointment  2 times daily        12/15/21 3016             Note:  This document was prepared using Dragon voice recognition software and may include unintentional dictation errors.    Rudene Re, MD 12/15/21 [phone]

## 2021-12-15 ENCOUNTER — Emergency Department: Payer: PPO

## 2021-12-15 DIAGNOSIS — R55 Syncope and collapse: Secondary | ICD-10-CM | POA: Diagnosis not present

## 2021-12-15 DIAGNOSIS — R911 Solitary pulmonary nodule: Secondary | ICD-10-CM | POA: Diagnosis not present

## 2021-12-15 MED ORDER — BACITRACIN 500 UNIT/GM EX OINT: 1.0000 "application " | TOPICAL_OINTMENT | Freq: Two times a day (BID) | CUTANEOUS | 0 refills | Status: DC

## 2021-12-15 MED ORDER — BACITRACIN ZINC 500 UNIT/GM EX OINT
TOPICAL_OINTMENT | Freq: Two times a day (BID) | CUTANEOUS | Status: DC
  Filled 2021-12-15: qty 0.9

## 2021-12-15 MED ORDER — IOHEXOL 350 MG/ML SOLN
75.0000 mL | Freq: Once | INTRAVENOUS | Status: AC | PRN
  Administered 2021-12-15: 01:00:00 75 mL via INTRAVENOUS

## 2021-12-15 MED ORDER — LISINOPRIL 10 MG PO TABS
10.0000 mg | ORAL_TABLET | Freq: Once | ORAL | Status: DC
  Filled 2021-12-15: qty 1

## 2021-12-16 DIAGNOSIS — I480 Paroxysmal atrial fibrillation: Secondary | ICD-10-CM | POA: Diagnosis not present

## 2021-12-16 DIAGNOSIS — E119 Type 2 diabetes mellitus without complications: Secondary | ICD-10-CM | POA: Diagnosis not present

## 2021-12-16 DIAGNOSIS — R42 Dizziness and giddiness: Secondary | ICD-10-CM | POA: Diagnosis not present

## 2021-12-16 DIAGNOSIS — I10 Essential (primary) hypertension: Secondary | ICD-10-CM | POA: Diagnosis not present

## 2021-12-16 DIAGNOSIS — E782 Mixed hyperlipidemia: Secondary | ICD-10-CM | POA: Diagnosis not present

## 2021-12-16 DIAGNOSIS — I208 Other forms of angina pectoris: Secondary | ICD-10-CM | POA: Diagnosis not present

## 2021-12-16 DIAGNOSIS — R55 Syncope and collapse: Secondary | ICD-10-CM | POA: Diagnosis not present

## 2021-12-16 DIAGNOSIS — Z955 Presence of coronary angioplasty implant and graft: Secondary | ICD-10-CM | POA: Diagnosis not present

## 2021-12-16 DIAGNOSIS — I3139 Other pericardial effusion (noninflammatory): Secondary | ICD-10-CM | POA: Diagnosis not present

## 2021-12-16 DIAGNOSIS — I2510 Atherosclerotic heart disease of native coronary artery without angina pectoris: Secondary | ICD-10-CM | POA: Diagnosis not present

## 2021-12-16 DIAGNOSIS — R0609 Other forms of dyspnea: Secondary | ICD-10-CM | POA: Diagnosis not present

## 2021-12-16 DIAGNOSIS — J439 Emphysema, unspecified: Secondary | ICD-10-CM | POA: Diagnosis not present

## 2021-12-23 DIAGNOSIS — R42 Dizziness and giddiness: Secondary | ICD-10-CM | POA: Diagnosis not present

## 2021-12-23 DIAGNOSIS — R0609 Other forms of dyspnea: Secondary | ICD-10-CM | POA: Diagnosis not present

## 2021-12-23 DIAGNOSIS — R55 Syncope and collapse: Secondary | ICD-10-CM | POA: Diagnosis not present

## 2021-12-23 DIAGNOSIS — I208 Other forms of angina pectoris: Secondary | ICD-10-CM | POA: Diagnosis not present

## 2021-12-31 DIAGNOSIS — R42 Dizziness and giddiness: Secondary | ICD-10-CM | POA: Diagnosis not present

## 2021-12-31 DIAGNOSIS — R55 Syncope and collapse: Secondary | ICD-10-CM | POA: Diagnosis not present

## 2021-12-31 DIAGNOSIS — E119 Type 2 diabetes mellitus without complications: Secondary | ICD-10-CM | POA: Diagnosis not present

## 2021-12-31 DIAGNOSIS — I10 Essential (primary) hypertension: Secondary | ICD-10-CM | POA: Diagnosis not present

## 2022-01-06 DIAGNOSIS — H524 Presbyopia: Secondary | ICD-10-CM | POA: Diagnosis not present

## 2022-01-06 DIAGNOSIS — E119 Type 2 diabetes mellitus without complications: Secondary | ICD-10-CM | POA: Diagnosis not present

## 2022-01-06 DIAGNOSIS — Z7984 Long term (current) use of oral hypoglycemic drugs: Secondary | ICD-10-CM | POA: Diagnosis not present

## 2022-01-06 DIAGNOSIS — H2513 Age-related nuclear cataract, bilateral: Secondary | ICD-10-CM | POA: Diagnosis not present

## 2022-01-06 DIAGNOSIS — H5213 Myopia, bilateral: Secondary | ICD-10-CM | POA: Diagnosis not present

## 2022-01-06 DIAGNOSIS — H52223 Regular astigmatism, bilateral: Secondary | ICD-10-CM | POA: Diagnosis not present

## 2022-01-11 DIAGNOSIS — M79672 Pain in left foot: Secondary | ICD-10-CM | POA: Diagnosis not present

## 2022-01-11 DIAGNOSIS — M79671 Pain in right foot: Secondary | ICD-10-CM | POA: Diagnosis not present

## 2022-01-11 DIAGNOSIS — B351 Tinea unguium: Secondary | ICD-10-CM | POA: Diagnosis not present

## 2022-01-11 DIAGNOSIS — E119 Type 2 diabetes mellitus without complications: Secondary | ICD-10-CM | POA: Diagnosis not present

## 2022-01-14 DIAGNOSIS — I714 Abdominal aortic aneurysm, without rupture, unspecified: Secondary | ICD-10-CM | POA: Diagnosis not present

## 2022-01-14 DIAGNOSIS — Z955 Presence of coronary angioplasty implant and graft: Secondary | ICD-10-CM | POA: Diagnosis not present

## 2022-01-14 DIAGNOSIS — I10 Essential (primary) hypertension: Secondary | ICD-10-CM | POA: Diagnosis not present

## 2022-01-14 DIAGNOSIS — J439 Emphysema, unspecified: Secondary | ICD-10-CM | POA: Diagnosis not present

## 2022-01-14 DIAGNOSIS — R42 Dizziness and giddiness: Secondary | ICD-10-CM | POA: Diagnosis not present

## 2022-01-14 DIAGNOSIS — E119 Type 2 diabetes mellitus without complications: Secondary | ICD-10-CM | POA: Diagnosis not present

## 2022-01-14 DIAGNOSIS — G4733 Obstructive sleep apnea (adult) (pediatric): Secondary | ICD-10-CM | POA: Diagnosis not present

## 2022-01-14 DIAGNOSIS — R55 Syncope and collapse: Secondary | ICD-10-CM | POA: Diagnosis not present

## 2022-01-14 DIAGNOSIS — I480 Paroxysmal atrial fibrillation: Secondary | ICD-10-CM | POA: Diagnosis not present

## 2022-01-14 DIAGNOSIS — I2510 Atherosclerotic heart disease of native coronary artery without angina pectoris: Secondary | ICD-10-CM | POA: Diagnosis not present

## 2022-01-14 DIAGNOSIS — E782 Mixed hyperlipidemia: Secondary | ICD-10-CM | POA: Diagnosis not present

## 2022-01-18 DIAGNOSIS — I10 Essential (primary) hypertension: Secondary | ICD-10-CM | POA: Diagnosis not present

## 2022-01-20 DIAGNOSIS — R55 Syncope and collapse: Secondary | ICD-10-CM | POA: Diagnosis not present

## 2022-01-20 DIAGNOSIS — R42 Dizziness and giddiness: Secondary | ICD-10-CM | POA: Diagnosis not present

## 2022-02-21 DIAGNOSIS — I480 Paroxysmal atrial fibrillation: Secondary | ICD-10-CM | POA: Diagnosis not present

## 2022-02-21 DIAGNOSIS — I471 Supraventricular tachycardia: Secondary | ICD-10-CM | POA: Diagnosis not present

## 2022-02-21 DIAGNOSIS — I208 Other forms of angina pectoris: Secondary | ICD-10-CM | POA: Diagnosis not present

## 2022-02-24 DIAGNOSIS — R42 Dizziness and giddiness: Secondary | ICD-10-CM | POA: Diagnosis not present

## 2022-03-07 DIAGNOSIS — I480 Paroxysmal atrial fibrillation: Secondary | ICD-10-CM | POA: Diagnosis not present

## 2022-03-07 DIAGNOSIS — I2510 Atherosclerotic heart disease of native coronary artery without angina pectoris: Secondary | ICD-10-CM | POA: Diagnosis not present

## 2022-03-07 DIAGNOSIS — I714 Abdominal aortic aneurysm, without rupture, unspecified: Secondary | ICD-10-CM | POA: Diagnosis not present

## 2022-03-07 DIAGNOSIS — C9000 Multiple myeloma not having achieved remission: Secondary | ICD-10-CM | POA: Diagnosis not present

## 2022-03-07 DIAGNOSIS — I10 Essential (primary) hypertension: Secondary | ICD-10-CM | POA: Diagnosis not present

## 2022-03-07 DIAGNOSIS — I208 Other forms of angina pectoris: Secondary | ICD-10-CM | POA: Diagnosis not present

## 2022-03-07 DIAGNOSIS — E119 Type 2 diabetes mellitus without complications: Secondary | ICD-10-CM | POA: Diagnosis not present

## 2022-03-07 DIAGNOSIS — R55 Syncope and collapse: Secondary | ICD-10-CM | POA: Diagnosis not present

## 2022-03-07 DIAGNOSIS — J439 Emphysema, unspecified: Secondary | ICD-10-CM | POA: Diagnosis not present

## 2022-03-07 DIAGNOSIS — R42 Dizziness and giddiness: Secondary | ICD-10-CM | POA: Diagnosis not present

## 2022-03-07 DIAGNOSIS — E782 Mixed hyperlipidemia: Secondary | ICD-10-CM | POA: Diagnosis not present

## 2022-03-07 DIAGNOSIS — I471 Supraventricular tachycardia: Secondary | ICD-10-CM | POA: Diagnosis not present

## 2022-03-16 DIAGNOSIS — R42 Dizziness and giddiness: Secondary | ICD-10-CM | POA: Diagnosis not present

## 2022-03-16 DIAGNOSIS — H903 Sensorineural hearing loss, bilateral: Secondary | ICD-10-CM | POA: Diagnosis not present

## 2022-03-21 DIAGNOSIS — J449 Chronic obstructive pulmonary disease, unspecified: Secondary | ICD-10-CM | POA: Diagnosis not present

## 2022-03-21 DIAGNOSIS — R0609 Other forms of dyspnea: Secondary | ICD-10-CM | POA: Diagnosis not present

## 2022-03-21 DIAGNOSIS — G4733 Obstructive sleep apnea (adult) (pediatric): Secondary | ICD-10-CM | POA: Diagnosis not present

## 2022-04-11 ENCOUNTER — Other Ambulatory Visit: Payer: PPO

## 2022-04-11 DIAGNOSIS — R972 Elevated prostate specific antigen [PSA]: Secondary | ICD-10-CM

## 2022-04-12 ENCOUNTER — Other Ambulatory Visit: Payer: Self-pay | Admitting: Urology

## 2022-04-12 ENCOUNTER — Telehealth: Payer: Self-pay | Admitting: Family Medicine

## 2022-04-12 ENCOUNTER — Encounter: Payer: Self-pay | Admitting: Family Medicine

## 2022-04-12 DIAGNOSIS — M79672 Pain in left foot: Secondary | ICD-10-CM | POA: Diagnosis not present

## 2022-04-12 DIAGNOSIS — M79671 Pain in right foot: Secondary | ICD-10-CM | POA: Diagnosis not present

## 2022-04-12 DIAGNOSIS — B351 Tinea unguium: Secondary | ICD-10-CM | POA: Diagnosis not present

## 2022-04-12 DIAGNOSIS — R972 Elevated prostate specific antigen [PSA]: Secondary | ICD-10-CM

## 2022-04-12 LAB — PSA: Prostate Specific Ag, Serum: 8.8 ng/mL — ABNORMAL HIGH (ref 0.0–4.0)

## 2022-04-12 NOTE — Telephone Encounter (Signed)
Patient notified and voiced understanding. A Mychart message with instruction for the Prostate MRI was sent.  ?

## 2022-04-12 NOTE — Telephone Encounter (Signed)
-----   Message from Nori Riis, PA-C sent at 04/12/2022  8:06 AM EDT ----- ?Please let Mr. Granade know that his PSA is now up to 8.8 and at this time we recommend having a prostate MRI.  Orders are in. ?

## 2022-04-26 NOTE — Progress Notes (Signed)
?Electrophysiology Office Note:   ? ?Date:  04/27/2022  ? ?ID:  Bryan Strong, DOB [birth year], MRN [id number] ? ?PCP:  Derinda Late, MD  ?[facility] Cardiologist:  None  ?[facility] Electrophysiologist:  Vickie Epley, MD  ? ?Referring MD: Derinda Late, MD  ? ?Chief Complaint: Syncope ? ?History of Present Illness:   ? ?Bryan Strong is a 76 y.o. male who presents for an evaluation of AF at the request of Dr Clayborn Bigness. Their medical history includes paroxysmal AF, HTN, CAD s/p PCI, COPD, DM, GERD, HLD, AAA, PVD and OSA. He last saw Dr Clayborn Bigness 02/21/2022. ?He is not currently anticoagulated based on Dr Etta Quill last note. ? ?He is being referred after he experienced a syncopal episode.  ? ?His diagnosis of atrial fibrillation dates back years ago when he had a PCI.  He has not had a recurrence.  He is not on anticoagulation. ? ?Today he tells me he has had spells of lightheadedness and dizziness that dates back to last summer.  These would only occur when standing or walking.  They would last for just 1 to 3 seconds at a time and would not result in loss of consciousness.  On Christmas Eve of 2022 he was standing at the sink working in the kitchen.  His wife was next to him sitting on her rollator.  He abruptly felt dizzy for 1 to 2 seconds before he fell forward.  He is unsure whether he actually lost consciousness.  He struck his wife's walker with his hand which had a skin tear.  He said he felt immediately back to his normal self.  He did not feel palpitations around the time of this event.  He is worn multiple heart monitors which were inconclusive. ? ?He is very active and works outside frequently. ? ?  ?Past Medical History:  ?Diagnosis Date  ? A-fib ([place])   ? Achalasia of esophagus   ? Allergic rhinitis   ? Anemia   ? Anginal pain ([place])   ? Aortic aneurysm ([place])   ? followed by Dr. Franchot Gallo  ? Coronary artery disease   ? Diabetes mellitus without complication ([place])   ? Dyspnea   ? Emphysema of  lung ([place])   ? GERD (gastroesophageal reflux disease)   ? Heart murmur   ? Hypercholesteremia   ? Hypertension   ? Kidney stones   ? Skin cancer   ? Sleep apnea   ? CPAP  ? Wears dentures   ? full upper and lower  ? ? ?Past Surgical History:  ?Procedure Laterality Date  ? CHOLECYSTECTOMY    ? colon polyps    ? COLONOSCOPY WITH PROPOFOL N/A 09/18/2017  ? Procedure: COLONOSCOPY WITH PROPOFOL;  Surgeon: Manya Silvas, MD;  Location: [facility] ENDOSCOPY;  Service: Endoscopy;  Laterality: N/A;  ? CORONARY ANGIOPLASTY WITH STENT PLACEMENT  01/03/11  ? [facility]  Dr. Saralyn Pilar  ? ECTROPION REPAIR Bilateral 05/03/2016  ? Procedure: REPAIR OF ECTROPION BILATERAL LOWER EYELIDS;  Surgeon: Karle Starch, MD;  Location: [place];  Service: Ophthalmology;  Laterality: Bilateral;  ? ESOPHAGOGASTRODUODENOSCOPY (EGD) WITH PROPOFOL N/A 07/21/2017  ? Procedure: ESOPHAGOGASTRODUODENOSCOPY (EGD) WITH PROPOFOL;  Surgeon: Manya Silvas, MD;  Location: [facility] ENDOSCOPY;  Service: Endoscopy;  Laterality: N/A;  ? ESOPHAGOGASTRODUODENOSCOPY (EGD) WITH PROPOFOL Bilateral 10/18/2017  ? Procedure: ESOPHAGOGASTRODUODENOSCOPY (EGD) WITH PROPOFOL;  Surgeon: Manya Silvas, MD;  Location: [facility] ENDOSCOPY;  Service: Endoscopy;  Laterality: Bilateral;  ? HERNIA REPAIR    ?  LEFT HEART CATH Left 06/08/2017  ? Procedure: Left Heart Cath and Coronary Angiography;  Surgeon: Yolonda Kida, MD;  Location: [place] CV LAB;  Service: Cardiovascular;  Laterality: Left;  ? LID LESION EXCISION Bilateral 05/03/2016  ? Procedure: ,EXCISION CYST RIGHT UPPER EYELID;  Surgeon: Karle Starch, MD;  Location: [place];  Service: Ophthalmology;  Laterality: Bilateral;  Diabetic - oral meds ?CPAP  ? [place]  ? SAVORY DILATION N/A 07/21/2017  ? Procedure: SAVORY DILATION;  Surgeon: Manya Silvas, MD;  Location: [facility] ENDOSCOPY;  Service: Endoscopy;  Laterality: N/A;  ? ? ?Current Medications: ?Current Meds  ?Medication Sig  ?  ascorbic acid (VITAMIN C) 500 MG tablet Take 500 mg by mouth daily.  ? aspirin 325 MG tablet Take 325 mg by mouth daily.  ? azelastine (ASTELIN) 0.1 % nasal spray Place 1 spray into both nostrils 2 (two) times daily. Use in each nostril as directed   ? calcium carbonate (OS-CAL) 1250 (500 Ca) MG chewable tablet Chew 2 tablets by mouth daily as needed.  ? cetirizine (ZYRTEC) 10 MG tablet Take 10 mg by mouth daily.  ? Cholecalciferol 25 MCG (1000 UT) capsule Take 1,000 Units by mouth daily.  ? clopidogrel (PLAVIX) 75 MG tablet Take 1 tablet by mouth daily.  ? Flaxseed, Linseed, (FLAXSEED OIL PO) Take 1 tablet by mouth 2 (two) times daily.   ? fluticasone (FLONASE) 50 MCG/ACT nasal spray Place 1 spray into both nostrils 2 (two) times daily.   ? glipiZIDE (GLUCOTROL XL) 10 MG 24 hr tablet Take 1 tablet by mouth daily.  ? hydrochlorothiazide (HYDRODIURIL) 25 MG tablet Take 25 mg by mouth daily.  ? lisinopril (ZESTRIL) 20 MG tablet Take 20 mg by mouth 2 (two) times daily.  ? metFORMIN (GLUCOPHAGE) 1000 MG tablet Take 1,000 mg by mouth 2 (two) times daily with a meal.  ? metoprolol tartrate (LOPRESSOR) 25 MG tablet Take 12.5 mg by mouth 2 (two) times daily.  ? Multiple Vitamin (MULTIVITAMIN WITH MINERALS) TABS tablet Take 1 tablet by mouth daily.  ? pantoprazole (PROTONIX) 40 MG tablet Take 40 mg by mouth daily before breakfast.   ? Polyethyl Glycol-Propyl Glycol (SYSTANE ULTRA OP) Apply 2 drops to eye 5 (five) times daily as needed (dry eyes).  ? simvastatin (ZOCOR) 40 MG tablet Take 1 tablet by mouth at bedtime.  ? [DISCONTINUED] lisinopril (ZESTRIL) 10 MG tablet Take 1 tablet by mouth 2 (two) times daily.  ?  ? ?Allergies:   Erythromycin base and Other  ? ?Social History  ? ?Socioeconomic History  ? Marital status: Married  ?  Spouse name: Not on file  ? Number of children: Not on file  ? Years of education: Not on file  ? Highest education level: Not on file  ?Occupational History  ? Not on file  ?Tobacco Use  ?  Smoking status: Former  ?  Packs/day: 1.00  ?  Years: 56.00  ?  Pack years: 56.00  ?  Types: Cigarettes  ?  Quit date: 11/04/2015  ?  Years since quitting: 6.4  ? Smokeless tobacco: Never  ?Vaping Use  ? Vaping Use: Never used  ?Substance and Sexual Activity  ? Alcohol use: No  ? Drug use: No  ? Sexual activity: Not on file  ?Other Topics Concern  ? Not on file  ?Social History Narrative  ? Not on file  ? ?Social Determinants of Health  ? ?Financial Resource Strain: Not on  file  ?Food Insecurity: Not on file  ?Transportation Needs: Not on file  ?Physical Activity: Not on file  ?Stress: Not on file  ?Social Connections: Not on file  ?  ? ?Family History: ?The patient's family history includes Diabetes in his brother, maternal aunt, maternal grandmother, and maternal uncle. ? ?ROS:   ?Please see the history of present illness.    ?All other systems reviewed and are negative. ? ?EKGs/Labs/Other Studies Reviewed:   ? ?The following studies were reviewed today: ? ?[facility] records ? ?Blood pressure log from home shows blood pressures typically in the 110s to 130s.  Heart rates are typically in the 50s. ? ?[employer] monitor reviewed from April 2023.  0% atrial fibrillation ?30-day monitor ?49-1 82, average 69 bpm ?Occasional PVCs ?Frequent PACs ?No high degree AV block ?There was a 12 beat run of nonsustained VT at a heart rate of 157 bpm ? ? ?EKG:  The ekg ordered today demonstrates sinus rhythm.  Normal intervals. ? ? ?Recent Labs: ?12/14/2021: BUN 11; Creatinine, Ser 0.93; Hemoglobin 14.0; Platelets 141; Potassium 4.0; Sodium 133  ?Recent Lipid Panel ?No results found for: CHOL, TRIG, HDL, CHOLHDL, VLDL, LDLCALC, LDLDIRECT ? ?Physical Exam:   ? ?VS:  BP (!) 150/72   Pulse 61   Ht '5\' 10"'$  (1.778 m)   Wt 193 lb 9.6 oz (87.8 kg)   SpO2 94%   BMI 27.78 kg/m?    ? ?Wt Readings from Last 3 Encounters:  ?04/27/22 193 lb 9.6 oz (87.8 kg)  ?12/14/21 196 lb 3.4 oz (89 kg)  ?12/09/21 195 lb (88.5 kg)  ?  ? ?GEN:  Well nourished,  well developed in no acute distress.  Elderly ?HEENT: Normal ?NECK: No JVD; No carotid bruits ?LYMPHATICS: No lymphadenopathy ?CARDIAC: RRR, no murmurs, rubs, gallops ?RESPIRATORY:  Clear to auscultation without rales,

## 2022-04-27 ENCOUNTER — Ambulatory Visit: Payer: PPO | Admitting: Cardiology

## 2022-04-27 ENCOUNTER — Encounter: Payer: Self-pay | Admitting: Cardiology

## 2022-04-27 VITALS — BP 150/72 | HR 61 | Ht 70.0 in | Wt 193.6 lb

## 2022-04-27 DIAGNOSIS — R55 Syncope and collapse: Secondary | ICD-10-CM | POA: Diagnosis not present

## 2022-04-27 DIAGNOSIS — I10 Essential (primary) hypertension: Secondary | ICD-10-CM

## 2022-04-27 DIAGNOSIS — I25118 Atherosclerotic heart disease of native coronary artery with other forms of angina pectoris: Secondary | ICD-10-CM

## 2022-04-27 DIAGNOSIS — I480 Paroxysmal atrial fibrillation: Secondary | ICD-10-CM

## 2022-04-27 NOTE — Patient Instructions (Signed)
Medications: ?Your physician recommends that you continue on your current medications as directed. Please refer to the Current Medication list given to you today. ?*If you need a refill on your cardiac medications before your next appointment, please call your pharmacy* ? ?Lab Work: ?None. ?If you have labs (blood work) drawn today and your tests are completely normal, you will receive your results only by: ?MyChart Message (if you have MyChart) OR ?A paper copy in the mail ?If you have any lab test that is abnormal or we need to change your treatment, we will call you to review the results. ? ?Testing/Procedures: ?None. ? ?Follow-Up: ?At [facility], you and your health needs are our priority.  As part of our continuing mission to provide you with exceptional heart care, we have created designated Provider Care Teams.  These Care Teams include your primary Cardiologist (physician) and Advanced Practice Providers (APPs -  Physician Assistants and Nurse Practitioners) who all work together to provide you with the care you need, when you need it. ? ?Your physician wants you to follow-up in: Next available with Lars Mage, MD for in office loop implant to use BSX device. ? ?We recommend signing up for the patient portal called "MyChart".  Sign up information is provided on this After Visit Summary.  MyChart is used to connect with patients for Virtual Visits (Telemedicine).  Patients are able to view lab/test results, encounter notes, upcoming appointments, etc.  Non-urgent messages can be sent to your provider as well.   ?To learn more about what you can do with MyChart, go to [email or web address].   ? ?Any Other Special Instructions Will Be Listed Below (If Applicable). ? ?Implantable Loop Recorder Placement ? ?An implantable loop recorder is a small electronic device that is placed under the skin of your chest. The device records the electrical activity of your heart over a long period of time. Your  health care provider can download these recordings to monitor your heart. ?You may need an implantable loop recorder if you have periods of abnormal heart activity (arrhythmias) or unexplained fainting (syncope). The recorder can be left in place for 1 year or longer. ?Tell a health care provider about: ?Any allergies you have. ?All medicines you are taking, including vitamins, herbs, eye drops, creams, and over-the-counter medicines. ?Any problems you or family members have had with anesthetic medicines. ?Any bleeding problems you have. ?Any surgeries you have had. ?Any medical conditions you have. ?Whether you are pregnant or may be pregnant. ?What are the risks? ?Generally, this is a safe procedure. However, problems may occur, including: ?Infection. ?Bleeding. ?Allergic reactions to anesthetic medicines. ?Damage to nerves or blood vessels. ?Failure of the device to work. This could require another surgery to replace it. ?What happens before the procedure? ? ?You may have a physical exam, blood tests, and imaging tests of your heart, such as a chest X-ray. ?Follow instructions from your health care provider about eating or drinking restrictions. ?Ask your health care provider about: ?Changing or stopping your regular medicines. This is especially important if you are taking diabetes medicines or blood thinners. ?Taking medicines such as aspirin and ibuprofen. These medicines can thin your blood. Do not take these medicines unless your health care provider tells you to take them. ?Taking over-the-counter medicines, vitamins, herbs, and supplements. ?Ask your health care provider how your surgical site will be marked or identified. ?Ask your health care provider what steps will be taken to help prevent infection. These may include: ?Removing hair  at the surgery site. ?Washing skin with a germ-killing soap. ?Plan to have someone take you home from the hospital or clinic. ?Plan to have a responsible adult care for  you for at least 24 hours after you leave the hospital or clinic. This is important. ?Do not use any products that contain nicotine or tobacco, such as cigarettes and e-cigarettes. If you need help quitting, ask your health care provider. ?What happens during the procedure? ?An IV will be inserted into one of your veins. ?You may be given one or more of the following: ?A medicine to help you relax (sedative). ?A medicine to numb the area (local anesthetic). ?A small incision will be made on the left side of your upper chest. ?A pocket will be created under your skin. ?The device will be placed in the pocket. ?The incision will be closed with stitches (sutures) or adhesive strips. ?A bandage (dressing) will be placed over the incision. ?The procedure may vary among health care providers and hospitals. ?What happens after the procedure? ?Your blood pressure, heart rate, breathing rate, and blood oxygen level will be monitored until you leave the hospital or clinic. ?You may be able to go home on the day of your surgery. Before you go home: ?Your health care provider will program your recorder. ?You will learn how to trigger your device with a handheld activator. ?You will learn how to send recordings to your health care provider. ?You will get an ID card for your device, and you will be told when to use it. ?Do not drive for 24 hours if you were given a sedative during your procedure. ?Summary ?An implantable loop recorder is a small electronic device that is placed under the skin of your chest to monitor your heart over a long period of time. ?The recorder can be left in place for 1 year or longer. ?Plan to have someone take you home from the hospital or clinic. ?This information is not intended to replace advice given to you by your health care provider. Make sure you discuss any questions you have with your health care provider. ?Document Revised: 04/06/2021 Document Reviewed: 04/06/2021 ?Elsevier Patient Education  ? [place]. ? ? ?

## 2022-04-28 ENCOUNTER — Ambulatory Visit
Admission: RE | Admit: 2022-04-28 | Discharge: 2022-04-28 | Disposition: A | Discharge status: Home / Self Care | Payer: PPO | Source: Ambulatory Visit | Attending: Urology | Admitting: Urology

## 2022-04-28 DIAGNOSIS — R972 Elevated prostate specific antigen [PSA]: Secondary | ICD-10-CM | POA: Diagnosis not present

## 2022-04-28 DIAGNOSIS — R590 Localized enlarged lymph nodes: Secondary | ICD-10-CM | POA: Diagnosis not present

## 2022-04-28 MED ORDER — GADOBUTROL 1 MMOL/ML IV SOLN
7.5000 mL | Freq: Once | INTRAVENOUS | Status: AC | PRN
  Administered 2022-04-28: 7.5 mL via INTRAVENOUS

## 2022-04-29 ENCOUNTER — Telehealth: Payer: Self-pay | Admitting: Cardiology

## 2022-04-29 NOTE — Telephone Encounter (Signed)
Pt c/o Syncope: STAT if syncope occurred within 30 minutes and pt complains of lightheadedness ?High Priority if episode of passing out, completely, today or in last 24 hours  ? ?Did you pass out today? Patient is not sure if he passed out. He did start to get faint but was not sure if he passed out  ? ?When is the last time you passed out? About 30 min ago ? ?Has this occurred multiple times? Just once today  ? ?Did you have any symptoms prior to passing out? No. The patient said he got hot but he was working with the plumber in his house  ?

## 2022-04-29 NOTE — Telephone Encounter (Signed)
Of note Loop implant schedule for 05/18/22 for syncope and afib burden.  ? ?Patient said he got hot today while helping the pumper put in a water heater in the basement. After that he had lunch (soup) and when he finished he got up from the table to go to the mail box. He made it outside to the steps and was on the second one (which is all of the steps)  holding on to the railing when he said his "mind went blank, maybe a little dizzy." His wife was with him and he went down on his knees. States he felt fine right after and was going to continue walking to the mailbox but his wife would not let him. He came inside to rest. Current VS: 155/95 HR 72. The last couple of readings, he takes every night before bed: 137/77 55, 111/64, 109/69. No missed doses of medication. CBG this morning 117. He ate breakfast and lunch, he was hydrating as well.  ?ED precautions given.  ?Will forward to MD for advisement.  ?

## 2022-05-03 ENCOUNTER — Telehealth: Payer: Self-pay | Admitting: *Deleted

## 2022-05-03 ENCOUNTER — Other Ambulatory Visit: Payer: Self-pay | Admitting: Urology

## 2022-05-03 ENCOUNTER — Telehealth: Payer: Self-pay | Admitting: Urology

## 2022-05-03 DIAGNOSIS — R972 Elevated prostate specific antigen [PSA]: Secondary | ICD-10-CM

## 2022-05-03 NOTE — Telephone Encounter (Signed)
Patient said he would like for you to call him to discuss his MRI results. He said no one called him to discuss them but he was told that he needed a fusion BX with no explanation as to why he needed it and what it was. ?He would like for you to go over all of this with him before he does anything. ?Thanks, ?Sharyn Lull ?

## 2022-05-03 NOTE — Telephone Encounter (Signed)
Please let Bryan Strong know that we need to get cardiac clearance to stop his anticoagulants so we can get him scheduled for a fusion prostate biopsy.  His prostate MRI shows areas that are suspicious for prostate cancer.  ? ? Sent cardiac clearance today Quentin Ore [phone] ?

## 2022-05-03 NOTE — Telephone Encounter (Signed)
? ?  Pre-operative Risk Assessment  ?  ?Patient Name: Bryan Strong  ?DOB: 1946/06/13 ?MRN: [id number]  ? ?  ? ?Request for Surgical Clearance   ? ?Procedure:   FUSION Bx  ? ?Date of Surgery:  Clearance TBD                              ? ?Surgeon:  DR. Nicki Reaper STOIOFF ?Surgeon's Group or Practice Name:  [facility] ?Phone number:  [phone] ?Fax number:  [phone] ?  ?Type of Clearance Requested:   ?- Medical  ?- Pharmacy:  Hold Clopidogrel (Plavix) x 3-5 DAYS PRIOR ?  ?Type of Anesthesia:  Not Indicated ?  ?Additional requests/questions:   ? ?Signed, ?Julaine Hua   ?05/03/2022, 5:31 PM  ? ?

## 2022-05-04 NOTE — Telephone Encounter (Addendum)
? ?  Patient Name: Bryan Strong  ?DOB: August 28, 1946 ?MRN: [id number] ? ?Primary Cardiologist: None ?  ?Chart reviewed as part of pre-operative protocol coverage. It appears patient follows with general cardiology, Dr. Clayborn Bigness, at the [facility].This practice manages patient's Plavix and general cardiology needs. Therefore, surgical clearance request should be sent to patient's primary cardiologist for recommendations for holding Plavix prior to surgery as well as general cardiology clearance. Patient was referred to Dr. Quentin Ore for recent syncope and collapse and is to undergo loop recorder implantation on 05/18/2022 with Dr. Quentin Ore. 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 ? ? ?Pre-op covering staff: ?- Please contact requesting surgeon's office via preferred method (i.e, phone, fax) to inform them of need for clearance from primary cardiologist.  ? ?Thank you. ? ? ?Lenna Sciara, NP ?05/04/2022, 3:29 PM ? ? ?

## 2022-05-04 NOTE — Telephone Encounter (Signed)
See notes from pre op provider. Pt is followed by [facility] only for loop recorder. Pt's primary cardiologist is listed in the pre op providers notes.  ? ?I will forward notes to requesting office to see notes.  ?

## 2022-05-05 ENCOUNTER — Ambulatory Visit (INDEPENDENT_AMBULATORY_CARE_PROVIDER_SITE_OTHER): Payer: PPO | Admitting: Urology

## 2022-05-05 DIAGNOSIS — R972 Elevated prostate specific antigen [PSA]: Secondary | ICD-10-CM | POA: Diagnosis not present

## 2022-05-05 DIAGNOSIS — R9389 Abnormal findings on diagnostic imaging of other specified body structures: Secondary | ICD-10-CM

## 2022-05-05 NOTE — Telephone Encounter (Signed)
Pt calls and states he has been receiving calls from [facility] to schedule a fusion biopsy. He states that he was never made aware of his MRI results and would like to speak with the provider about what his results mean and the steps moving forward. Please advise.

## 2022-05-05 NOTE — Telephone Encounter (Signed)
Called patient & left voice mail.

## 2022-05-06 NOTE — Telephone Encounter (Signed)
I spoke with Bryan Strong regarding his prostate MRI report and fusion biopsy recommendation.  He stated that [name] had called him yesterday and explained.  He had no further questions.  He has an appointment with his cardiologist on Monday for clearance to hold Plavix.

## 2022-05-08 NOTE — Progress Notes (Unsigned)
Bryan Strong presented today for further discussion regarding his prostate MRI results and the need for a prostate biopsy.  I explained that there were areas of concern on the prostate MRI for prostate cancer and that is why we were recommending a biopsy at this time.  I explained that the fusion biopsy performed at [place] is a more targeted biopsy than the TRUS biopsy performed in our office.  He was also concerned that if prostate cancer was found during the biopsy, he would have to have his prostate removed and he did not want to have his prostate removed as his father had severe incontinence after his prostate removal.  I explained that we do not remove the prostate in gentleman of his age and his treatment would most likely radiation.    I spent 10 minutes on the day of the encounter to include pre-visit record review, face-to-face time with the patient, and post-visit ordering of tests. ***

## 2022-05-09 DIAGNOSIS — I208 Other forms of angina pectoris: Secondary | ICD-10-CM | POA: Diagnosis not present

## 2022-05-09 DIAGNOSIS — I10 Essential (primary) hypertension: Secondary | ICD-10-CM | POA: Diagnosis not present

## 2022-05-09 DIAGNOSIS — I471 Supraventricular tachycardia: Secondary | ICD-10-CM | POA: Diagnosis not present

## 2022-05-09 DIAGNOSIS — R55 Syncope and collapse: Secondary | ICD-10-CM | POA: Diagnosis not present

## 2022-05-09 DIAGNOSIS — R42 Dizziness and giddiness: Secondary | ICD-10-CM | POA: Diagnosis not present

## 2022-05-09 DIAGNOSIS — I480 Paroxysmal atrial fibrillation: Secondary | ICD-10-CM | POA: Diagnosis not present

## 2022-05-10 ENCOUNTER — Encounter: Payer: Self-pay | Admitting: Urology

## 2022-05-11 ENCOUNTER — Telehealth: Payer: Self-pay | Admitting: Cardiology

## 2022-05-11 NOTE — Telephone Encounter (Signed)
Advised patient to continue eating and drinking like normal, okay to take all medications. He does not need to have a driver.  Patient verbalized understanding.

## 2022-05-11 NOTE — Telephone Encounter (Signed)
Patient has questions about his procedure on 5/31. He states he was given so general instructions, but didn't give specific instructions, as does he need a driver, which medications to stop taking and when.

## 2022-05-13 DIAGNOSIS — Z79899 Other long term (current) drug therapy: Secondary | ICD-10-CM | POA: Diagnosis not present

## 2022-05-13 DIAGNOSIS — I152 Hypertension secondary to endocrine disorders: Secondary | ICD-10-CM | POA: Diagnosis not present

## 2022-05-13 DIAGNOSIS — E1159 Type 2 diabetes mellitus with other circulatory complications: Secondary | ICD-10-CM | POA: Diagnosis not present

## 2022-05-13 DIAGNOSIS — Z125 Encounter for screening for malignant neoplasm of prostate: Secondary | ICD-10-CM | POA: Diagnosis not present

## 2022-05-13 DIAGNOSIS — E785 Hyperlipidemia, unspecified: Secondary | ICD-10-CM | POA: Diagnosis not present

## 2022-05-13 DIAGNOSIS — E1169 Type 2 diabetes mellitus with other specified complication: Secondary | ICD-10-CM | POA: Diagnosis not present

## 2022-05-18 ENCOUNTER — Ambulatory Visit: Payer: PPO | Admitting: Cardiology

## 2022-05-18 ENCOUNTER — Encounter: Payer: Self-pay | Admitting: Cardiology

## 2022-05-18 VITALS — BP 128/72 | HR 62 | Ht 70.0 in | Wt 194.0 lb

## 2022-05-18 DIAGNOSIS — I10 Essential (primary) hypertension: Secondary | ICD-10-CM | POA: Diagnosis not present

## 2022-05-18 DIAGNOSIS — I480 Paroxysmal atrial fibrillation: Secondary | ICD-10-CM | POA: Diagnosis not present

## 2022-05-18 DIAGNOSIS — R55 Syncope and collapse: Secondary | ICD-10-CM

## 2022-05-18 NOTE — Patient Instructions (Signed)
Medication Instructions:  Your physician recommends that you continue on your current medications as directed. Please refer to the Current Medication list given to you today.  Labwork: None ordered.  Testing/Procedures: None ordered.  Follow-Up:  Your physician wants you to follow-up in: one year with Dr. Lars Mage.     Implantable Loop Recorder Placement, Care After This sheet gives you information about how to care for yourself after your procedure. Your health care provider may also give you more specific instructions. If you have problems or questions, contact your health care provider. What can I expect after the procedure? After the procedure, it is common to have: Soreness or discomfort near the incision. Some swelling or bruising near the incision.  Follow these instructions at home: Incision care  Monitor your cardiac device site for redness, swelling, and drainage. Call the device clinic at [phone] if you experience these symptoms or fever/chills.  Keep the large square bandage on your site for 24 hours and then you may remove it yourself. Keep the steri-strips underneath in place.   You may shower after 72 hours / 3 days from your procedure with the steri-strips in place. They will usually fall off on their own, or may be removed after 10 days. Pat dry.   Avoid lotions, ointments, or perfumes over your incision until it is well-healed.  Please do not submerge in water until your site is completely healed.   Your device is MRI compatible.   Remote monitoring is used to monitor your cardiac device from home. This monitoring is scheduled every month by our office. It allows [place] to keep an eye on the function of your device to ensure it is working properly.  If your wound site starts to bleed apply pressure.      If you have any questions/concerns please call the device clinic at [phone].  Activity  Return to your normal activities.  General  instructions Follow instructions from your health care provider about how to manage your implantable loop recorder and transmit the information. Learn how to activate a recording if this is necessary for your type of device. You may go through a metal detection gate, and you may let someone hold a metal detector over your chest. Show your ID card if needed. Do not have an MRI unless you check with your health care provider first. Take over-the-counter and prescription medicines only as told by your health care provider. Keep all follow-up visits as told by your health care provider. This is important. Contact a health care provider if: You have redness, swelling, or pain around your incision. You have a fever. You have pain that is not relieved by your pain medicine. You have triggered your device because of fainting (syncope) or because of a heartbeat that feels like it is racing, slow, fluttering, or skipping (palpitations). Get help right away if you have: Chest pain. Difficulty breathing. Summary After the procedure, it is common to have soreness or discomfort near the incision. Change your dressing as told by your health care provider. Follow instructions from your health care provider about how to manage your implantable loop recorder and transmit the information. Keep all follow-up visits as told by your health care provider. This is important. This information is not intended to replace advice given to you by your health care provider. Make sure you discuss any questions you have with your health care provider. Document Released: 11/16/2015 Document Revised: 01/20/2018 Document Reviewed: 01/20/2018 Elsevier Patient Education  2020 Elsevier  Inc.

## 2022-05-18 NOTE — Progress Notes (Signed)
Electrophysiology Office Follow up Visit Note:    Date:  05/18/2022   ID:  Bryan Strong, DOB 10-Jan-1946, MRN [id number]  PCP:  Derinda Late, MD  [facility] Cardiologist:  None  [facility] Electrophysiologist:  Vickie Epley, MD    Interval History:    Bryan Strong is a 76 y.o. male who presents for a follow up visit. I last saw the patient 04/27/2022 for a history of pAF and syncope. Some of his syncopal episodes resulted in injury. Multiple heart monitors have been worn which were not revealing. No clear prodrome. I recommended loop recorder implant at our last appointment.  He has been doing well since her last appointment.       Past Medical History:  Diagnosis Date   A-fib ([place])    Achalasia of esophagus    Allergic rhinitis    Anemia    Anginal pain ([place])    Aortic aneurysm ([place])    followed by Dr. Franchot Gallo   Coronary artery disease    Diabetes mellitus without complication ([place])    Dyspnea    Emphysema of lung (HCC)    GERD (gastroesophageal reflux disease)    Heart murmur    Hypercholesteremia    Hypertension    Kidney stones    Skin cancer    Sleep apnea    CPAP   Wears dentures    full upper and lower    Past Surgical History:  Procedure Laterality Date   CHOLECYSTECTOMY     colon polyps     COLONOSCOPY WITH PROPOFOL N/A 09/18/2017   Procedure: COLONOSCOPY WITH PROPOFOL;  Surgeon: Manya Silvas, MD;  Location: [facility] ENDOSCOPY;  Service: Endoscopy;  Laterality: N/A;   CORONARY ANGIOPLASTY WITH STENT PLACEMENT  01/03/11   [facility]  Dr. Saralyn Pilar   ECTROPION REPAIR Bilateral 05/03/2016   Procedure: REPAIR OF ECTROPION BILATERAL LOWER EYELIDS;  Surgeon: Karle Starch, MD;  Location: [place];  Service: Ophthalmology;  Laterality: Bilateral;   ESOPHAGOGASTRODUODENOSCOPY (EGD) WITH PROPOFOL N/A 07/21/2017   Procedure: ESOPHAGOGASTRODUODENOSCOPY (EGD) WITH PROPOFOL;  Surgeon: Manya Silvas, MD;  Location[facility] ENDOSCOPY;  Service:  Endoscopy;  Laterality: N/A;   ESOPHAGOGASTRODUODENOSCOPY (EGD) WITH PROPOFOL Bilateral 10/18/2017   Procedure: ESOPHAGOGASTRODUODENOSCOPY (EGD) WITH PROPOFOL;  Surgeon: Manya Silvas, MD;  Location: [facility] ENDOSCOPY;  Service: Endoscopy;  Laterality: Bilateral;   HERNIA REPAIR     LEFT HEART CATH Left 06/08/2017   Procedure: Left Heart Cath and Coronary Angiography;  Surgeon: Yolonda Kida, MD;  Location: [place] CV LAB;  Service: Cardiovascular;  Laterality: Left;   LID LESION EXCISION Bilateral 05/03/2016   Procedure: ,EXCISION CYST RIGHT UPPER EYELID;  Surgeon: Karle Starch, MD;  Location: [place];  Service: Ophthalmology;  Laterality: Bilateral;  Diabetic - oral meds CPAP   NASAL SEPTUM SURGERY  1967   SAVORY DILATION N/A 07/21/2017   Procedure: SAVORY DILATION;  Surgeon: Manya Silvas, MD;  Location: [facility] ENDOSCOPY;  Service: Endoscopy;  Laterality: N/A;    Current Medications: No outpatient medications have been marked as taking for the 05/18/22 encounter (Appointment) with Vickie Epley, MD.     Allergies:   Erythromycin base and Other   Social History   Socioeconomic History   Marital status: Married    Spouse name: Not on file   Number of children: Not on file   Years of education: Not on file   Highest education level: Not on file  Occupational History  Not on file  Tobacco Use   Smoking status: Former    Packs/day: 1.00    Years: 56.00    Pack years: 56.00    Types: Cigarettes    Quit date: 11/04/2015    Years since quitting: 6.5   Smokeless tobacco: Never  Vaping Use   Vaping Use: Never used  Substance and Sexual Activity   Alcohol use: No   Drug use: No   Sexual activity: Not on file  Other Topics Concern   Not on file  Social History Narrative   Not on file   Social Determinants of Health   Financial Resource Strain: Not on file  Food Insecurity: Not on file  Transportation Needs: Not on file  Physical Activity: Not  on file  Stress: Not on file  Social Connections: Not on file     Family History: The patient's family history includes Diabetes in his brother, maternal aunt, maternal grandmother, and maternal uncle.  ROS:   Please see the history of present illness.    All other systems reviewed and are negative.  EKGs/Labs/Other Studies Reviewed:    The following studies were reviewed today:     Recent Labs: 12/14/2021: BUN 11; Creatinine, Ser 0.93; Hemoglobin 14.0; Platelets 141; Potassium 4.0; Sodium 133  Recent Lipid Panel No results found for: CHOL, TRIG, HDL, CHOLHDL, VLDL, LDLCALC, LDLDIRECT  Physical Exam:    VS:  There were no vitals taken for this visit.    Wt Readings from Last 3 Encounters:  04/27/22 193 lb 9.6 oz (87.8 kg)  12/14/21 196 lb 3.4 oz (89 kg)  12/09/21 195 lb (88.5 kg)     GEN:  Well nourished, well developed in no acute distress HEENT: Normal NECK: No JVD; No carotid bruits LYMPHATICS: No lymphadenopathy CARDIAC: RRR, no murmurs, rubs, gallops RESPIRATORY:  Clear to auscultation without rales, wheezing or rhonchi  ABDOMEN: Soft, non-tender, non-distended MUSCULOSKELETAL:  No edema; No deformity  SKIN: Warm and dry NEUROLOGIC:  Alert and oriented x 3 PSYCHIATRIC:  Normal affect        ASSESSMENT:    1. Syncope and collapse   2. Paroxysmal atrial fibrillation (HCC)   3. Essential hypertension    PLAN:    In order of problems listed above:   #Syncope Unclear cause at this point.  Concerning for an arrhythmic cause given lack of prodrome during some of his episodes.  I recommend loop recorder monitoring.  I discussed the loop recorder implant procedure in detail with the patient again during today's visit including the risks and monthly monitoring cost.  He is okay and wishes to proceed with implant today.  #Hypertension Controlled Continue current medication regimen  #Paroxysmal atrial fibrillation      Medication Adjustments/Labs and  Tests Ordered: Current medicines are reviewed at length with the patient today.  Concerns regarding medicines are outlined above.  No orders of the defined types were placed in this encounter.  No orders of the defined types were placed in this encounter.    Signed, Lars Mage, MD, [facility], [facility] 05/18/2022 5:39 AM    Electrophysiology [facility]  ------------------------------   SURGEON:  Lars Mage, MD    PREPROCEDURE DIAGNOSIS:  Syncope    POSTPROCEDURE DIAGNOSIS:  Syncope     PROCEDURES:   1. Implantable loop recorder implantation    INTRODUCTION:  Mr Dorsey is a 76 y.o. patient with syncope who presents today for implantable loop implantation.      DESCRIPTION OF  PROCEDURE:  Informed written consent was obtained.  The patient required no sedation for the procedure today.   The patients left chest was therefore prepped and draped in the usual sterile fashion. The skin overlying the left parasternal region was infiltrated with lidocaine for local analgesia.  A 0.5-cm incision was made over the left parasternal region over the 3rd intercostal space.  A [employer] Lux-Dx [id number]) implantable loop recorder was then placed into the pocket  R waves were very prominent and measured >0.89m.  Steri- Strips and a sterile dressing were then applied.  There were no early apparent complications.     CONCLUSIONS:   1. Successful implantation of a BChemical engineerLux-Dx implantable loop recorder for syncope.  2. No early apparent complications.   CLysbeth GalasT. LQuentin Ore MD, [facility]Cardiac Electrophysiology

## 2022-05-20 DIAGNOSIS — Z1331 Encounter for screening for depression: Secondary | ICD-10-CM | POA: Diagnosis not present

## 2022-05-20 DIAGNOSIS — Z0000 Encounter for general adult medical examination without abnormal findings: Secondary | ICD-10-CM | POA: Diagnosis not present

## 2022-05-25 DIAGNOSIS — X32XXXA Exposure to sunlight, initial encounter: Secondary | ICD-10-CM | POA: Diagnosis not present

## 2022-05-25 DIAGNOSIS — D2272 Melanocytic nevi of left lower limb, including hip: Secondary | ICD-10-CM | POA: Diagnosis not present

## 2022-05-25 DIAGNOSIS — D225 Melanocytic nevi of trunk: Secondary | ICD-10-CM | POA: Diagnosis not present

## 2022-05-25 DIAGNOSIS — D2261 Melanocytic nevi of right upper limb, including shoulder: Secondary | ICD-10-CM | POA: Diagnosis not present

## 2022-05-25 DIAGNOSIS — D2262 Melanocytic nevi of left upper limb, including shoulder: Secondary | ICD-10-CM | POA: Diagnosis not present

## 2022-05-25 DIAGNOSIS — L821 Other seborrheic keratosis: Secondary | ICD-10-CM | POA: Diagnosis not present

## 2022-05-25 DIAGNOSIS — S30861A Insect bite (nonvenomous) of abdominal wall, initial encounter: Secondary | ICD-10-CM | POA: Diagnosis not present

## 2022-05-25 DIAGNOSIS — D2271 Melanocytic nevi of right lower limb, including hip: Secondary | ICD-10-CM | POA: Diagnosis not present

## 2022-05-25 DIAGNOSIS — D485 Neoplasm of uncertain behavior of skin: Secondary | ICD-10-CM | POA: Diagnosis not present

## 2022-05-25 DIAGNOSIS — D1723 Benign lipomatous neoplasm of skin and subcutaneous tissue of right leg: Secondary | ICD-10-CM | POA: Diagnosis not present

## 2022-05-25 DIAGNOSIS — L570 Actinic keratosis: Secondary | ICD-10-CM | POA: Diagnosis not present

## 2022-05-27 ENCOUNTER — Telehealth: Payer: Self-pay | Admitting: Urology

## 2022-05-27 NOTE — Telephone Encounter (Signed)
Talked with patient and he states he is stilling thinking about doing the biopsy . He states he just wanted to get the seeds implanted . Advised patient we have to do the biopsy first to set up a plan of action.

## 2022-05-27 NOTE — Telephone Encounter (Signed)
Patient called the office today to discuss a prostate biopsy.  He has been contacted by [facility] to schedule, but is not sure that he wants to continue with the procedure.   He would like to discuss the pros/cons further.  Should we set up a telephone visit or an office visit? (Patient was last seen in the office on 5/18.)  Please advise.

## 2022-05-30 ENCOUNTER — Encounter: Payer: Self-pay | Admitting: *Deleted

## 2022-05-30 NOTE — Telephone Encounter (Signed)
Prostate cancer treatments cannot be performed without a diagnosis of prostate cancer.  The only way to achieve this diagnosis is with biopsy.  It is still possible the MRI finding may not be cancer.  Please let me know if he has any additional questions

## 2022-06-08 DIAGNOSIS — C61 Malignant neoplasm of prostate: Secondary | ICD-10-CM | POA: Diagnosis not present

## 2022-06-08 DIAGNOSIS — R972 Elevated prostate specific antigen [PSA]: Secondary | ICD-10-CM | POA: Diagnosis not present

## 2022-06-14 ENCOUNTER — Other Ambulatory Visit: Payer: Self-pay | Admitting: Urology

## 2022-06-14 ENCOUNTER — Ambulatory Visit: Payer: PPO | Admitting: Urology

## 2022-06-14 VITALS — BP 121/67 | HR 60 | Ht 70.0 in | Wt 190.0 lb

## 2022-06-14 DIAGNOSIS — C61 Malignant neoplasm of prostate: Secondary | ICD-10-CM

## 2022-06-14 NOTE — Progress Notes (Signed)
06/14/2022 2:08 PM   Bryan Strong [birth year] [id number]  Referring provider: Derinda Late, MD [phone] [address] [place],  [place] [address]  Chief Complaint  Patient presents with   Follow-up    HPI: 76 y.o. male presents for prostate biopsy follow-up.  Fusion biopsy [facility] 06/08/2022 PSA 9.09; MRI PI-RADS 5/PI-RADS 4 lesions.  45 cc gland.  No definite extracapsular extension, adenopathy or bony pelvic disease.  DRE benign No postbiopsy complaints ROI biopsy x4 PI-RADS 5 lesion and 5 PI-RADS 2 lesion in addition to standard 12 core biopsies Pathology: 4/4 cores PI-RADS 5 lesion positive for Gleason 4+3 adenocarcinoma (70-90% involvement); 5/5 PI-RADS 4 cores showed benign tissue; 9/12 template cores positive for Gleason 4+3 (6 cores) and Gleason 3+4 (3 cores).  Refer to biopsy note for details   PMH: Past Medical History:  Diagnosis Date   A-fib ([place])    Achalasia of esophagus    Allergic rhinitis    Anemia    Anginal pain ([place])    Aortic aneurysm ([place])    followed by Dr. Franchot Gallo   Coronary artery disease    Diabetes mellitus without complication ([place])    Dyspnea    Emphysema of lung (HCC)    GERD (gastroesophageal reflux disease)    Heart murmur    Hypercholesteremia    Hypertension    Kidney stones    Skin cancer    Sleep apnea    CPAP   Wears dentures    full upper and lower    Surgical History: Past Surgical History:  Procedure Laterality Date   CHOLECYSTECTOMY     colon polyps     COLONOSCOPY WITH PROPOFOL N/A 09/18/2017   Procedure: COLONOSCOPY WITH PROPOFOL;  Surgeon: Manya Silvas, MD;  Location: [facility] ENDOSCOPY;  Service: Endoscopy;  Laterality: N/A;   CORONARY ANGIOPLASTY WITH STENT PLACEMENT  01/03/11   [facility]  Dr. Saralyn Pilar   ECTROPION REPAIR Bilateral 05/03/2016   Procedure: REPAIR OF ECTROPION BILATERAL LOWER EYELIDS;  Surgeon: Karle Starch, MD;  Location: [place];  Service:  Ophthalmology;  Laterality: Bilateral;   ESOPHAGOGASTRODUODENOSCOPY (EGD) WITH PROPOFOL N/A 07/21/2017   Procedure: ESOPHAGOGASTRODUODENOSCOPY (EGD) WITH PROPOFOL;  Surgeon: Manya Silvas, MD;  Location[facility] ENDOSCOPY;  Service: Endoscopy;  Laterality: N/A;   ESOPHAGOGASTRODUODENOSCOPY (EGD) WITH PROPOFOL Bilateral 10/18/2017   Procedure: ESOPHAGOGASTRODUODENOSCOPY (EGD) WITH PROPOFOL;  Surgeon: Manya Silvas, MD;  Location: [facility] ENDOSCOPY;  Service: Endoscopy;  Laterality: Bilateral;   HERNIA REPAIR     LEFT HEART CATH Left 06/08/2017   Procedure: Left Heart Cath and Coronary Angiography;  Surgeon: Yolonda Kida, MD;  Location: [place] CV LAB;  Service: Cardiovascular;  Laterality: Left;   LID LESION EXCISION Bilateral 05/03/2016   Procedure: ,EXCISION CYST RIGHT UPPER EYELID;  Surgeon: Karle Starch, MD;  Location: [place];  Service: Ophthalmology;  Laterality: Bilateral;  Diabetic - oral meds CPAP   NASAL SEPTUM SURGERY  1967   SAVORY DILATION N/A 07/21/2017   Procedure: SAVORY DILATION;  Surgeon: Manya Silvas, MD;  Location: [facility] ENDOSCOPY;  Service: Endoscopy;  Laterality: N/A;    Home Medications:  Allergies as of 06/14/2022       Reactions   Erythromycin Base Rash   Other Rash   ERYTHROMYCIN OINTMENT "some nerve pill, caused HTN"        Medication List        Accurate as of June 14, 2022  2:08  PM. If you have any questions, ask your nurse or doctor.          ascorbic acid 500 MG tablet Commonly known as: VITAMIN C Take 500 mg by mouth daily.   azelastine 0.1 % nasal spray Commonly known as: ASTELIN Place 1 spray into both nostrils 2 (two) times daily. Use in each nostril as directed   calcium carbonate 1250 (500 Ca) MG chewable tablet Commonly known as: OS-CAL Chew 2 tablets by mouth daily as needed.   cetirizine 10 MG tablet Commonly known as: ZYRTEC Take 10 mg by mouth daily.   Cholecalciferol 25 MCG (1000 UT) capsule Take  1,000 Units by mouth daily.   clopidogrel 75 MG tablet Commonly known as: PLAVIX Take 1 tablet by mouth daily.   FLAXSEED OIL PO Take 1 tablet by mouth 2 (two) times daily.   fluticasone 50 MCG/ACT nasal spray Commonly known as: FLONASE Place 1 spray into both nostrils 2 (two) times daily.   glipiZIDE 10 MG 24 hr tablet Commonly known as: GLUCOTROL XL Take 1 tablet by mouth daily.   hydrochlorothiazide 25 MG tablet Commonly known as: HYDRODIURIL Take 25 mg by mouth daily.   lisinopril 20 MG tablet Commonly known as: ZESTRIL Take 20 mg by mouth 2 (two) times daily.   metFORMIN 1000 MG tablet Commonly known as: GLUCOPHAGE Take 1,000 mg by mouth 2 (two) times daily with a meal.   metoprolol tartrate 25 MG tablet Commonly known as: LOPRESSOR Take 12.5 mg by mouth 2 (two) times daily.   multivitamin with minerals Tabs tablet Take 1 tablet by mouth daily.   pantoprazole 40 MG tablet Commonly known as: PROTONIX Take 40 mg by mouth daily before breakfast.   simvastatin 40 MG tablet Commonly known as: ZOCOR Take 1 tablet by mouth at bedtime.   SYSTANE ULTRA OP Apply 2 drops to eye 5 (five) times daily as needed (dry eyes).        Allergies:  Allergies  Allergen Reactions   Erythromycin Base Rash   Other Rash    ERYTHROMYCIN OINTMENT "some nerve pill, caused HTN"    Family History: Family History  Problem Relation Age of Onset   Diabetes Brother    Diabetes Maternal Aunt    Diabetes Maternal Uncle    Diabetes Maternal Grandmother     Social History:  reports that he quit smoking about 6 years ago. His smoking use included cigarettes. He has a 56.00 pack-year smoking history. He has never used smokeless tobacco. He reports that he does not drink alcohol and does not use drugs.   Physical Exam: BP 121/67   Pulse 60   Ht '5\' 10"'$  (1.778 m)   Wt 190 lb (86.2 kg)   BMI 27.26 kg/m   Constitutional:  Alert and oriented, No acute distress. Psychiatric:  Normal mood and affect.   Assessment & Plan:     1.  T1c adenocarcinoma prostate NCCN intermediate risk unfavorable Management options had previously been discussed and he was interested in radiation modalities Since his prostate MRI showed no evidence of adenopathy or definite extracapsular disease will schedule bone scan and lieu of PSMA Order was entered and he will be notified with the results Radiation oncology referral placed   I spent 30 total minutes on the day of the encounter including pre-visit review of the medical record, face-to-face time with the patient, and post visit ordering of labs/imaging/tests.  Abbie Sons, MD  [facility] [address], [address] [place], [place] [address] [phone]  

## 2022-06-15 ENCOUNTER — Other Ambulatory Visit: Payer: Self-pay | Admitting: Urology

## 2022-06-19 ENCOUNTER — Encounter: Payer: Self-pay | Admitting: Urology

## 2022-06-20 ENCOUNTER — Ambulatory Visit (INDEPENDENT_AMBULATORY_CARE_PROVIDER_SITE_OTHER): Payer: PPO

## 2022-06-20 DIAGNOSIS — R55 Syncope and collapse: Secondary | ICD-10-CM | POA: Diagnosis not present

## 2022-06-21 LAB — CUP PACEART REMOTE DEVICE CHECK
Date Time Interrogation Session: 20230703111007
Implantable Pulse Generator Implant Date: 20230531
Pulse Gen Serial Number: 183770

## 2022-06-27 NOTE — Progress Notes (Signed)
Routing to Dr. Quentin Ore per his request for tachy EGM

## 2022-06-29 ENCOUNTER — Ambulatory Visit
Admission: RE | Admit: 2022-06-29 | Discharge: 2022-06-29 | Disposition: A | Discharge status: Home / Self Care | Payer: PPO | Source: Ambulatory Visit | Attending: Radiation Oncology | Admitting: Radiation Oncology

## 2022-06-29 ENCOUNTER — Encounter: Payer: Self-pay | Admitting: Radiation Oncology

## 2022-06-29 VITALS — BP 132/74 | HR 58 | Temp 97.8°F | Resp 20 | Ht 70.0 in | Wt 194.0 lb

## 2022-06-29 DIAGNOSIS — Z510 Encounter for antineoplastic radiation therapy: Secondary | ICD-10-CM | POA: Insufficient documentation

## 2022-06-29 DIAGNOSIS — C61 Malignant neoplasm of prostate: Secondary | ICD-10-CM

## 2022-06-29 DIAGNOSIS — Z87891 Personal history of nicotine dependence: Secondary | ICD-10-CM | POA: Insufficient documentation

## 2022-06-29 DIAGNOSIS — R972 Elevated prostate specific antigen [PSA]: Secondary | ICD-10-CM | POA: Diagnosis not present

## 2022-06-29 NOTE — Consult Note (Signed)
NEW PATIENT EVALUATION  Name: Bryan Strong  MRN: [id number]  Date:   06/29/2022     DOB: [birth year]   This 76 y.o. male patient presents to the clinic for initial evaluation of stage IIc (cT1 cN0 M0) Gleason 7 (4+3) adenocarcinoma the prostate presenting with a PSA of 9.  REFERRING PHYSICIAN: Derinda Late, MD  CHIEF COMPLAINT:  Chief Complaint  Patient presents with   Prostate Cancer    DIAGNOSIS: There were no encounter diagnoses.   PREVIOUS INVESTIGATIONS:  MRI scan reviewed Pathology reports reviewed Clinical notes reviewed  HPI: Patient is a 76 year old male presented to urology with a PSA in the 9 range.  MRI scan of his prostate was performed showing a PI-RADS category 5 lesion in the left peripheral zone with greater than 1.5 cm capsular contact.  He underwent fusion biopsy 9 out of 12 core positive for Gleason 7 (4+3) adenocarcinoma.  He has been seen by urology.  He does have some nocturia x3-4 no other specific urgency or frequency.  He is having no bone pain.  He is now referred to radiation oncology for consideration of treatment.  PLANNED TREATMENT REGIMEN: Image guided IMRT radiation therapy to his prostate and pelvic nodes  PAST MEDICAL HISTORY:  has a past medical history of A-fib ([place]), Achalasia of esophagus, Allergic rhinitis, Anemia, Anginal pain ([place]), Aortic aneurysm ([place]), Coronary artery disease, Diabetes mellitus without complication ([place]), Dyspnea, Emphysema of lung ([place]), GERD (gastroesophageal reflux disease), Heart murmur, Hypercholesteremia, Hypertension, Kidney stones, Skin cancer, Sleep apnea, and Wears dentures.    PAST SURGICAL HISTORY:  Past Surgical History:  Procedure Laterality Date   CHOLECYSTECTOMY     colon polyps     COLONOSCOPY WITH PROPOFOL N/A 09/18/2017   Procedure: COLONOSCOPY WITH PROPOFOL;  Surgeon: Manya Silvas, MD;  Location: [facility] ENDOSCOPY;  Service: Endoscopy;  Laterality: N/A;   CORONARY ANGIOPLASTY WITH STENT PLACEMENT   01/03/11   [facility]  Dr. Saralyn Pilar   ECTROPION REPAIR Bilateral 05/03/2016   Procedure: REPAIR OF ECTROPION BILATERAL LOWER EYELIDS;  Surgeon: Karle Starch, MD;  Location: [place];  Service: Ophthalmology;  Laterality: Bilateral;   ESOPHAGOGASTRODUODENOSCOPY (EGD) WITH PROPOFOL N/A 07/21/2017   Procedure: ESOPHAGOGASTRODUODENOSCOPY (EGD) WITH PROPOFOL;  Surgeon: Manya Silvas, MD;  Location: [facility] ENDOSCOPY;  Service: Endoscopy;  Laterality: N/A;   ESOPHAGOGASTRODUODENOSCOPY (EGD) WITH PROPOFOL Bilateral 10/18/2017   Procedure: ESOPHAGOGASTRODUODENOSCOPY (EGD) WITH PROPOFOL;  Surgeon: Manya Silvas, MD;  Location[facility] ENDOSCOPY;  Service: Endoscopy;  Laterality: Bilateral;   HERNIA REPAIR     LEFT HEART CATH Left 06/08/2017   Procedure: Left Heart Cath and Coronary Angiography;  Surgeon: Yolonda Kida, MD;  Location: [place] CV LAB;  Service: Cardiovascular;  Laterality: Left;   LID LESION EXCISION Bilateral 05/03/2016   Procedure: ,EXCISION CYST RIGHT UPPER EYELID;  Surgeon: Karle Starch, MD;  Location: [place];  Service: Ophthalmology;  Laterality: Bilateral;  Diabetic - oral meds CPAP   NASAL SEPTUM SURGERY  1967   SAVORY DILATION N/A 07/21/2017   Procedure: SAVORY DILATION;  Surgeon: Manya Silvas, MD;  Location: [facility] ENDOSCOPY;  Service: Endoscopy;  Laterality: N/A;    FAMILY HISTORY: family history includes Diabetes in his brother, maternal aunt, maternal grandmother, and maternal uncle.  SOCIAL HISTORY:  reports that he quit smoking about 6 years ago. His smoking use included cigarettes. He has a 56.00 pack-year smoking history. He has never used smokeless tobacco. He reports that he does not drink alcohol and  does not use drugs.  ALLERGIES: Erythromycin base and Other  MEDICATIONS:  Current Outpatient Medications  Medication Sig Dispense Refill   ascorbic acid (VITAMIN C) 500 MG tablet Take 500 mg by mouth daily.     azelastine (ASTELIN) 0.1 %  nasal spray Place 1 spray into both nostrils 2 (two) times daily. Use in each nostril as directed      calcium carbonate (OS-CAL) 1250 (500 Ca) MG chewable tablet Chew 2 tablets by mouth daily as needed.     cetirizine (ZYRTEC) 10 MG tablet Take 10 mg by mouth daily.     Cholecalciferol 25 MCG (1000 UT) capsule Take 1,000 Units by mouth daily.     clopidogrel (PLAVIX) 75 MG tablet Take 1 tablet by mouth daily.     Flaxseed, Linseed, (FLAXSEED OIL PO) Take 1 tablet by mouth 2 (two) times daily.      fluticasone (FLONASE) 50 MCG/ACT nasal spray Place 1 spray into both nostrils 2 (two) times daily.      glipiZIDE (GLUCOTROL XL) 10 MG 24 hr tablet Take 1 tablet by mouth daily.     hydrochlorothiazide (HYDRODIURIL) 25 MG tablet Take 25 mg by mouth daily.     lisinopril (ZESTRIL) 20 MG tablet Take 20 mg by mouth 2 (two) times daily.     metFORMIN (GLUCOPHAGE) 1000 MG tablet Take 1,000 mg by mouth 2 (two) times daily with a meal.     metoprolol tartrate (LOPRESSOR) 25 MG tablet Take 12.5 mg by mouth 2 (two) times daily.     Multiple Vitamin (MULTIVITAMIN WITH MINERALS) TABS tablet Take 1 tablet by mouth daily.     pantoprazole (PROTONIX) 40 MG tablet Take 40 mg by mouth daily before breakfast.      Polyethyl Glycol-Propyl Glycol (SYSTANE ULTRA OP) Apply 2 drops to eye 5 (five) times daily as needed (dry eyes).     simvastatin (ZOCOR) 40 MG tablet Take 1 tablet by mouth at bedtime.     No current facility-administered medications for this encounter.    ECOG PERFORMANCE STATUS:  0 - Asymptomatic  REVIEW OF SYSTEMS: Patient denies any weight loss, fatigue, weakness, fever, chills or night sweats. Patient denies any loss of vision, blurred vision. Patient denies any ringing  of the ears or hearing loss. No irregular heartbeat. Patient denies heart murmur or history of fainting. Patient denies any chest pain or pain radiating to her upper extremities. Patient denies any shortness of breath, difficulty  breathing at night, cough or hemoptysis. Patient denies any swelling in the lower legs. Patient denies any nausea vomiting, vomiting of blood, or coffee ground material in the vomitus. Patient denies any stomach pain. Patient states has had normal bowel movements no significant constipation or diarrhea. Patient denies any dysuria, hematuria or significant nocturia. Patient denies any problems walking, swelling in the joints or loss of balance. Patient denies any skin changes, loss of hair or loss of weight. Patient denies any excessive worrying or anxiety or significant depression. Patient denies any problems with insomnia. Patient denies excessive thirst, polyuria, polydipsia. Patient denies any swollen glands, patient denies easy bruising or easy bleeding. Patient denies any recent infections, allergies or URI. Patient "s visual fields have not changed significantly in recent time.   PHYSICAL EXAM: BP 132/74 (BP Location: Left Arm, Patient Position: Sitting, Cuff Size: Normal)   Pulse (!) 58   Temp 97.8 F (36.6 C) (Tympanic)   Resp 20   Ht '5\' 10"'$  (1.778 m) Comment: stated ht  Wt  194 lb (88 kg)   BMI 27.84 kg/m  Well-developed well-nourished patient in NAD. HEENT reveals PERLA, EOMI, discs not visualized.  Oral cavity is clear. No oral mucosal lesions are identified. Neck is clear without evidence of cervical or supraclavicular adenopathy. Lungs are clear to A&P. Cardiac examination is essentially unremarkable with regular rate and rhythm without murmur rub or thrill. Abdomen is benign with no organomegaly or masses noted. Motor sensory and DTR levels are equal and symmetric in the upper and lower extremities. Cranial nerves II through XII are grossly intact. Proprioception is intact. No peripheral adenopathy or edema is identified. No motor or sensory levels are noted. Crude visual fields are within normal range.  LABORATORY DATA: Pathology reports reviewed    RADIOLOGY RESULTS: MRI scan reviewed  compatible with above-stated findings   IMPRESSION: Stage IIc Gleason 7 (4+3) adenocarcinoma the prostate presenting with a PSA in the 9 range in 76 year old male  PLAN: At this time based on the patient's age and [facility] nomogram data showing approximate 25% of lymph node involvement and most definitively by MRI and nomogram results extracapsular extension I would recommend image guided IMRT radiation therapy to his prostate and pelvic nodes.  We will treat his prostate to 33 [place] Cables his pelvic nodes to 54 Gray using IMRT dose painting technique.  I have also asked Dr. Bernardo Heater to place fiducial markers in his prostate for daily image guided treatment as well as start him on Eligard therapy with a 45-monthdepot.  Risks and benefits of treatment occluding increased lower urinary tract symptoms diarrhea fatigue alteration of blood count skin reaction all were reviewed discussed in detail with the patient.  He comprehends my treatment plan well.  Once markers are placed we will set him up for simulation.  I would like to take this opportunity to thank you for allowing me to participate in the care of your patient..Noreene Filbert MD

## 2022-06-30 ENCOUNTER — Encounter: Payer: Self-pay | Admitting: *Deleted

## 2022-06-30 ENCOUNTER — Telehealth: Payer: Self-pay

## 2022-06-30 ENCOUNTER — Telehealth: Payer: Self-pay | Admitting: *Deleted

## 2022-06-30 DIAGNOSIS — C61 Malignant neoplasm of prostate: Secondary | ICD-10-CM | POA: Diagnosis not present

## 2022-06-30 DIAGNOSIS — Z191 Hormone sensitive malignancy status: Secondary | ICD-10-CM | POA: Diagnosis not present

## 2022-06-30 NOTE — Telephone Encounter (Signed)
The bone scan is still recommended.  Would recommend it be done prior to radiation.  Looks like he is scheduled for fiducial marker placement on the 28th so I would keep the bone scan scheduled 7/26

## 2022-06-30 NOTE — Telephone Encounter (Signed)
Patient called stating he was given some "target things to take to Urologist and then he got a call that Urology had ordered a Bone scan and has been scheduled for that, he is asking if the markers will interfere with the bone scan

## 2022-06-30 NOTE — Telephone Encounter (Signed)
Patient left a vmail on the triage line stating that his bone scan is scheduled for 07-13-22. He has decided to start radiation therapy with Dr. Baruch Gouty. He would like to know if the bone scan is still needed and if so is it ok to have it after radiation has started?please advise

## 2022-07-01 ENCOUNTER — Telehealth: Payer: Self-pay

## 2022-07-01 NOTE — Telephone Encounter (Signed)
No PA required. Pt previously scheduled.

## 2022-07-01 NOTE — Telephone Encounter (Signed)
-----   Message from Manus Rudd, RN sent at 06/29/2022  2:44 PM EDT ----- Regarding: Markers Placed and Elegard Patient needs markers placed and Eligaurd injection. Patient has markers and is aware that you will call with appointment. Thanks

## 2022-07-12 DIAGNOSIS — B351 Tinea unguium: Secondary | ICD-10-CM | POA: Diagnosis not present

## 2022-07-12 DIAGNOSIS — M79671 Pain in right foot: Secondary | ICD-10-CM | POA: Diagnosis not present

## 2022-07-12 DIAGNOSIS — M79672 Pain in left foot: Secondary | ICD-10-CM | POA: Diagnosis not present

## 2022-07-12 NOTE — Progress Notes (Signed)
MRN : [id number]  Bryan Strong is a 76 y.o. ([birth year]) male who presents with chief complaint of check circulation.  History of Present Illness:   The patient returns to the office for surveillance of a known abdominal aortic aneurysm. Patient denies abdominal pain or back pain, no other abdominal complaints. No changes suggesting embolic episodes.   There have been no interval changes in the patient's overall health care since his last visit.   Patient denies amaurosis fugax or TIA symptoms. There is no history of claudication or rest pain symptoms of the lower extremities. The patient denies angina or shortness of breath.   Duplex US of the aorta and iliac arteries today shows an AAA measured 3.27 cm with 1.30 cm left iliac artery aneurysm (previous AAA ultrasound showed an AAA measured 3.25 cm with 1.20 cm left iliac artery aneurysm.     No outpatient medications have been marked as taking for the 07/14/22 encounter (Appointment) with Delana Meyer, Dolores Lory, MD.    Past Medical History:  Diagnosis Date   A-fib [facility])    Achalasia of esophagus    Allergic rhinitis    Anemia    Anginal pain ([place])    Aortic aneurysm ([place])    followed by Dr. Franchot Gallo   Coronary artery disease    Diabetes mellitus without complication ([place])    Dyspnea    Emphysema of lung ([place])    GERD (gastroesophageal reflux disease)    Heart murmur    Hypercholesteremia    Hypertension    Kidney stones    Skin cancer    Sleep apnea    CPAP   Wears dentures    full upper and lower    Past Surgical History:  Procedure Laterality Date   CHOLECYSTECTOMY     colon polyps     COLONOSCOPY WITH PROPOFOL N/A 09/18/2017   Procedure: COLONOSCOPY WITH PROPOFOL;  Surgeon: Manya Silvas, MD;  Location: [facility] ENDOSCOPY;  Service: Endoscopy;  Laterality: N/A;   CORONARY ANGIOPLASTY WITH STENT PLACEMENT  01/03/11   [facility]  Dr. Saralyn Pilar   ECTROPION REPAIR Bilateral 05/03/2016   Procedure: REPAIR OF ECTROPION  BILATERAL LOWER EYELIDS;  Surgeon: Karle Starch, MD;  Location: [place];  Service: Ophthalmology;  Laterality: Bilateral;   ESOPHAGOGASTRODUODENOSCOPY (EGD) WITH PROPOFOL N/A 07/21/2017   Procedure: ESOPHAGOGASTRODUODENOSCOPY (EGD) WITH PROPOFOL;  Surgeon: Manya Silvas, MD;  Location: [facility] ENDOSCOPY;  Service: Endoscopy;  Laterality: N/A;   ESOPHAGOGASTRODUODENOSCOPY (EGD) WITH PROPOFOL Bilateral 10/18/2017   Procedure: ESOPHAGOGASTRODUODENOSCOPY (EGD) WITH PROPOFOL;  Surgeon: Manya Silvas, MD;  Location: [facility] ENDOSCOPY;  Service: Endoscopy;  Laterality: Bilateral;   HERNIA REPAIR     LEFT HEART CATH Left 06/08/2017   Procedure: Left Heart Cath and Coronary Angiography;  Surgeon: Yolonda Kida, MD;  Location: [place] CV LAB;  Service: Cardiovascular;  Laterality: Left;   LID LESION EXCISION Bilateral 05/03/2016   Procedure: ,EXCISION CYST RIGHT UPPER EYELID;  Surgeon: Karle Starch, MD;  Location: [place];  Service: Ophthalmology;  Laterality: Bilateral;  Diabetic - oral meds CPAP   NASAL SEPTUM SURGERY  1967   SAVORY DILATION N/A 07/21/2017   Procedure: SAVORY DILATION;  Surgeon: Manya Silvas, MD;  Location: [facility] ENDOSCOPY;  Service: Endoscopy;  Laterality: N/A;    Social History Social History   Tobacco Use   Smoking status: Former    Packs/day: 1.00    Years: 56.00    Total pack years: 56.00  Types: Cigarettes    Quit date: 11/04/2015    Years since quitting: 6.6   Smokeless tobacco: Never  Vaping Use   Vaping Use: Never used  Substance Use Topics   Alcohol use: No   Drug use: No    Family History Family History  Problem Relation Age of Onset   Diabetes Brother    Diabetes Maternal Aunt    Diabetes Maternal Uncle    Diabetes Maternal Grandmother     Allergies  Allergen Reactions   Erythromycin Base Rash   Other Rash    ERYTHROMYCIN OINTMENT "some nerve pill, caused HTN"     REVIEW OF SYSTEMS (Negative unless  checked)  Constitutional: '[]'$ Weight loss  '[]'$ Fever  '[]'$ Chills Cardiac: '[]'$ Chest pain   '[]'$ Chest pressure   '[]'$ Palpitations   '[]'$ Shortness of breath when laying flat   '[]'$ Shortness of breath with exertion. Vascular:  '[x]'$ Pain in legs with walking   '[]'$ Pain in legs at rest  '[]'$ History of DVT   '[]'$ Phlebitis   '[]'$ Swelling in legs   '[]'$ Varicose veins   '[]'$ Non-healing ulcers Pulmonary:   '[]'$ Uses home oxygen   '[]'$ Productive cough   '[]'$ Hemoptysis   '[]'$ Wheeze  '[]'$ COPD   '[]'$ Asthma Neurologic:  '[]'$ Dizziness   '[]'$ Seizures   '[]'$ History of stroke   '[]'$ History of TIA  '[]'$ Aphasia   '[]'$ Vissual changes   '[]'$ Weakness or numbness in arm   '[]'$ Weakness or numbness in leg Musculoskeletal:   '[]'$ Joint swelling   '[]'$ Joint pain   '[]'$ Low back pain Hematologic:  '[]'$ Easy bruising  '[]'$ Easy bleeding   '[]'$ Hypercoagulable state   '[]'$ Anemic Gastrointestinal:  '[]'$ Diarrhea   '[]'$ Vomiting  '[]'$ Gastroesophageal reflux/heartburn   '[]'$ Difficulty swallowing. Genitourinary:  '[]'$ Chronic kidney disease   '[]'$ Difficult urination  '[]'$ Frequent urination   '[]'$ Blood in urine Skin:  '[]'$ Rashes   '[]'$ Ulcers  Psychological:  '[]'$ History of anxiety   '[]'$  History of major depression.  Physical Examination  There were no vitals filed for this visit. There is no height or weight on file to calculate BMI. Gen: WD/WN, NAD Head: [place]/AT, No temporalis wasting.  Ear/Nose/Throat: Hearing grossly intact, nares w/o erythema or drainage Eyes: PER, EOMI, sclera nonicteric.  Neck: Supple, no masses.  No bruit or JVD.  Pulmonary:  Good air movement, no audible wheezing, no use of accessory muscles.  Cardiac: RRR, normal S1, S2, no Murmurs. Vascular:  mild trophic changes, no open wounds Vessel Right Left  Radial Palpable Palpable  PT Not Palpable Not Palpable  DP Not Palpable Not Palpable  Gastrointestinal: soft, non-distended. No guarding/no peritoneal signs.  Musculoskeletal: M/S 5/5 throughout.  No visible deformity.  Neurologic: CN 2-12 intact. Pain and light touch intact in extremities.  Symmetrical.   Speech is fluent. Motor exam as listed above. Psychiatric: Judgment intact, Mood & affect appropriate for pt's clinical situation. Dermatologic: No rashes or ulcers noted.  No changes consistent with cellulitis.   CBC Lab Results  Component Value Date   WBC 5.4 12/14/2021   HGB 14.0 12/14/2021   HCT 41.6 12/14/2021   MCV 89.8 12/14/2021   PLT 141 (L) 12/14/2021    BMET    Component Value Date/Time   NA 133 (L) 12/14/2021 1042   NA 140 01/11/2013 2241   K 4.0 12/14/2021 1042   K 3.7 01/11/2013 2241   CL 98 12/14/2021 1042   CL 105 01/11/2013 2241   CO2 28 12/14/2021 1042   CO2 29 01/11/2013 2241   GLUCOSE 272 (H) 12/14/2021 1042   GLUCOSE 219 (H) 01/11/2013 2241   BUN 11 12/14/2021 1042  BUN 14 01/11/2013 2241   CREATININE 0.93 12/14/2021 1042   CREATININE 1.06 01/11/2013 2241   CALCIUM 9.5 12/14/2021 1042   CALCIUM 9.4 01/11/2013 2241   GFRNONAA >60 12/14/2021 1042   GFRNONAA >60 01/11/2013 2241   GFRAA >60 06/11/2020 1244   GFRAA >60 01/11/2013 2241   CrCl cannot be calculated (Patient's most recent lab result is older than the maximum 21 days allowed.).  COAG No results found for: "INR", "PROTIME"  Radiology CUP PACEART REMOTE DEVICE CHECK  Result Date: 06/21/2022 ILR summary report received. Battery status OK. Normal device function. No new symptom, brady, or pause episodes. No new AF episodes.  AF burden is 0% of the time.   There was one tachy episodes that was viewed and reviewed.   Monthly summary reports and  ROV/PRN Kathy Breach, RN, CCDS, [employer]    Assessment/Plan 1. Infrarenal abdominal aortic aneurysm (AAA) without rupture (HCC) Recommend: No surgery or intervention is indicated at this time.  The patient has an asymptomatic abdominal aortic aneurysm that is less than 4 cm in maximal diameter.    I have reviewed the natural history of abdominal aortic aneurysm and the small risk of rupture for aneurysm less than 5 cm in  size.  However, as these small aneurysms tend to enlarge over time, continued surveillance with ultrasound or CT scan is mandatory.   I have also discussed optimizing medical management with hypertension and lipid control and the importance of abstinence from tobacco.  The patient is also encouraged to exercise a minimum of 30 minutes 4 times a week.   Should the patient develop new onset abdominal or back pain or signs of peripheral embolization they are instructed to seek medical attention immediately and to alert the physician providing care that they have an aneurysm.   The patient voices their understanding.  The patient will return in 12 months with an aortic duplex.  - VAS US AORTA/IVC/ILIACS; Future  2. Essential hypertension Continue antihypertensive medications as already ordered, these medications have been reviewed and there are no changes at this time.   3. Coronary artery disease of native artery of native heart with stable angina pectoris (HCC) Continue cardiac and antihypertensive medications as already ordered and reviewed, no changes at this time.  Continue statin as ordered and reviewed, no changes at this time  Nitrates PRN for chest pain   4. Paroxysmal atrial fibrillation (HCC) Continue antiarrhythmia medications as already ordered, these medications have been reviewed and there are no changes at this time.  Continue anticoagulation as ordered by Cardiology Service   5. Pure hypercholesterolemia Continue statin as ordered and reviewed, no changes at this time     Hortencia Pilar, MD  07/12/2022 9:43 AM

## 2022-07-13 ENCOUNTER — Encounter
Admission: RE | Admit: 2022-07-13 | Discharge: 2022-07-13 | Disposition: A | Discharge status: Home / Self Care | Payer: PPO | Source: Ambulatory Visit | Attending: Urology | Admitting: Urology

## 2022-07-13 DIAGNOSIS — C61 Malignant neoplasm of prostate: Secondary | ICD-10-CM | POA: Insufficient documentation

## 2022-07-13 MED ORDER — TECHNETIUM TC 99M MEDRONATE IV KIT
20.0000 | PACK | Freq: Once | INTRAVENOUS | Status: AC | PRN
  Administered 2022-07-13: 21.66 via INTRAVENOUS

## 2022-07-13 NOTE — Progress Notes (Signed)
Carelink Summary Report / Loop Recorder 

## 2022-07-14 ENCOUNTER — Encounter (INDEPENDENT_AMBULATORY_CARE_PROVIDER_SITE_OTHER): Payer: Self-pay | Admitting: Vascular Surgery

## 2022-07-14 ENCOUNTER — Ambulatory Visit (INDEPENDENT_AMBULATORY_CARE_PROVIDER_SITE_OTHER): Payer: PPO

## 2022-07-14 ENCOUNTER — Ambulatory Visit (INDEPENDENT_AMBULATORY_CARE_PROVIDER_SITE_OTHER): Payer: PPO | Admitting: Vascular Surgery

## 2022-07-14 VITALS — BP 115/69 | HR 60 | Resp 16 | Wt 190.8 lb

## 2022-07-14 DIAGNOSIS — E7800 Pure hypercholesterolemia, unspecified: Secondary | ICD-10-CM | POA: Diagnosis not present

## 2022-07-14 DIAGNOSIS — I480 Paroxysmal atrial fibrillation: Secondary | ICD-10-CM

## 2022-07-14 DIAGNOSIS — I10 Essential (primary) hypertension: Secondary | ICD-10-CM

## 2022-07-14 DIAGNOSIS — I7143 Infrarenal abdominal aortic aneurysm, without rupture: Secondary | ICD-10-CM | POA: Diagnosis not present

## 2022-07-14 DIAGNOSIS — I25118 Atherosclerotic heart disease of native coronary artery with other forms of angina pectoris: Secondary | ICD-10-CM

## 2022-07-14 DIAGNOSIS — I714 Abdominal aortic aneurysm, without rupture, unspecified: Secondary | ICD-10-CM | POA: Diagnosis not present

## 2022-07-15 ENCOUNTER — Encounter: Payer: Self-pay | Admitting: Urology

## 2022-07-15 ENCOUNTER — Ambulatory Visit: Payer: PPO | Admitting: Urology

## 2022-07-15 VITALS — BP 146/78 | HR 64 | Ht 70.0 in | Wt 190.0 lb

## 2022-07-15 DIAGNOSIS — C61 Malignant neoplasm of prostate: Secondary | ICD-10-CM

## 2022-07-15 MED ORDER — CEPHALEXIN 250 MG PO CAPS
500.0000 mg | ORAL_CAPSULE | Freq: Once | ORAL | Status: AC
  Administered 2022-07-15: 500 mg via ORAL

## 2022-07-15 MED ORDER — GENTAMICIN SULFATE 40 MG/ML IJ SOLN
80.0000 mg | Freq: Once | INTRAMUSCULAR | Status: AC
  Administered 2022-07-15: 80 mg via INTRAMUSCULAR

## 2022-07-15 MED ORDER — LEUPROLIDE ACETATE (6 MONTH) 45 MG ~~LOC~~ KIT
45.0000 mg | PACK | Freq: Once | SUBCUTANEOUS | Status: AC
  Administered 2022-07-15: 45 mg via SUBCUTANEOUS

## 2022-07-15 NOTE — Patient Instructions (Signed)
Vitamin D 800-1000iu and Calcium 1000-1200mg daily while on Androgen Deprivation Therapy.  

## 2022-07-15 NOTE — Progress Notes (Unsigned)
Eligard SubQ Injection   Due to Prostate Cancer patient is present today for a Eligard Injection.  Medication: Eligard 6 month Dose: 45 mg  Location: left  Lot: [id number] Exp: 10/20/2023  Patient tolerated well, no complications were noted  Performed by: Elberta Leatherwood, [place]  Per Dr. Bernardo Heater patient is to continue therapy for 6 months. This appointment was scheduled using wheel and given to patient today along with reminder continue on Vitamin D 800-1000iu and Calcium 1000-'1200mg'$  daily while on Androgen Deprivation Therapy.  PA approval dates:

## 2022-07-17 ENCOUNTER — Encounter (INDEPENDENT_AMBULATORY_CARE_PROVIDER_SITE_OTHER): Payer: Self-pay | Admitting: Vascular Surgery

## 2022-07-17 NOTE — Progress Notes (Signed)
07/17/22  CC: gold fiducial marker placement  HPI: 76 y.o. male with prostate cancer who presents today for placement of fiducial seed markers in anticipation of his upcoming IMRT with Dr. Baruch Gouty.  Prostate Gold fiducial Marker Placement Procedure   Informed consent was obtained after discussing risks/benefits of the procedure.  A time out was performed to ensure correct patient identity.  Pre-Procedure: - Gentamicin given prophylactically - PO Levaquin 500 mg also given today  Procedure: - Lidocaine jelly was administered per rectum - Rectal ultrasound probe was placed without difficulty and the prostate visualized - Prostatic block performed with 10 mL 1% Xylocaine - 3 fiducial gold seed markers placed, one at right base, one at left base, one at apex of prostate gland under transrectal ultrasound guidance  Post-Procedure: - Patient tolerated the procedure well - He was counseled to seek immediate medical attention if experiences any severe pain, significant bleeding, or fevers -Radiation oncology recommended ADT x6 months.  We discussed the most common side effects of ADT including tiredness, fatigue, hot flashes, decreased libido and erectile dysfunction.  He received a 56-monthdepot Eligard injection    SJohn Giovanni MD

## 2022-07-18 ENCOUNTER — Ambulatory Visit
Admission: RE | Admit: 2022-07-18 | Discharge: 2022-07-18 | Disposition: A | Discharge status: Home / Self Care | Payer: PPO | Source: Ambulatory Visit | Attending: Radiation Oncology | Admitting: Radiation Oncology

## 2022-07-18 DIAGNOSIS — Z191 Hormone sensitive malignancy status: Secondary | ICD-10-CM | POA: Diagnosis not present

## 2022-07-18 DIAGNOSIS — C61 Malignant neoplasm of prostate: Secondary | ICD-10-CM | POA: Diagnosis not present

## 2022-07-18 DIAGNOSIS — Z510 Encounter for antineoplastic radiation therapy: Secondary | ICD-10-CM | POA: Diagnosis not present

## 2022-07-18 DIAGNOSIS — Z87891 Personal history of nicotine dependence: Secondary | ICD-10-CM | POA: Diagnosis not present

## 2022-07-21 ENCOUNTER — Ambulatory Visit (INDEPENDENT_AMBULATORY_CARE_PROVIDER_SITE_OTHER): Payer: PPO

## 2022-07-21 DIAGNOSIS — H5213 Myopia, bilateral: Secondary | ICD-10-CM | POA: Diagnosis not present

## 2022-07-21 DIAGNOSIS — H52223 Regular astigmatism, bilateral: Secondary | ICD-10-CM | POA: Diagnosis not present

## 2022-07-21 DIAGNOSIS — E119 Type 2 diabetes mellitus without complications: Secondary | ICD-10-CM | POA: Diagnosis not present

## 2022-07-21 DIAGNOSIS — H524 Presbyopia: Secondary | ICD-10-CM | POA: Diagnosis not present

## 2022-07-21 DIAGNOSIS — H2513 Age-related nuclear cataract, bilateral: Secondary | ICD-10-CM | POA: Diagnosis not present

## 2022-07-21 DIAGNOSIS — Z7984 Long term (current) use of oral hypoglycemic drugs: Secondary | ICD-10-CM | POA: Diagnosis not present

## 2022-07-21 DIAGNOSIS — R55 Syncope and collapse: Secondary | ICD-10-CM

## 2022-07-22 ENCOUNTER — Other Ambulatory Visit: Payer: Self-pay | Admitting: *Deleted

## 2022-07-22 DIAGNOSIS — C61 Malignant neoplasm of prostate: Secondary | ICD-10-CM

## 2022-07-25 DIAGNOSIS — Z191 Hormone sensitive malignancy status: Secondary | ICD-10-CM | POA: Diagnosis not present

## 2022-07-25 DIAGNOSIS — Z87891 Personal history of nicotine dependence: Secondary | ICD-10-CM | POA: Insufficient documentation

## 2022-07-25 DIAGNOSIS — Z510 Encounter for antineoplastic radiation therapy: Secondary | ICD-10-CM | POA: Diagnosis not present

## 2022-07-25 DIAGNOSIS — C61 Malignant neoplasm of prostate: Secondary | ICD-10-CM | POA: Diagnosis not present

## 2022-07-25 LAB — CUP PACEART REMOTE DEVICE CHECK
Date Time Interrogation Session: 20230807152415
Implantable Pulse Generator Implant Date: 20230531
Pulse Gen Serial Number: 183770

## 2022-07-26 ENCOUNTER — Ambulatory Visit
Admission: RE | Admit: 2022-07-26 | Discharge: 2022-07-26 | Disposition: A | Discharge status: Home / Self Care | Payer: PPO | Source: Ambulatory Visit | Attending: Radiation Oncology | Admitting: Radiation Oncology

## 2022-07-27 ENCOUNTER — Other Ambulatory Visit: Payer: Self-pay

## 2022-07-27 ENCOUNTER — Ambulatory Visit
Admission: RE | Admit: 2022-07-27 | Discharge: 2022-07-27 | Disposition: A | Discharge status: Home / Self Care | Payer: PPO | Source: Ambulatory Visit | Attending: Radiation Oncology | Admitting: Radiation Oncology

## 2022-07-27 DIAGNOSIS — Z510 Encounter for antineoplastic radiation therapy: Secondary | ICD-10-CM | POA: Diagnosis not present

## 2022-07-27 DIAGNOSIS — Z191 Hormone sensitive malignancy status: Secondary | ICD-10-CM | POA: Diagnosis not present

## 2022-07-27 DIAGNOSIS — C61 Malignant neoplasm of prostate: Secondary | ICD-10-CM | POA: Diagnosis not present

## 2022-07-27 LAB — RAD ONC ARIA SESSION SUMMARY
Course Elapsed Days: 0
Plan Fractions Treated to Date: 1
Plan Prescribed Dose Per Fraction: 2 Gy
Plan Total Fractions Prescribed: 40
Plan Total Prescribed Dose: 80 Gy
Reference Point Dosage Given to Date: 2 Gy
Reference Point Session Dosage Given: 2 Gy
Session Number: 1

## 2022-07-28 ENCOUNTER — Ambulatory Visit
Admission: RE | Admit: 2022-07-28 | Discharge: 2022-07-28 | Disposition: A | Discharge status: Home / Self Care | Payer: PPO | Source: Ambulatory Visit | Attending: Radiation Oncology | Admitting: Radiation Oncology

## 2022-07-28 ENCOUNTER — Other Ambulatory Visit: Payer: Self-pay

## 2022-07-28 DIAGNOSIS — C61 Malignant neoplasm of prostate: Secondary | ICD-10-CM | POA: Diagnosis not present

## 2022-07-28 DIAGNOSIS — Z191 Hormone sensitive malignancy status: Secondary | ICD-10-CM | POA: Diagnosis not present

## 2022-07-28 DIAGNOSIS — Z510 Encounter for antineoplastic radiation therapy: Secondary | ICD-10-CM | POA: Diagnosis not present

## 2022-07-28 LAB — RAD ONC ARIA SESSION SUMMARY
Course Elapsed Days: 1
Plan Fractions Treated to Date: 2
Plan Prescribed Dose Per Fraction: 2 Gy
Plan Total Fractions Prescribed: 40
Plan Total Prescribed Dose: 80 Gy
Reference Point Dosage Given to Date: 4 Gy
Reference Point Session Dosage Given: 2 Gy
Session Number: 2

## 2022-07-29 ENCOUNTER — Ambulatory Visit
Admission: RE | Admit: 2022-07-29 | Discharge: 2022-07-29 | Disposition: A | Discharge status: Home / Self Care | Payer: PPO | Source: Ambulatory Visit | Attending: Radiation Oncology | Admitting: Radiation Oncology

## 2022-07-29 ENCOUNTER — Other Ambulatory Visit: Payer: Self-pay

## 2022-07-29 DIAGNOSIS — C61 Malignant neoplasm of prostate: Secondary | ICD-10-CM | POA: Diagnosis not present

## 2022-07-29 DIAGNOSIS — Z510 Encounter for antineoplastic radiation therapy: Secondary | ICD-10-CM | POA: Diagnosis not present

## 2022-07-29 DIAGNOSIS — Z191 Hormone sensitive malignancy status: Secondary | ICD-10-CM | POA: Diagnosis not present

## 2022-07-29 LAB — RAD ONC ARIA SESSION SUMMARY
Course Elapsed Days: 2
Plan Fractions Treated to Date: 3
Plan Prescribed Dose Per Fraction: 2 Gy
Plan Total Fractions Prescribed: 40
Plan Total Prescribed Dose: 80 Gy
Reference Point Dosage Given to Date: 6 Gy
Reference Point Session Dosage Given: 2 Gy
Session Number: 3

## 2022-08-01 ENCOUNTER — Other Ambulatory Visit: Payer: Self-pay

## 2022-08-01 ENCOUNTER — Ambulatory Visit
Admission: RE | Admit: 2022-08-01 | Discharge: 2022-08-01 | Disposition: A | Discharge status: Home / Self Care | Payer: PPO | Source: Ambulatory Visit | Attending: Radiation Oncology | Admitting: Radiation Oncology

## 2022-08-01 DIAGNOSIS — Z510 Encounter for antineoplastic radiation therapy: Secondary | ICD-10-CM | POA: Diagnosis not present

## 2022-08-01 DIAGNOSIS — C61 Malignant neoplasm of prostate: Secondary | ICD-10-CM | POA: Diagnosis not present

## 2022-08-01 DIAGNOSIS — Z191 Hormone sensitive malignancy status: Secondary | ICD-10-CM | POA: Diagnosis not present

## 2022-08-01 LAB — RAD ONC ARIA SESSION SUMMARY
Course Elapsed Days: 5
Plan Fractions Treated to Date: 4
Plan Prescribed Dose Per Fraction: 2 Gy
Plan Total Fractions Prescribed: 40
Plan Total Prescribed Dose: 80 Gy
Reference Point Dosage Given to Date: 8 Gy
Reference Point Session Dosage Given: 2 Gy
Session Number: 4

## 2022-08-02 ENCOUNTER — Ambulatory Visit
Admission: RE | Admit: 2022-08-02 | Discharge: 2022-08-02 | Disposition: A | Discharge status: Home / Self Care | Payer: PPO | Source: Ambulatory Visit | Attending: Radiation Oncology | Admitting: Radiation Oncology

## 2022-08-02 ENCOUNTER — Other Ambulatory Visit: Payer: Self-pay

## 2022-08-02 DIAGNOSIS — Z191 Hormone sensitive malignancy status: Secondary | ICD-10-CM | POA: Diagnosis not present

## 2022-08-02 DIAGNOSIS — Z510 Encounter for antineoplastic radiation therapy: Secondary | ICD-10-CM | POA: Diagnosis not present

## 2022-08-02 DIAGNOSIS — C61 Malignant neoplasm of prostate: Secondary | ICD-10-CM | POA: Diagnosis not present

## 2022-08-02 LAB — RAD ONC ARIA SESSION SUMMARY
Course Elapsed Days: 6
Plan Fractions Treated to Date: 5
Plan Prescribed Dose Per Fraction: 2 Gy
Plan Total Fractions Prescribed: 40
Plan Total Prescribed Dose: 80 Gy
Reference Point Dosage Given to Date: 10 Gy
Reference Point Session Dosage Given: 2 Gy
Session Number: 5

## 2022-08-03 ENCOUNTER — Other Ambulatory Visit: Payer: Self-pay

## 2022-08-03 ENCOUNTER — Ambulatory Visit
Admission: RE | Admit: 2022-08-03 | Discharge: 2022-08-03 | Disposition: A | Discharge status: Home / Self Care | Payer: PPO | Source: Ambulatory Visit | Attending: Radiation Oncology | Admitting: Radiation Oncology

## 2022-08-03 DIAGNOSIS — Z510 Encounter for antineoplastic radiation therapy: Secondary | ICD-10-CM | POA: Diagnosis not present

## 2022-08-03 DIAGNOSIS — C61 Malignant neoplasm of prostate: Secondary | ICD-10-CM | POA: Diagnosis not present

## 2022-08-03 DIAGNOSIS — Z191 Hormone sensitive malignancy status: Secondary | ICD-10-CM | POA: Diagnosis not present

## 2022-08-03 LAB — RAD ONC ARIA SESSION SUMMARY
Course Elapsed Days: 7
Plan Fractions Treated to Date: 6
Plan Prescribed Dose Per Fraction: 2 Gy
Plan Total Fractions Prescribed: 40
Plan Total Prescribed Dose: 80 Gy
Reference Point Dosage Given to Date: 12 Gy
Reference Point Session Dosage Given: 2 Gy
Session Number: 6

## 2022-08-04 ENCOUNTER — Ambulatory Visit
Admission: RE | Admit: 2022-08-04 | Discharge: 2022-08-04 | Disposition: A | Discharge status: Home / Self Care | Payer: PPO | Source: Ambulatory Visit | Attending: Radiation Oncology | Admitting: Radiation Oncology

## 2022-08-04 ENCOUNTER — Other Ambulatory Visit: Payer: Self-pay

## 2022-08-04 DIAGNOSIS — C61 Malignant neoplasm of prostate: Secondary | ICD-10-CM | POA: Diagnosis not present

## 2022-08-04 DIAGNOSIS — Z191 Hormone sensitive malignancy status: Secondary | ICD-10-CM | POA: Diagnosis not present

## 2022-08-04 DIAGNOSIS — Z510 Encounter for antineoplastic radiation therapy: Secondary | ICD-10-CM | POA: Diagnosis not present

## 2022-08-04 LAB — RAD ONC ARIA SESSION SUMMARY
Course Elapsed Days: 8
Plan Fractions Treated to Date: 7
Plan Prescribed Dose Per Fraction: 2 Gy
Plan Total Fractions Prescribed: 40
Plan Total Prescribed Dose: 80 Gy
Reference Point Dosage Given to Date: 14 Gy
Reference Point Session Dosage Given: 2 Gy
Session Number: 7

## 2022-08-05 ENCOUNTER — Other Ambulatory Visit: Payer: Self-pay

## 2022-08-05 ENCOUNTER — Ambulatory Visit
Admission: RE | Admit: 2022-08-05 | Discharge: 2022-08-05 | Disposition: A | Discharge status: Home / Self Care | Payer: PPO | Source: Ambulatory Visit | Attending: Radiation Oncology | Admitting: Radiation Oncology

## 2022-08-05 DIAGNOSIS — Z510 Encounter for antineoplastic radiation therapy: Secondary | ICD-10-CM | POA: Diagnosis not present

## 2022-08-05 DIAGNOSIS — C61 Malignant neoplasm of prostate: Secondary | ICD-10-CM | POA: Diagnosis not present

## 2022-08-05 DIAGNOSIS — Z191 Hormone sensitive malignancy status: Secondary | ICD-10-CM | POA: Diagnosis not present

## 2022-08-05 LAB — RAD ONC ARIA SESSION SUMMARY
Course Elapsed Days: 9
Plan Fractions Treated to Date: 8
Plan Prescribed Dose Per Fraction: 2 Gy
Plan Total Fractions Prescribed: 40
Plan Total Prescribed Dose: 80 Gy
Reference Point Dosage Given to Date: 16 Gy
Reference Point Session Dosage Given: 2 Gy
Session Number: 8

## 2022-08-08 ENCOUNTER — Other Ambulatory Visit: Payer: Self-pay

## 2022-08-08 ENCOUNTER — Ambulatory Visit
Admission: RE | Admit: 2022-08-08 | Discharge: 2022-08-08 | Disposition: A | Discharge status: Home / Self Care | Payer: PPO | Source: Ambulatory Visit | Attending: Radiation Oncology | Admitting: Radiation Oncology

## 2022-08-08 DIAGNOSIS — Z510 Encounter for antineoplastic radiation therapy: Secondary | ICD-10-CM | POA: Diagnosis not present

## 2022-08-08 DIAGNOSIS — C61 Malignant neoplasm of prostate: Secondary | ICD-10-CM | POA: Diagnosis not present

## 2022-08-08 DIAGNOSIS — Z191 Hormone sensitive malignancy status: Secondary | ICD-10-CM | POA: Diagnosis not present

## 2022-08-08 LAB — RAD ONC ARIA SESSION SUMMARY
Course Elapsed Days: 12
Plan Fractions Treated to Date: 9
Plan Prescribed Dose Per Fraction: 2 Gy
Plan Total Fractions Prescribed: 40
Plan Total Prescribed Dose: 80 Gy
Reference Point Dosage Given to Date: 18 Gy
Reference Point Session Dosage Given: 2 Gy
Session Number: 9

## 2022-08-09 ENCOUNTER — Other Ambulatory Visit: Payer: Self-pay | Admitting: *Deleted

## 2022-08-09 ENCOUNTER — Other Ambulatory Visit: Payer: Self-pay

## 2022-08-09 ENCOUNTER — Ambulatory Visit
Admission: RE | Admit: 2022-08-09 | Discharge: 2022-08-09 | Disposition: A | Discharge status: Home / Self Care | Payer: PPO | Source: Ambulatory Visit | Attending: Radiation Oncology | Admitting: Radiation Oncology

## 2022-08-09 ENCOUNTER — Inpatient Hospital Stay: Payer: PPO

## 2022-08-09 DIAGNOSIS — Z191 Hormone sensitive malignancy status: Secondary | ICD-10-CM | POA: Diagnosis not present

## 2022-08-09 DIAGNOSIS — C61 Malignant neoplasm of prostate: Secondary | ICD-10-CM | POA: Insufficient documentation

## 2022-08-09 DIAGNOSIS — Z510 Encounter for antineoplastic radiation therapy: Secondary | ICD-10-CM | POA: Diagnosis not present

## 2022-08-09 LAB — RAD ONC ARIA SESSION SUMMARY
Course Elapsed Days: 13
Plan Fractions Treated to Date: 10
Plan Prescribed Dose Per Fraction: 2 Gy
Plan Total Fractions Prescribed: 40
Plan Total Prescribed Dose: 80 Gy
Reference Point Dosage Given to Date: 20 Gy
Reference Point Session Dosage Given: 2 Gy
Session Number: 10

## 2022-08-09 LAB — CBC
HCT: 36.4 % — ABNORMAL LOW (ref 39.0–52.0)
Hemoglobin: 12.9 g/dL — ABNORMAL LOW (ref 13.0–17.0)
MCH: 31.6 pg (ref 26.0–34.0)
MCHC: 35.4 g/dL (ref 30.0–36.0)
MCV: 89.2 fL (ref 80.0–100.0)
Platelets: 129 10*3/uL — ABNORMAL LOW (ref 150–400)
RBC: 4.08 MIL/uL — ABNORMAL LOW (ref 4.22–5.81)
RDW: 13.2 % (ref 11.5–15.5)
WBC: 4.4 10*3/uL (ref 4.0–10.5)
nRBC: 0 % (ref 0.0–0.2)

## 2022-08-09 MED ORDER — TAMSULOSIN HCL 0.4 MG PO CAPS: 0.4000 mg | ORAL_CAPSULE | Freq: Every day | ORAL | 1 refills | Status: DC

## 2022-08-10 ENCOUNTER — Other Ambulatory Visit: Payer: Self-pay

## 2022-08-10 ENCOUNTER — Ambulatory Visit
Admission: RE | Admit: 2022-08-10 | Discharge: 2022-08-10 | Disposition: A | Discharge status: Home / Self Care | Payer: PPO | Source: Ambulatory Visit | Attending: Radiation Oncology | Admitting: Radiation Oncology

## 2022-08-10 DIAGNOSIS — Z191 Hormone sensitive malignancy status: Secondary | ICD-10-CM | POA: Diagnosis not present

## 2022-08-10 DIAGNOSIS — C61 Malignant neoplasm of prostate: Secondary | ICD-10-CM | POA: Diagnosis not present

## 2022-08-10 DIAGNOSIS — Z510 Encounter for antineoplastic radiation therapy: Secondary | ICD-10-CM | POA: Diagnosis not present

## 2022-08-10 LAB — RAD ONC ARIA SESSION SUMMARY
Course Elapsed Days: 14
Plan Fractions Treated to Date: 11
Plan Prescribed Dose Per Fraction: 2 Gy
Plan Total Fractions Prescribed: 40
Plan Total Prescribed Dose: 80 Gy
Reference Point Dosage Given to Date: 22 Gy
Reference Point Session Dosage Given: 2 Gy
Session Number: 11

## 2022-08-11 ENCOUNTER — Other Ambulatory Visit: Payer: Self-pay

## 2022-08-11 ENCOUNTER — Ambulatory Visit
Admission: RE | Admit: 2022-08-11 | Discharge: 2022-08-11 | Disposition: A | Discharge status: Home / Self Care | Payer: PPO | Source: Ambulatory Visit | Attending: Radiation Oncology | Admitting: Radiation Oncology

## 2022-08-11 DIAGNOSIS — Z191 Hormone sensitive malignancy status: Secondary | ICD-10-CM | POA: Diagnosis not present

## 2022-08-11 DIAGNOSIS — C61 Malignant neoplasm of prostate: Secondary | ICD-10-CM | POA: Diagnosis not present

## 2022-08-11 DIAGNOSIS — Z510 Encounter for antineoplastic radiation therapy: Secondary | ICD-10-CM | POA: Diagnosis not present

## 2022-08-11 LAB — RAD ONC ARIA SESSION SUMMARY
Course Elapsed Days: 15
Plan Fractions Treated to Date: 12
Plan Prescribed Dose Per Fraction: 2 Gy
Plan Total Fractions Prescribed: 40
Plan Total Prescribed Dose: 80 Gy
Reference Point Dosage Given to Date: 24 Gy
Reference Point Session Dosage Given: 2 Gy
Session Number: 12

## 2022-08-12 ENCOUNTER — Other Ambulatory Visit: Payer: Self-pay

## 2022-08-12 ENCOUNTER — Ambulatory Visit
Admission: RE | Admit: 2022-08-12 | Discharge: 2022-08-12 | Disposition: A | Discharge status: Home / Self Care | Payer: PPO | Source: Ambulatory Visit | Attending: Radiation Oncology | Admitting: Radiation Oncology

## 2022-08-12 DIAGNOSIS — C61 Malignant neoplasm of prostate: Secondary | ICD-10-CM | POA: Diagnosis not present

## 2022-08-12 DIAGNOSIS — Z510 Encounter for antineoplastic radiation therapy: Secondary | ICD-10-CM | POA: Diagnosis not present

## 2022-08-12 DIAGNOSIS — Z191 Hormone sensitive malignancy status: Secondary | ICD-10-CM | POA: Diagnosis not present

## 2022-08-12 LAB — RAD ONC ARIA SESSION SUMMARY
Course Elapsed Days: 16
Plan Fractions Treated to Date: 13
Plan Prescribed Dose Per Fraction: 2 Gy
Plan Total Fractions Prescribed: 40
Plan Total Prescribed Dose: 80 Gy
Reference Point Dosage Given to Date: 26 Gy
Reference Point Session Dosage Given: 2 Gy
Session Number: 13

## 2022-08-12 NOTE — Progress Notes (Signed)
Carelink Summary Report / Loop Recorder 

## 2022-08-15 ENCOUNTER — Ambulatory Visit
Admission: RE | Admit: 2022-08-15 | Discharge: 2022-08-15 | Disposition: A | Discharge status: Home / Self Care | Payer: PPO | Source: Ambulatory Visit | Attending: Radiation Oncology | Admitting: Radiation Oncology

## 2022-08-15 ENCOUNTER — Other Ambulatory Visit: Payer: Self-pay

## 2022-08-15 DIAGNOSIS — Z191 Hormone sensitive malignancy status: Secondary | ICD-10-CM | POA: Diagnosis not present

## 2022-08-15 DIAGNOSIS — Z510 Encounter for antineoplastic radiation therapy: Secondary | ICD-10-CM | POA: Diagnosis not present

## 2022-08-15 DIAGNOSIS — C61 Malignant neoplasm of prostate: Secondary | ICD-10-CM | POA: Diagnosis not present

## 2022-08-15 LAB — RAD ONC ARIA SESSION SUMMARY
Course Elapsed Days: 19
Plan Fractions Treated to Date: 14
Plan Prescribed Dose Per Fraction: 2 Gy
Plan Total Fractions Prescribed: 40
Plan Total Prescribed Dose: 80 Gy
Reference Point Dosage Given to Date: 28 Gy
Reference Point Session Dosage Given: 2 Gy
Session Number: 14

## 2022-08-16 ENCOUNTER — Other Ambulatory Visit: Payer: Self-pay

## 2022-08-16 ENCOUNTER — Ambulatory Visit
Admission: RE | Admit: 2022-08-16 | Discharge: 2022-08-16 | Disposition: A | Discharge status: Home / Self Care | Payer: PPO | Source: Ambulatory Visit | Attending: Radiation Oncology | Admitting: Radiation Oncology

## 2022-08-16 DIAGNOSIS — Z191 Hormone sensitive malignancy status: Secondary | ICD-10-CM | POA: Diagnosis not present

## 2022-08-16 DIAGNOSIS — Z510 Encounter for antineoplastic radiation therapy: Secondary | ICD-10-CM | POA: Diagnosis not present

## 2022-08-16 DIAGNOSIS — C61 Malignant neoplasm of prostate: Secondary | ICD-10-CM | POA: Diagnosis not present

## 2022-08-16 LAB — RAD ONC ARIA SESSION SUMMARY
Course Elapsed Days: 20
Plan Fractions Treated to Date: 15
Plan Prescribed Dose Per Fraction: 2 Gy
Plan Total Fractions Prescribed: 40
Plan Total Prescribed Dose: 80 Gy
Reference Point Dosage Given to Date: 30 Gy
Reference Point Session Dosage Given: 2 Gy
Session Number: 15

## 2022-08-17 ENCOUNTER — Other Ambulatory Visit: Payer: Self-pay

## 2022-08-17 ENCOUNTER — Ambulatory Visit
Admission: RE | Admit: 2022-08-17 | Discharge: 2022-08-17 | Disposition: A | Discharge status: Home / Self Care | Payer: PPO | Source: Ambulatory Visit | Attending: Radiation Oncology | Admitting: Radiation Oncology

## 2022-08-17 DIAGNOSIS — G4733 Obstructive sleep apnea (adult) (pediatric): Secondary | ICD-10-CM | POA: Diagnosis not present

## 2022-08-17 DIAGNOSIS — Z510 Encounter for antineoplastic radiation therapy: Secondary | ICD-10-CM | POA: Diagnosis not present

## 2022-08-17 DIAGNOSIS — Z191 Hormone sensitive malignancy status: Secondary | ICD-10-CM | POA: Diagnosis not present

## 2022-08-17 DIAGNOSIS — C61 Malignant neoplasm of prostate: Secondary | ICD-10-CM | POA: Diagnosis not present

## 2022-08-17 LAB — RAD ONC ARIA SESSION SUMMARY
Course Elapsed Days: 21
Plan Fractions Treated to Date: 16
Plan Prescribed Dose Per Fraction: 2 Gy
Plan Total Fractions Prescribed: 40
Plan Total Prescribed Dose: 80 Gy
Reference Point Dosage Given to Date: 32 Gy
Reference Point Session Dosage Given: 2 Gy
Session Number: 16

## 2022-08-18 ENCOUNTER — Other Ambulatory Visit: Payer: Self-pay

## 2022-08-18 ENCOUNTER — Ambulatory Visit
Admission: RE | Admit: 2022-08-18 | Discharge: 2022-08-18 | Disposition: A | Discharge status: Home / Self Care | Payer: PPO | Source: Ambulatory Visit | Attending: Radiation Oncology | Admitting: Radiation Oncology

## 2022-08-18 DIAGNOSIS — C61 Malignant neoplasm of prostate: Secondary | ICD-10-CM | POA: Diagnosis not present

## 2022-08-18 DIAGNOSIS — Z191 Hormone sensitive malignancy status: Secondary | ICD-10-CM | POA: Diagnosis not present

## 2022-08-18 DIAGNOSIS — Z510 Encounter for antineoplastic radiation therapy: Secondary | ICD-10-CM | POA: Diagnosis not present

## 2022-08-18 LAB — RAD ONC ARIA SESSION SUMMARY
Course Elapsed Days: 22
Plan Fractions Treated to Date: 17
Plan Prescribed Dose Per Fraction: 2 Gy
Plan Total Fractions Prescribed: 40
Plan Total Prescribed Dose: 80 Gy
Reference Point Dosage Given to Date: 34 Gy
Reference Point Session Dosage Given: 2 Gy
Session Number: 17

## 2022-08-19 ENCOUNTER — Other Ambulatory Visit: Payer: Self-pay

## 2022-08-19 ENCOUNTER — Ambulatory Visit
Admission: RE | Admit: 2022-08-19 | Discharge: 2022-08-19 | Disposition: A | Discharge status: Home / Self Care | Payer: PPO | Source: Ambulatory Visit | Attending: Radiation Oncology | Admitting: Radiation Oncology

## 2022-08-19 DIAGNOSIS — Z191 Hormone sensitive malignancy status: Secondary | ICD-10-CM | POA: Diagnosis not present

## 2022-08-19 DIAGNOSIS — Z510 Encounter for antineoplastic radiation therapy: Secondary | ICD-10-CM | POA: Insufficient documentation

## 2022-08-19 DIAGNOSIS — C61 Malignant neoplasm of prostate: Secondary | ICD-10-CM | POA: Diagnosis not present

## 2022-08-19 DIAGNOSIS — Z87891 Personal history of nicotine dependence: Secondary | ICD-10-CM | POA: Insufficient documentation

## 2022-08-19 LAB — RAD ONC ARIA SESSION SUMMARY
Course Elapsed Days: 23
Plan Fractions Treated to Date: 18
Plan Prescribed Dose Per Fraction: 2 Gy
Plan Total Fractions Prescribed: 40
Plan Total Prescribed Dose: 80 Gy
Reference Point Dosage Given to Date: 36 Gy
Reference Point Session Dosage Given: 2 Gy
Session Number: 18

## 2022-08-23 ENCOUNTER — Inpatient Hospital Stay: Payer: PPO

## 2022-08-23 ENCOUNTER — Ambulatory Visit
Admission: RE | Admit: 2022-08-23 | Discharge: 2022-08-23 | Disposition: A | Discharge status: Home / Self Care | Payer: PPO | Source: Ambulatory Visit | Attending: Radiation Oncology | Admitting: Radiation Oncology

## 2022-08-23 ENCOUNTER — Other Ambulatory Visit: Payer: Self-pay

## 2022-08-23 DIAGNOSIS — I2510 Atherosclerotic heart disease of native coronary artery without angina pectoris: Secondary | ICD-10-CM | POA: Diagnosis present

## 2022-08-23 DIAGNOSIS — Z833 Family history of diabetes mellitus: Secondary | ICD-10-CM | POA: Diagnosis not present

## 2022-08-23 DIAGNOSIS — K219 Gastro-esophageal reflux disease without esophagitis: Secondary | ICD-10-CM | POA: Diagnosis present

## 2022-08-23 DIAGNOSIS — I3139 Other pericardial effusion (noninflammatory): Secondary | ICD-10-CM | POA: Diagnosis not present

## 2022-08-23 DIAGNOSIS — I719 Aortic aneurysm of unspecified site, without rupture: Secondary | ICD-10-CM | POA: Diagnosis present

## 2022-08-23 DIAGNOSIS — I10 Essential (primary) hypertension: Secondary | ICD-10-CM | POA: Diagnosis not present

## 2022-08-23 DIAGNOSIS — J439 Emphysema, unspecified: Secondary | ICD-10-CM | POA: Diagnosis present

## 2022-08-23 DIAGNOSIS — Z510 Encounter for antineoplastic radiation therapy: Secondary | ICD-10-CM | POA: Diagnosis not present

## 2022-08-23 DIAGNOSIS — I959 Hypotension, unspecified: Secondary | ICD-10-CM | POA: Diagnosis not present

## 2022-08-23 DIAGNOSIS — C61 Malignant neoplasm of prostate: Secondary | ICD-10-CM | POA: Diagnosis present

## 2022-08-23 DIAGNOSIS — E7800 Pure hypercholesterolemia, unspecified: Secondary | ICD-10-CM | POA: Diagnosis present

## 2022-08-23 DIAGNOSIS — G4733 Obstructive sleep apnea (adult) (pediatric): Secondary | ICD-10-CM | POA: Diagnosis present

## 2022-08-23 DIAGNOSIS — I480 Paroxysmal atrial fibrillation: Secondary | ICD-10-CM | POA: Diagnosis present

## 2022-08-23 DIAGNOSIS — Z191 Hormone sensitive malignancy status: Secondary | ICD-10-CM | POA: Diagnosis not present

## 2022-08-23 DIAGNOSIS — J309 Allergic rhinitis, unspecified: Secondary | ICD-10-CM | POA: Diagnosis present

## 2022-08-23 DIAGNOSIS — Z85828 Personal history of other malignant neoplasm of skin: Secondary | ICD-10-CM | POA: Diagnosis not present

## 2022-08-23 DIAGNOSIS — R000 Tachycardia, unspecified: Secondary | ICD-10-CM | POA: Diagnosis not present

## 2022-08-23 DIAGNOSIS — Z79899 Other long term (current) drug therapy: Secondary | ICD-10-CM | POA: Diagnosis not present

## 2022-08-23 DIAGNOSIS — R55 Syncope and collapse: Secondary | ICD-10-CM | POA: Diagnosis present

## 2022-08-23 DIAGNOSIS — D696 Thrombocytopenia, unspecified: Secondary | ICD-10-CM | POA: Diagnosis present

## 2022-08-23 DIAGNOSIS — I471 Supraventricular tachycardia: Secondary | ICD-10-CM | POA: Diagnosis present

## 2022-08-23 DIAGNOSIS — I4729 Other ventricular tachycardia: Secondary | ICD-10-CM | POA: Diagnosis present

## 2022-08-23 DIAGNOSIS — I472 Ventricular tachycardia, unspecified: Secondary | ICD-10-CM | POA: Diagnosis present

## 2022-08-23 DIAGNOSIS — Z87442 Personal history of urinary calculi: Secondary | ICD-10-CM | POA: Diagnosis not present

## 2022-08-23 DIAGNOSIS — E1169 Type 2 diabetes mellitus with other specified complication: Secondary | ICD-10-CM | POA: Diagnosis present

## 2022-08-23 DIAGNOSIS — Z8719 Personal history of other diseases of the digestive system: Secondary | ICD-10-CM | POA: Diagnosis not present

## 2022-08-23 DIAGNOSIS — I152 Hypertension secondary to endocrine disorders: Secondary | ICD-10-CM | POA: Diagnosis present

## 2022-08-23 DIAGNOSIS — Z7902 Long term (current) use of antithrombotics/antiplatelets: Secondary | ICD-10-CM | POA: Diagnosis not present

## 2022-08-23 DIAGNOSIS — Z87891 Personal history of nicotine dependence: Secondary | ICD-10-CM | POA: Diagnosis not present

## 2022-08-23 LAB — RAD ONC ARIA SESSION SUMMARY
Course Elapsed Days: 27
Plan Fractions Treated to Date: 19
Plan Prescribed Dose Per Fraction: 2 Gy
Plan Total Fractions Prescribed: 40
Plan Total Prescribed Dose: 80 Gy
Reference Point Dosage Given to Date: 38 Gy
Reference Point Session Dosage Given: 2 Gy
Session Number: 19

## 2022-08-23 LAB — CBC
HCT: 34.8 % — ABNORMAL LOW (ref 39.0–52.0)
Hemoglobin: 12.2 g/dL — ABNORMAL LOW (ref 13.0–17.0)
MCH: 31.5 pg (ref 26.0–34.0)
MCHC: 35.1 g/dL (ref 30.0–36.0)
MCV: 89.9 fL (ref 80.0–100.0)
Platelets: 119 10*3/uL — ABNORMAL LOW (ref 150–400)
RBC: 3.87 MIL/uL — ABNORMAL LOW (ref 4.22–5.81)
RDW: 13.9 % (ref 11.5–15.5)
WBC: 4.1 10*3/uL (ref 4.0–10.5)
nRBC: 0 % (ref 0.0–0.2)

## 2022-08-24 ENCOUNTER — Ambulatory Visit
Admission: RE | Admit: 2022-08-24 | Discharge: 2022-08-24 | Disposition: A | Discharge status: Home / Self Care | Payer: PPO | Source: Ambulatory Visit | Attending: Radiation Oncology | Admitting: Radiation Oncology

## 2022-08-24 ENCOUNTER — Other Ambulatory Visit: Payer: Self-pay

## 2022-08-24 DIAGNOSIS — Z191 Hormone sensitive malignancy status: Secondary | ICD-10-CM | POA: Diagnosis not present

## 2022-08-24 DIAGNOSIS — Z510 Encounter for antineoplastic radiation therapy: Secondary | ICD-10-CM | POA: Diagnosis not present

## 2022-08-24 DIAGNOSIS — C61 Malignant neoplasm of prostate: Secondary | ICD-10-CM | POA: Diagnosis not present

## 2022-08-24 LAB — RAD ONC ARIA SESSION SUMMARY
Course Elapsed Days: 28
Plan Fractions Treated to Date: 20
Plan Prescribed Dose Per Fraction: 2 Gy
Plan Total Fractions Prescribed: 40
Plan Total Prescribed Dose: 80 Gy
Reference Point Dosage Given to Date: 40 Gy
Reference Point Session Dosage Given: 2 Gy
Session Number: 20

## 2022-08-25 ENCOUNTER — Other Ambulatory Visit: Payer: Self-pay

## 2022-08-25 ENCOUNTER — Emergency Department (HOSPITAL_COMMUNITY): Payer: PPO

## 2022-08-25 ENCOUNTER — Ambulatory Visit
Admission: RE | Admit: 2022-08-25 | Discharge: 2022-08-25 | Disposition: A | Discharge status: Home / Self Care | Payer: PPO | Source: Ambulatory Visit | Attending: Radiation Oncology | Admitting: Radiation Oncology

## 2022-08-25 ENCOUNTER — Telehealth: Payer: Self-pay

## 2022-08-25 ENCOUNTER — Inpatient Hospital Stay (HOSPITAL_COMMUNITY)
Admission: EM | Admit: 2022-08-25 | Discharge: 2022-08-27 | DRG: 287 | Disposition: A | Discharge status: Home / Self Care | Payer: PPO | Attending: Internal Medicine | Admitting: Internal Medicine

## 2022-08-25 DIAGNOSIS — Z7902 Long term (current) use of antithrombotics/antiplatelets: Secondary | ICD-10-CM

## 2022-08-25 DIAGNOSIS — Z79899 Other long term (current) drug therapy: Secondary | ICD-10-CM

## 2022-08-25 DIAGNOSIS — J309 Allergic rhinitis, unspecified: Secondary | ICD-10-CM | POA: Diagnosis present

## 2022-08-25 DIAGNOSIS — Z510 Encounter for antineoplastic radiation therapy: Secondary | ICD-10-CM | POA: Diagnosis not present

## 2022-08-25 DIAGNOSIS — Z8719 Personal history of other diseases of the digestive system: Secondary | ICD-10-CM

## 2022-08-25 DIAGNOSIS — Z955 Presence of coronary angioplasty implant and graft: Secondary | ICD-10-CM

## 2022-08-25 DIAGNOSIS — I480 Paroxysmal atrial fibrillation: Secondary | ICD-10-CM | POA: Diagnosis not present

## 2022-08-25 DIAGNOSIS — E7800 Pure hypercholesterolemia, unspecified: Secondary | ICD-10-CM | POA: Diagnosis present

## 2022-08-25 DIAGNOSIS — I10 Essential (primary) hypertension: Secondary | ICD-10-CM

## 2022-08-25 DIAGNOSIS — I471 Supraventricular tachycardia: Secondary | ICD-10-CM | POA: Diagnosis present

## 2022-08-25 DIAGNOSIS — I2510 Atherosclerotic heart disease of native coronary artery without angina pectoris: Secondary | ICD-10-CM | POA: Diagnosis not present

## 2022-08-25 DIAGNOSIS — Z9049 Acquired absence of other specified parts of digestive tract: Secondary | ICD-10-CM

## 2022-08-25 DIAGNOSIS — Z86010 Personal history of colonic polyps: Secondary | ICD-10-CM

## 2022-08-25 DIAGNOSIS — C61 Malignant neoplasm of prostate: Secondary | ICD-10-CM | POA: Diagnosis present

## 2022-08-25 DIAGNOSIS — I719 Aortic aneurysm of unspecified site, without rupture: Secondary | ICD-10-CM | POA: Diagnosis present

## 2022-08-25 DIAGNOSIS — Z85828 Personal history of other malignant neoplasm of skin: Secondary | ICD-10-CM

## 2022-08-25 DIAGNOSIS — I4729 Other ventricular tachycardia: Secondary | ICD-10-CM | POA: Diagnosis not present

## 2022-08-25 DIAGNOSIS — D696 Thrombocytopenia, unspecified: Secondary | ICD-10-CM | POA: Diagnosis present

## 2022-08-25 DIAGNOSIS — G4733 Obstructive sleep apnea (adult) (pediatric): Secondary | ICD-10-CM

## 2022-08-25 DIAGNOSIS — E1169 Type 2 diabetes mellitus with other specified complication: Secondary | ICD-10-CM | POA: Diagnosis present

## 2022-08-25 DIAGNOSIS — Z881 Allergy status to other antibiotic agents status: Secondary | ICD-10-CM

## 2022-08-25 DIAGNOSIS — Z7984 Long term (current) use of oral hypoglycemic drugs: Secondary | ICD-10-CM

## 2022-08-25 DIAGNOSIS — J439 Emphysema, unspecified: Secondary | ICD-10-CM | POA: Diagnosis present

## 2022-08-25 DIAGNOSIS — Z87891 Personal history of nicotine dependence: Secondary | ICD-10-CM

## 2022-08-25 DIAGNOSIS — I152 Hypertension secondary to endocrine disorders: Secondary | ICD-10-CM | POA: Diagnosis present

## 2022-08-25 DIAGNOSIS — Z833 Family history of diabetes mellitus: Secondary | ICD-10-CM

## 2022-08-25 DIAGNOSIS — K219 Gastro-esophageal reflux disease without esophagitis: Secondary | ICD-10-CM | POA: Diagnosis present

## 2022-08-25 DIAGNOSIS — Z87442 Personal history of urinary calculi: Secondary | ICD-10-CM

## 2022-08-25 DIAGNOSIS — R55 Syncope and collapse: Secondary | ICD-10-CM | POA: Diagnosis present

## 2022-08-25 DIAGNOSIS — Z191 Hormone sensitive malignancy status: Secondary | ICD-10-CM | POA: Diagnosis not present

## 2022-08-25 DIAGNOSIS — I959 Hypotension, unspecified: Secondary | ICD-10-CM | POA: Diagnosis not present

## 2022-08-25 DIAGNOSIS — E119 Type 2 diabetes mellitus without complications: Secondary | ICD-10-CM

## 2022-08-25 DIAGNOSIS — I472 Ventricular tachycardia, unspecified: Principal | ICD-10-CM | POA: Diagnosis present

## 2022-08-25 LAB — CBC WITH DIFFERENTIAL/PLATELET
Abs Immature Granulocytes: 0.02 10*3/uL (ref 0.00–0.07)
Basophils Absolute: 0 10*3/uL (ref 0.0–0.1)
Basophils Relative: 1 %
Eosinophils Absolute: 0 10*3/uL (ref 0.0–0.5)
Eosinophils Relative: 1 %
HCT: 37.8 % — ABNORMAL LOW (ref 39.0–52.0)
Hemoglobin: 13.3 g/dL (ref 13.0–17.0)
Immature Granulocytes: 1 %
Lymphocytes Relative: 10 %
Lymphs Abs: 0.4 10*3/uL — ABNORMAL LOW (ref 0.7–4.0)
MCH: 31.6 pg (ref 26.0–34.0)
MCHC: 35.2 g/dL (ref 30.0–36.0)
MCV: 89.8 fL (ref 80.0–100.0)
Monocytes Absolute: 0.4 10*3/uL (ref 0.1–1.0)
Monocytes Relative: 11 %
Neutro Abs: 3.1 10*3/uL (ref 1.7–7.7)
Neutrophils Relative %: 76 %
Platelets: 120 10*3/uL — ABNORMAL LOW (ref 150–400)
RBC: 4.21 MIL/uL — ABNORMAL LOW (ref 4.22–5.81)
RDW: 13.7 % (ref 11.5–15.5)
WBC: 3.9 10*3/uL — ABNORMAL LOW (ref 4.0–10.5)
nRBC: 0 % (ref 0.0–0.2)

## 2022-08-25 LAB — TSH: TSH: 1.525 u[IU]/mL (ref 0.350–4.500)

## 2022-08-25 LAB — RAD ONC ARIA SESSION SUMMARY
Course Elapsed Days: 29
Plan Fractions Treated to Date: 21
Plan Prescribed Dose Per Fraction: 2 Gy
Plan Total Fractions Prescribed: 40
Plan Total Prescribed Dose: 80 Gy
Reference Point Dosage Given to Date: 42 Gy
Reference Point Session Dosage Given: 2 Gy
Session Number: 21

## 2022-08-25 LAB — HEMOGLOBIN A1C
Hgb A1c MFr Bld: 6.5 % — ABNORMAL HIGH (ref 4.8–5.6)
Mean Plasma Glucose: 139.85 mg/dL

## 2022-08-25 LAB — MAGNESIUM: Magnesium: 1.1 mg/dL — ABNORMAL LOW (ref 1.7–2.4)

## 2022-08-25 LAB — TROPONIN I (HIGH SENSITIVITY): Troponin I (High Sensitivity): 6 ng/L (ref <18)

## 2022-08-25 LAB — BASIC METABOLIC PANEL
Anion gap: 11 (ref 5–15)
BUN: 11 mg/dL (ref 8–23)
CO2: 25 mmol/L (ref 22–32)
Calcium: 9.4 mg/dL (ref 8.9–10.3)
Chloride: 97 mmol/L — ABNORMAL LOW (ref 98–111)
Creatinine, Ser: 0.94 mg/dL (ref 0.61–1.24)
GFR, Estimated: >60 mL/min (ref >60)
Glucose, Bld: 220 mg/dL — ABNORMAL HIGH (ref 70–99)
Potassium: 3.9 mmol/L (ref 3.5–5.1)
Sodium: 133 mmol/L — ABNORMAL LOW (ref 135–145)

## 2022-08-25 LAB — GLUCOSE, CAPILLARY: Glucose-Capillary: 169 mg/dL — ABNORMAL HIGH (ref 70–99)

## 2022-08-25 MED ORDER — SIMVASTATIN 20 MG PO TABS
40.0000 mg | ORAL_TABLET | Freq: Every day | ORAL | Status: DC
  Administered 2022-08-25 – 2022-08-26 (×2): 40 mg via ORAL
  Filled 2022-08-25 (×2): qty 2

## 2022-08-25 MED ORDER — PANTOPRAZOLE SODIUM 40 MG PO TBEC
40.0000 mg | DELAYED_RELEASE_TABLET | Freq: Every day | ORAL | Status: DC
  Administered 2022-08-26 – 2022-08-27 (×2): 40 mg via ORAL
  Filled 2022-08-25 (×2): qty 1

## 2022-08-25 MED ORDER — MAGNESIUM SULFATE 2 GM/50ML IV SOLN
2.0000 g | Freq: Once | INTRAVENOUS | Status: AC
  Administered 2022-08-25: 2 g via INTRAVENOUS
  Filled 2022-08-25: qty 50

## 2022-08-25 MED ORDER — TAMSULOSIN HCL 0.4 MG PO CAPS
0.4000 mg | ORAL_CAPSULE | Freq: Every day | ORAL | Status: DC
  Administered 2022-08-26 – 2022-08-27 (×2): 0.4 mg via ORAL
  Filled 2022-08-25 (×2): qty 1

## 2022-08-25 MED ORDER — ACETAMINOPHEN 650 MG RE SUPP: 650.0000 mg | Freq: Four times a day (QID) | RECTAL | Status: DC | PRN

## 2022-08-25 MED ORDER — HYDRALAZINE HCL 20 MG/ML IJ SOLN: 10.0000 mg | Freq: Four times a day (QID) | INTRAMUSCULAR | Status: DC | PRN

## 2022-08-25 MED ORDER — SENNOSIDES-DOCUSATE SODIUM 8.6-50 MG PO TABS: 1.0000 | ORAL_TABLET | Freq: Every evening | ORAL | Status: DC | PRN

## 2022-08-25 MED ORDER — INSULIN ASPART 100 UNIT/ML IJ SOLN
0.0000 [IU] | Freq: Three times a day (TID) | INTRAMUSCULAR | Status: DC
  Administered 2022-08-26: 5 [IU] via SUBCUTANEOUS
  Administered 2022-08-26 – 2022-08-27 (×2): 2 [IU] via SUBCUTANEOUS
  Administered 2022-08-27: 3 [IU] via SUBCUTANEOUS

## 2022-08-25 MED ORDER — SODIUM CHLORIDE 0.9% FLUSH
3.0000 mL | Freq: Two times a day (BID) | INTRAVENOUS | Status: DC
  Administered 2022-08-25 – 2022-08-27 (×3): 3 mL via INTRAVENOUS

## 2022-08-25 MED ORDER — SODIUM CHLORIDE 0.9 % IV SOLN: INTRAVENOUS | Status: DC

## 2022-08-25 MED ORDER — METOPROLOL TARTRATE 12.5 MG HALF TABLET
12.5000 mg | ORAL_TABLET | Freq: Two times a day (BID) | ORAL | Status: DC
  Administered 2022-08-25 – 2022-08-27 (×4): 12.5 mg via ORAL
  Filled 2022-08-25 (×4): qty 1

## 2022-08-25 MED ORDER — ACETAMINOPHEN 325 MG PO TABS: 650.0000 mg | ORAL_TABLET | Freq: Four times a day (QID) | ORAL | Status: DC | PRN

## 2022-08-25 MED ORDER — LISINOPRIL 20 MG PO TABS
20.0000 mg | ORAL_TABLET | Freq: Two times a day (BID) | ORAL | Status: DC
  Administered 2022-08-25 – 2022-08-27 (×4): 20 mg via ORAL
  Filled 2022-08-25 (×4): qty 1

## 2022-08-25 MED ORDER — CLOPIDOGREL BISULFATE 75 MG PO TABS
75.0000 mg | ORAL_TABLET | Freq: Every day | ORAL | Status: DC
  Administered 2022-08-26 – 2022-08-27 (×2): 75 mg via ORAL
  Filled 2022-08-25 (×2): qty 1

## 2022-08-25 MED ORDER — ENOXAPARIN SODIUM 40 MG/0.4ML IJ SOSY
40.0000 mg | PREFILLED_SYRINGE | INTRAMUSCULAR | Status: DC
  Administered 2022-08-25 – 2022-08-26 (×2): 40 mg via SUBCUTANEOUS
  Filled 2022-08-25 (×2): qty 0.4

## 2022-08-25 NOTE — Assessment & Plan Note (Signed)
Continue home lisinopril and Lopressor.  HCTZ discontinued in setting of hypomagnesemia.

## 2022-08-25 NOTE — Assessment & Plan Note (Signed)
Supplemented, repeat level in AM.

## 2022-08-25 NOTE — Hospital Course (Signed)
IHAN PAT is a 76 y.o. male with medical history significant for CAD s/p DES RCA 2012, PAF not on anticoagulation, prostate cancer on active IMRT, T2DM, HTN, HLD, AAA, OSA on CPAP, recurrent syncope with loop recorder in place who is admitted with symptomatic NSVT in setting of hypomagnesemia.

## 2022-08-25 NOTE — Assessment & Plan Note (Signed)
On active IMRT.

## 2022-08-25 NOTE — Assessment & Plan Note (Signed)
Hold metformin and place on SSI.

## 2022-08-25 NOTE — Consult Note (Signed)
Cardiology Consultation   Patient ID: Bryan Strong MRN: [id number]; DOB: 17-Jun-1946  Admit date: 08/25/2022 Date of Consult: 08/25/2022  PCP:  Derinda Late, MD   [place] Providers Cardiologist:  None  Electrophysiologist:  Vickie Epley, MD       Patient Profile:   Bryan Strong is a 76 y.o. male with a hx of paroxysmal atrial fibrillation, CAD status post PCI, COPD, hyperlipidemia, PAD, OSA, T2DM, prostate cancer, multiple myeloma who is being seen 08/25/2022 for the evaluation of NSVT at the request of Dr. Sabra Heck.  History of Present Illness:   Bryan Strong is a 76 year old male with the above medical history who presents with NSVT.  He saw Dr. Quentin Ore for syncope on 05/05/2022.  Loop recorder was placed.  Patient reported yesterday brief episode of lightheadedness and near syncope, lasted few seconds.  He was found to have an 18 beat run of NSVT on loop recorder.  Dr. Quentin Ore was called and recommended patient come to ED.  In the ED, vital signs notable for BP 154/77, pulse 73, SPO2 100% on room air.  Labs notable for sodium 133, potassium 3.9, magnesium 1.1, WBC 3.9, hemoglobin 13.3, platelets 120, TSH 1.5.  EKG shows normal sinus rhythm, rate 100, no ST abnormalities.  Chest x-ray unremarkable.  Underwent RCA stenting in 2012.  Most recent [facility] 06/08/2017 showed 75% OM1 lesion, 90% ostial D1, patent RCA stent.  Medical management was recommended.  Echocardiogram 12/31/2021 showed normal biventricular function, no significant valvular disease.  Past Medical History:  Diagnosis Date   A-fib ([place])    Achalasia of esophagus    Allergic rhinitis    Anemia    Anginal pain ([place])    Aortic aneurysm ([place])    followed by Dr. Franchot Gallo   Coronary artery disease    Diabetes mellitus without complication ([place])    Dyspnea    Emphysema of lung ([facility])    GERD (gastroesophageal reflux disease)    Heart murmur    Hypercholesteremia    Hypertension    Kidney stones    Skin  cancer    Sleep apnea    CPAP   Wears dentures    full upper and lower    Past Surgical History:  Procedure Laterality Date   CHOLECYSTECTOMY     colon polyps     COLONOSCOPY WITH PROPOFOL N/A 09/18/2017   Procedure: COLONOSCOPY WITH PROPOFOL;  Surgeon: Manya Silvas, MD;  Location: [facility] ENDOSCOPY;  Service: Endoscopy;  Laterality: N/A;   CORONARY ANGIOPLASTY WITH STENT PLACEMENT  01/03/11   [facility]  Dr. Saralyn Pilar   ECTROPION REPAIR Bilateral 05/03/2016   Procedure: REPAIR OF ECTROPION BILATERAL LOWER EYELIDS;  Surgeon: Karle Starch, MD;  Location: [place];  Service: Ophthalmology;  Laterality: Bilateral;   ESOPHAGOGASTRODUODENOSCOPY (EGD) WITH PROPOFOL N/A 07/21/2017   Procedure: ESOPHAGOGASTRODUODENOSCOPY (EGD) WITH PROPOFOL;  Surgeon: Manya Silvas, MD;  Location: [facility] ENDOSCOPY;  Service: Endoscopy;  Laterality: N/A;   ESOPHAGOGASTRODUODENOSCOPY (EGD) WITH PROPOFOL Bilateral 10/18/2017   Procedure: ESOPHAGOGASTRODUODENOSCOPY (EGD) WITH PROPOFOL;  Surgeon: Manya Silvas, MD;  Location: [facility] ENDOSCOPY;  Service: Endoscopy;  Laterality: Bilateral;   HERNIA REPAIR     LEFT HEART CATH Left 06/08/2017   Procedure: Left Heart Cath and Coronary Angiography;  Surgeon: Yolonda Kida, MD;  Location: [place] CV LAB;  Service: Cardiovascular;  Laterality: Left;   LID LESION EXCISION Bilateral 05/03/2016   Procedure: ,EXCISION CYST RIGHT UPPER EYELID;  Surgeon: Karle Starch,  MD;  Location: [place];  Service: Ophthalmology;  Laterality: Bilateral;  Diabetic - oral meds CPAP   NASAL SEPTUM SURGERY  1967   SAVORY DILATION N/A 07/21/2017   Procedure: SAVORY DILATION;  Surgeon: Manya Silvas, MD;  Location: [facility] ENDOSCOPY;  Service: Endoscopy;  Laterality: N/A;     Inpatient Medications: Scheduled Meds:  Continuous Infusions:  magnesium sulfate bolus IVPB     PRN Meds:   Allergies:    Allergies  Allergen Reactions   Erythromycin Base Rash   Other  Rash    ERYTHROMYCIN OINTMENT "some nerve pill, caused HTN"    Social History:   Social History   Socioeconomic History   Marital status: Married    Spouse name: Not on file   Number of children: Not on file   Years of education: Not on file   Highest education level: Not on file  Occupational History   Not on file  Tobacco Use   Smoking status: Former    Packs/day: 1.00    Years: 56.00    Total pack years: 56.00    Types: Cigarettes    Quit date: 11/04/2015    Years since quitting: 6.8   Smokeless tobacco: Never  Vaping Use   Vaping Use: Never used  Substance and Sexual Activity   Alcohol use: No   Drug use: No   Sexual activity: Not on file  Other Topics Concern   Not on file  Social History Narrative   Not on file   Social Determinants of Health   Financial Resource Strain: Not on file  Food Insecurity: Not on file  Transportation Needs: Not on file  Physical Activity: Not on file  Stress: Not on file  Social Connections: Not on file  Intimate Partner Violence: Not on file    Family History:    Family History  Problem Relation Age of Onset   Diabetes Brother    Diabetes Maternal Aunt    Diabetes Maternal Uncle    Diabetes Maternal Grandmother      ROS:  Please see the history of present illness.   All other ROS reviewed and negative.     Physical Exam/Data:   Vitals:   08/25/22 1715 08/25/22 1758 08/25/22 1830  BP: (!) 175/98  (!) 154/77  Pulse: (!) 102  73  Resp: 16  16  Temp: 98.7 F (37.1 C)  98 F (36.7 C)  TempSrc: Oral  Oral  SpO2: 98%  100%  Weight:  87.5 kg   Height:  _0  (1.778 m)    No intake or output data in the 24 hours ending 08/25/22 1900    08/25/2022    5:58 PM 07/15/2022    1:54 PM 07/14/2022    9:03 AM  Last 3 Weights  Weight (lbs) 193 lb 190 lb 190 lb 12.8 oz  Weight (kg) 87.544 kg 86.183 kg 86.546 kg     Body mass index is 27.69 kg/m.  General:  Well nourished, well developed, in no acute distress HEENT:  normal Neck: no JVD Vascular: No carotid bruits; Distal pulses 2+ bilaterally Cardiac:  normal S1, S2; RRR; no murmur  Lungs:  clear to auscultation bilaterally, no wheezing, rhonchi or rales  Abd: soft, nontender, no hepatomegaly  Ext: no edema Musculoskeletal:  No deformities, BUE and BLE strength normal and equal Skin: warm and dry  Neuro:  CNs 2-12 intact, no focal abnormalities noted Psych:  Normal affect   EKG:  The EKG was personally  reviewed and demonstrates:  EKG shows normal sinus rhythm, rate 100, no ST abnormalities Telemetry:  Telemetry was personally reviewed and demonstrates:  NSR with rate 60s  Relevant CV Studies:   Laboratory Data:  High Sensitivity Troponin:  No results for input(s): "TROPONINIHS" in the last 720 hours.   Chemistry Recent Labs  Lab 08/25/22 1727  NA 133*  K 3.9  CL 97*  CO2 25  GLUCOSE 220*  BUN 11  CREATININE 0.94  CALCIUM 9.4  MG 1.1*  GFRNONAA >60  ANIONGAP 11    No results for input(s): "PROT", "ALBUMIN", "AST", "ALT", "ALKPHOS", "BILITOT" in the last 168 hours. Lipids No results for input(s): "CHOL", "TRIG", "HDL", "LABVLDL", "LDLCALC", "CHOLHDL" in the last 168 hours.  Hematology Recent Labs  Lab 08/23/22 1525 08/25/22 1727  WBC 4.1 3.9*  RBC 3.87* 4.21*  HGB 12.2* 13.3  HCT 34.8* 37.8*  MCV 89.9 89.8  MCH 31.5 31.6  MCHC 35.1 35.2  RDW 13.9 13.7  PLT 119* 120*   Thyroid  Recent Labs  Lab 08/25/22 1727  TSH 1.525    BNPNo results for input(s): "BNP", "PROBNP" in the last 168 hours.  DDimer No results for input(s): "DDIMER" in the last 168 hours.   Radiology/Studies:  DG Chest Port 1 View  Result Date: 08/25/2022 CLINICAL DATA:  Episode of V-tach. EXAM: PORTABLE CHEST 1 VIEW COMPARISON:  Chest radiograph 12/14/2021 FINDINGS: Stable cardiac and mediastinal contours. Loop recorder device. No large area pulmonary consolidation. No pleural effusion or pneumothorax. IMPRESSION: No active disease. Electronically  Signed   By: Lovey Newcomer M.D.   On: 08/25/2022 18:21     Assessment and Plan:   NSVT: 18 beat episode noted on loop recorder 9/6, appeared to be symptomatic with brief episode of lightheadedness.  Work-up in ED notable for significant hypomagnesemia (1.1) -Replete magnesium for goal Mag>2.  Suspect hypomagnesemia caused by HCTZ, would recommend discontinuing -Continue metoprolol -Echocardiogram -Monitor on telemetry  CAD: Underwent RCA stenting in 2012.  Most recent [facility] 06/08/2017 showed 75% OM1 lesion, 90% ostial D1, patent RCA stent.  Medical management was recommended.  Echocardiogram 12/31/2021 showed normal biventricular function, no significant valvular disease. -Continue Plavix, statin, metoprolol  Hypertension: On HCTZ 25 mg daily, lisinopril 20 mg twice daily, metoprolol 12.5 mg twice daily -Recommend discontinuing HCTZ as above  Hyperlipidemia: On simvastatin 40 mg daily.    Paroxysmal atrial fibrillation: Occurred following PCI in 2012, no evidence of recurrence.  Not on anticoagulation.  Follows with Dr. Quentin Ore.  No A-fib seen so far on loop recorder   For questions or updates, please contact [place] Please consult [email or web address] for contact info under    Signed, Donato Heinz, MD  08/25/2022 7:00 PM

## 2022-08-25 NOTE — Telephone Encounter (Signed)
[employer] Alert~  ILR alert report received. Battery status OK. Normal device function. No new symptom, brady, or pause episodes. No new AF episodes. AF burden is 0% of the time.  There was one tachycardia event detected that appears to be a 18 beat run of VT.  Sent to triage.      Patient called and reports he was standing at the kitchen sink and had a "faint spell." Describes symptoms of dizziness and lightheadedness for a brief period of time. Denies loc. States he felt better a short time later. Compliant with medications on file. Advised I will forward to Dr. Quentin Ore for review and if any changes are warranted we will call back. Patient appreciative of call and voiced understanding.

## 2022-08-25 NOTE — Assessment & Plan Note (Signed)
Chronic and mild without obvious bleeding.

## 2022-08-25 NOTE — Progress Notes (Signed)
  EP aware of pt arrival.   Given ICM and history of non-revascularized CAD on prior cath, will make NPO for possible cath tomorrow.   Aggressively supplement Mg to Keep K > 4.0 and Mg > 2.0. Will give another 2g now and supplement further based on repeat labs.   Legrand Como [address]" [place], PA-C  08/25/2022 9:15 PM

## 2022-08-25 NOTE — Telephone Encounter (Signed)
Bryan Strong called and advised he is on the way to [facility] ER. Dr. Quentin Ore made aware.

## 2022-08-25 NOTE — ED Provider Notes (Signed)
[facility] EMERGENCY DEPARTMENT Provider Note   CSN: [id number] Arrival date & time: 08/25/22  1709     History  Chief Complaint  Patient presents with   Near Syncope   Vtach    Bryan Strong is a 76 y.o. male.   Near Syncope     This patient is a 76 year old male with a prior history of paroxysmal atrial fibrillation many years ago.  He is a diabetic on oral medicines and takes antihypertensives including metoprolol, he takes clopidogrel and takes lisinopril hydrochlorothiazide.  He presents to the hospital stating that yesterday evening around 5:00 he became very lightheaded while he was standing at the sink washing dishes.  This went away after several seconds and he was back to his normal self but this morning when he woke up the company that monitors his loop recorder which was recently placed call to tell him he needed to go to the hospital because he had an episode of ventricular tachycardia that was approximately 18 beats.  He neglected to come to the hospital immediately but rather went to his radiation appointment for his prostate cancer, today was his 20th radiation treatment, completed the treatment and then decided to come to the hospital.  He is not having symptoms at this time including chest pain shortness of breath fevers chills nausea vomiting although he does have some chronic diarrhea related to the radiation therapy.  Home Medications Prior to Admission medications   Medication Sig Start Date End Date Taking? Authorizing Provider  ascorbic acid (VITAMIN C) 500 MG tablet Take 500 mg by mouth daily.    [provider]  azelastine (ASTELIN) 0.1 % nasal spray Place 1 spray into both nostrils 2 (two) times daily. Use in each nostril as directed     [provider]  calcium carbonate (OS-CAL) 1250 (500 Ca) MG chewable tablet Chew 2 tablets by mouth daily as needed.    [provider]  cetirizine (ZYRTEC) 10 MG tablet Take 10 mg  by mouth daily.    [provider]  Cholecalciferol 25 MCG (1000 UT) capsule Take 1,000 Units by mouth daily.    [provider]  clopidogrel (PLAVIX) 75 MG tablet Take 1 tablet by mouth daily. 02/10/20   [provider]  Flaxseed, Linseed, (FLAXSEED OIL PO) Take 1 tablet by mouth 2 (two) times daily.     [provider]  fluticasone (FLONASE) 50 MCG/ACT nasal spray Place 1 spray into both nostrils 2 (two) times daily.     [provider]  glipiZIDE (GLUCOTROL XL) 10 MG 24 hr tablet Take 1 tablet by mouth daily. 11/13/19   [provider]  hydrochlorothiazide (HYDRODIURIL) 25 MG tablet Take 25 mg by mouth daily. 06/23/20 06/29/22  [provider]  lisinopril (ZESTRIL) 20 MG tablet Take 20 mg by mouth 2 (two) times daily. 03/07/22   [provider]  metFORMIN (GLUCOPHAGE) 1000 MG tablet Take 1,000 mg by mouth 2 (two) times daily with a meal.    [provider]  metoprolol tartrate (LOPRESSOR) 25 MG tablet Take 12.5 mg by mouth 2 (two) times daily.    [provider]  Multiple Vitamin (MULTIVITAMIN WITH MINERALS) TABS tablet Take 1 tablet by mouth daily.    [provider]  pantoprazole (PROTONIX) 40 MG tablet Take 40 mg by mouth daily before breakfast.     [provider]  Polyethyl Glycol-Propyl Glycol (SYSTANE ULTRA OP) Apply 2 drops to eye 5 (five) times  daily as needed (dry eyes).    [provider]  simvastatin (ZOCOR) 40 MG tablet Take 1 tablet by mouth at bedtime. 09/25/19   [provider]  tamsulosin (FLOMAX) 0.4 MG CAPS capsule Take 1 capsule (0.4 mg total) by mouth daily. 08/09/22   Noreene Filbert, MD      Allergies    Erythromycin base and Other    Review of Systems   Review of Systems  Cardiovascular:  Positive for near-syncope.  All other systems reviewed and are negative.   Physical Exam Updated Vital Signs BP (!) 154/77 (BP Location: Right Arm)   Pulse 73    Temp 98 F (36.7 C) (Oral)   Resp 16   Ht 1.778 m ('5\' 10"'$ )   Wt 87.5 kg   SpO2 100%   BMI 27.69 kg/m  Physical Exam Vitals and nursing note reviewed.  Constitutional:      General: He is not in acute distress.    Appearance: He is well-developed.  HENT:     Head: Normocephalic and atraumatic.     Mouth/Throat:     Pharynx: No oropharyngeal exudate.  Eyes:     General: No scleral icterus.       Right eye: No discharge.        Left eye: No discharge.     Conjunctiva/sclera: Conjunctivae normal.     Pupils: Pupils are equal, round, and reactive to light.  Neck:     Thyroid: No thyromegaly.     Vascular: No JVD.  Cardiovascular:     Rate and Rhythm: Normal rate and regular rhythm.     Heart sounds: Normal heart sounds. No murmur heard.    No friction rub. No gallop.  Pulmonary:     Effort: Pulmonary effort is normal. No respiratory distress.     Breath sounds: Normal breath sounds. No wheezing or rales.  Abdominal:     General: Bowel sounds are normal. There is no distension.     Palpations: Abdomen is soft. There is no mass.     Tenderness: There is no abdominal tenderness.  Musculoskeletal:        General: No tenderness. Normal range of motion.     Cervical back: Normal range of motion and neck supple.     Right lower leg: No edema.     Left lower leg: No edema.  Lymphadenopathy:     Cervical: No cervical adenopathy.  Skin:    General: Skin is warm and dry.     Findings: No erythema or rash.  Neurological:     Mental Status: He is alert.     Coordination: Coordination normal.  Psychiatric:        Behavior: Behavior normal.     ED Results / Procedures / Treatments   Labs (all labs ordered are listed, but only abnormal results are displayed) Labs Reviewed  BASIC METABOLIC PANEL - Abnormal; Notable for the following components:      Result Value   Sodium 133 (*)    Chloride 97 (*)    Glucose, Bld 220 (*)    All other components within normal limits  CBC  WITH DIFFERENTIAL/PLATELET - Abnormal; Notable for the following components:   WBC 3.9 (*)    RBC 4.21 (*)    HCT 37.8 (*)    Platelets 120 (*)    Lymphs Abs 0.4 (*)    All other components within normal limits  MAGNESIUM - Abnormal; Notable for the following components:   Magnesium  1.1 (*)    All other components within normal limits  TSH  TROPONIN I (HIGH SENSITIVITY)    EKG EKG Interpretation  Date/Time:  Thursday August 25 2022 17:17:59 EDT Ventricular Rate:  100 PR Interval:  204 QRS Duration: 82 QT Interval:  340 QTC Calculation: 438 R Axis:   48 Text Interpretation: Normal sinus rhythm Normal ECG When compared with ECG of 14-Dec-2021 10:49, PREVIOUS ECG IS PRESENT Confirmed by Noemi Chapel [phone]) on 08/25/2022 5:29:10 PM  Radiology DG Chest Port 1 View  Result Date: 08/25/2022 CLINICAL DATA:  Episode of V-tach. EXAM: PORTABLE CHEST 1 VIEW COMPARISON:  Chest radiograph 12/14/2021 FINDINGS: Stable cardiac and mediastinal contours. Loop recorder device. No large area pulmonary consolidation. No pleural effusion or pneumothorax. IMPRESSION: No active disease. Electronically Signed   By: Lovey Newcomer M.D.   On: 08/25/2022 18:21    Procedures .Critical Care  Performed by: Noemi Chapel, MD Authorized by: Noemi Chapel, MD   Critical care provider statement:    Critical care time (minutes):  30   Critical care time was exclusive of:  Separately billable procedures and treating other patients and teaching time   Critical care was necessary to treat or prevent imminent or life-threatening deterioration of the following conditions:  Cardiac failure (severe hypomagnesemia)   Critical care was time spent personally by me on the following activities:  Development of treatment plan with patient or surrogate, discussions with consultants, evaluation of patient's response to treatment, examination of patient, ordering and review of laboratory studies, ordering and review of  radiographic studies, ordering and performing treatments and interventions, pulse oximetry, re-evaluation of patient's condition and review of old charts   I assumed direction of critical care for this patient from another provider in my specialty: no     Care discussed with: admitting provider   Comments:           Medications Ordered in ED Medications  magnesium sulfate IVPB 2 g 50 mL (2 g Intravenous New Bag/Given 08/25/22 1902)    ED Course/ Medical Decision Making/ A&P                           Medical Decision Making Amount and/or Complexity of Data Reviewed Radiology: ordered.  Risk Prescription drug management. Decision regarding hospitalization.   This patient presents to the ED for concern of ventricular tachycardia, this involves an extensive number of treatment options, and is a complaint that carries with it a high risk of complications and morbidity.  The differential diagnosis includes ischemic, electrolyte abnormalities, primary cardiac condition   Co morbidities that complicate the patient evaluation  Hypertension, diabetes, elderly   Additional history obtained:  Additional history obtained from electronic medical record External records from outside source obtained and reviewed including prior ventricular tachycardia report   Lab Tests:  I Ordered, and personally interpreted labs.  The pertinent results include:  severe hypomagnesemia, mild hyponatremia, mild hyperglycemia, thrombocytopenia which appears to be old and chronic, TSH was normal   Imaging Studies ordered:  I ordered imaging studies including chest xray  I independently visualized and interpreted imaging which showed no acute findings I agree with the radiologist interpretation   Cardiac Monitoring: / EKG:  The patient was maintained on a cardiac monitor.  I personally viewed and interpreted the cardiac monitored which showed an underlying rhythm of: Normal sinus  rhythm   Consultations Obtained:  I requested consultation with the cardiology "Christian",  and discussed  lab and imaging findings as well as pertinent plan - they recommend: Admit to medicine, they will follow along, replace magnesium, cardiac monitoring   Problem List / ED Course / Critical interventions / Medication management  2 g of magnesium sulfate were given, patient kept on cardiac monitor I ordered medication including magnesium sulfate for severe hypomagnesemia and ventricular tachycardia Reevaluation of the patient after these medicines showed that the patient improved I have reviewed the patients home medicines and have made adjustments as needed   Social Determinants of Health:  None   Test / Admission - Considered:  We will admit to hospitalist for observation overnight and cardiology consultation Dr. Posey Pronto will admit          Final Clinical Impression(s) / ED Diagnoses Final diagnoses:  Hypomagnesemia  Paroxysmal ventricular tachycardia ([place])     Noemi Chapel, MD 08/25/22 Kathyrn Drown

## 2022-08-25 NOTE — Progress Notes (Signed)
PT awake and alert on RA CPAP set up at bedside. PT not ready to wear, but knows to call for help when ready. No resp distress noted.

## 2022-08-25 NOTE — Telephone Encounter (Signed)
Received phone call from Dr. Quentin Ore. Dr. Quentin Ore gave verbal order for patient to got to the ED for evaluation. [place] ED, if patient not agreeable patient can go to [facility] ED.   CAlled and spoke to patient as well as radiation therapist (patient is at cancer center), advised patient of Dr. Claudie Revering request and explained importance of going. Patient stated he wants to finish his radiation and he will call back to get me know hat he decides. Also spoke to radiation therapist and explained situation and importance, states she will discuss further with patient and give me call back asap. Direct dial given. Dr. Ferman Hamming and updated.

## 2022-08-25 NOTE — Assessment & Plan Note (Signed)
ILR showed 18 beat episode NSVT on 9/6.  This was during brief symptomatic episode of lightheadedness without syncope or chest pain.  He was noted to have a magnesium of 1.1 in the setting of HCTZ use. -Cardiology following -IV magnesium 2 g being given, keep Mg >2 -HCTZ discontinued -Monitor on telemetry -Echocardiogram per cardiology

## 2022-08-25 NOTE — Assessment & Plan Note (Signed)
Continue CPAP nightly. °

## 2022-08-25 NOTE — ED Provider Triage Note (Signed)
Emergency Medicine Provider Triage Evaluation Note  Bryan Strong , a 76 y.o. male  was evaluated in triage.  Pt complains of dizziness yesterday 5 PM.  His loop recorder documented VT, called by cardiology who advised him to go to the ED.  Patient finished his radiation treatment today and is now here in the ED.  Denies chest pain, shortness breath or current symptoms..  Cards note: Note [employer] Alert~   ILR alert report received. Battery status OK. Normal device function. No new symptom, brady, or pause episodes. No new AF episodes. AF burden is 0% of the time.  There was one tachycardia event detected that appears to be a 18 beat run of VT.  Sent to triage.        Patient called and reports he was standing at the kitchen sink and had a "faint spell." Describes symptoms of dizziness and lightheadedness for a brief period of time. Denies loc. States he felt better a short time later. Compliant with medications on file. Advised I will forward to Dr. Quentin Ore for review and if any changes are warranted we will call back. Patient appreciative of call and voiced understanding.        Review of Systems  Per HPI  Physical Exam  There were no vitals taken for this visit. Gen:   Awake, no distress   Resp:  Normal effort  MSK:   Moves extremities without difficulty  Other:    Medical Decision Making  Medically screening exam initiated at 5:15 PM.  Appropriate orders placed.  Bryan Strong was informed that the remainder of the evaluation will be completed by another provider, this initial triage assessment does not replace that evaluation, and the importance of remaining in the ED until their evaluation is complete.     [name], PA-C 08/25/22 1719

## 2022-08-25 NOTE — H&P (Signed)
History and Physical    KARDELL VIRGIL QHU:[id number] DOB: 1946-06-05 DOA: 08/25/2022  PCP: Derinda Late, MD  Patient coming from: Home  I have personally briefly reviewed patient's old medical records in [place]  Chief Complaint: Near syncope, NSVT on loop recorder  HPI: Bryan Strong is a 76 y.o. male with medical history significant for CAD s/p DES RCA 2012, PAF not on anticoagulation, prostate cancer on active IMRT, T2DM, HTN, HLD, AAA, OSA on CPAP, recurrent syncope with loop recorder in place who presented to the ED for evaluation after near syncopal episode with loop recorder showing 18 beat run of NSVT.  Patient states that around 5 PM on 08/24/2022 he was standing up in his kitchen when he had a brief episode of lightheadedness/dizziness with flutter sensation.  He says this only lasted a few seconds.  He did not fall or lose consciousness.  He did not experience any chest pain.  This morning he was called by his cardiology clinic and notified that his loop recorder on the rhythm them that he had a 18 beat run of V. tach during the time of his symptoms.  They advised that they will speak to his cardiologist and return call with recommendations.  Patient states that he did proceed to his usual radiation treatment today and was called during his treatment to present to the ED for further evaluation and management of nonsustained V. tach.  ED Course  Labs/Imaging on admission: I have personally reviewed following labs and imaging studies.  Initial vitals showed Initial vitals showed BP 175/98, pulse 102, RR 16, temp 98.7 F, SPO2 98% on room air.  Labs show magnesium 1.1, sodium 133, potassium 3.9, bicarb 25, BUN 11, creatinine 0.94, serum glucose 220, TSH 1.525, troponin 6, WBC 3.9, hemoglobin 13.3, platelets 120,000.  Portable chest x-ray negative for focal consolidation, edema, effusion.  Loop recorder seen in place.  Patient was given 2 g IV magnesium.  Cardiology were  consulted and recommended overnight observation.  The hospitalist service was consulted to admit for further evaluation and management.  Review of Systems: All systems reviewed and are negative except as documented in history of present illness above.   Past Medical History:  Diagnosis Date   A-fib ([place])    Achalasia of esophagus    Allergic rhinitis    Anemia    Anginal pain ([place])    Aortic aneurysm ([place])    followed by Dr. Franchot Gallo   Coronary artery disease    Diabetes mellitus without complication ([place])    Dyspnea    Emphysema of lung (HCC)    GERD (gastroesophageal reflux disease)    Heart murmur    Hypercholesteremia    Hypertension    Kidney stones    Skin cancer    Sleep apnea    CPAP   Wears dentures    full upper and lower    Past Surgical History:  Procedure Laterality Date   CHOLECYSTECTOMY     colon polyps     COLONOSCOPY WITH PROPOFOL N/A 09/18/2017   Procedure: COLONOSCOPY WITH PROPOFOL;  Surgeon: Manya Silvas, MD;  Location: [facility] ENDOSCOPY;  Service: Endoscopy;  Laterality: N/A;   CORONARY ANGIOPLASTY WITH STENT PLACEMENT  01/03/11   [facility]  Dr. Saralyn Pilar   ECTROPION REPAIR Bilateral 05/03/2016   Procedure: REPAIR OF ECTROPION BILATERAL LOWER EYELIDS;  Surgeon: Karle Starch, MD;  Location: [place];  Service: Ophthalmology;  Laterality: Bilateral;   ESOPHAGOGASTRODUODENOSCOPY (EGD) WITH PROPOFOL N/A  07/21/2017   Procedure: ESOPHAGOGASTRODUODENOSCOPY (EGD) WITH PROPOFOL;  Surgeon: Manya Silvas, MD;  Location: [facility] ENDOSCOPY;  Service: Endoscopy;  Laterality: N/A;   ESOPHAGOGASTRODUODENOSCOPY (EGD) WITH PROPOFOL Bilateral 10/18/2017   Procedure: ESOPHAGOGASTRODUODENOSCOPY (EGD) WITH PROPOFOL;  Surgeon: Manya Silvas, MD;  Location: [facility] ENDOSCOPY;  Service: Endoscopy;  Laterality: Bilateral;   HERNIA REPAIR     LEFT HEART CATH Left 06/08/2017   Procedure: Left Heart Cath and Coronary Angiography;  Surgeon: Yolonda Kida, MD;  Location:  [place] CV LAB;  Service: Cardiovascular;  Laterality: Left;   LID LESION EXCISION Bilateral 05/03/2016   Procedure: ,EXCISION CYST RIGHT UPPER EYELID;  Surgeon: Karle Starch, MD;  Location: [place];  Service: Ophthalmology;  Laterality: Bilateral;  Diabetic - oral meds CPAP   NASAL SEPTUM SURGERY  1967   SAVORY DILATION N/A 07/21/2017   Procedure: SAVORY DILATION;  Surgeon: Manya Silvas, MD;  Location: [facility] ENDOSCOPY;  Service: Endoscopy;  Laterality: N/A;    Social History:  reports that he quit smoking about 6 years ago. His smoking use included cigarettes. He has a 56.00 pack-year smoking history. He has never used smokeless tobacco. He reports that he does not drink alcohol and does not use drugs.  Allergies  Allergen Reactions   Erythromycin Base Rash   Other Rash    ERYTHROMYCIN OINTMENT "some nerve pill, caused HTN"    Family History  Problem Relation Age of Onset   Diabetes Brother    Diabetes Maternal Aunt    Diabetes Maternal Uncle    Diabetes Maternal Grandmother      Prior to Admission medications   Medication Sig Start Date End Date Taking? Authorizing Provider  ascorbic acid (VITAMIN C) 500 MG tablet Take 500 mg by mouth daily.    [provider]  azelastine (ASTELIN) 0.1 % nasal spray Place 1 spray into both nostrils 2 (two) times daily. Use in each nostril as directed     [provider]  calcium carbonate (OS-CAL) 1250 (500 Ca) MG chewable tablet Chew 2 tablets by mouth daily as needed.    [provider]  cetirizine (ZYRTEC) 10 MG tablet Take 10 mg by mouth daily.    [provider]  Cholecalciferol 25 MCG (1000 UT) capsule Take 1,000 Units by mouth daily.    [provider]  clopidogrel (PLAVIX) 75 MG tablet Take 1 tablet by mouth daily. 02/10/20   [provider]  Flaxseed, Linseed, (FLAXSEED OIL PO) Take 1 tablet by mouth 2 (two) times daily.     [provider]  fluticasone  (FLONASE) 50 MCG/ACT nasal spray Place 1 spray into both nostrils 2 (two) times daily.     [provider]  glipiZIDE (GLUCOTROL XL) 10 MG 24 hr tablet Take 1 tablet by mouth daily. 11/13/19   [provider]  hydrochlorothiazide (HYDRODIURIL) 25 MG tablet Take 25 mg by mouth daily. 06/23/20 06/29/22  [provider]  lisinopril (ZESTRIL) 20 MG tablet Take 20 mg by mouth 2 (two) times daily. 03/07/22   [provider]  metFORMIN (GLUCOPHAGE) 1000 MG tablet Take 1,000 mg by mouth 2 (two) times daily with a meal.    [provider]  metoprolol tartrate (LOPRESSOR) 25 MG tablet Take 12.5 mg by mouth 2 (two) times daily.    [provider]  Multiple Vitamin (MULTIVITAMIN WITH MINERALS) TABS tablet Take 1 tablet by mouth daily.    [provider]  pantoprazole (PROTONIX) 40 MG tablet Take  40 mg by mouth daily before breakfast.     [provider]  Polyethyl Glycol-Propyl Glycol (SYSTANE ULTRA OP) Apply 2 drops to eye 5 (five) times daily as needed (dry eyes).    [provider]  simvastatin (ZOCOR) 40 MG tablet Take 1 tablet by mouth at bedtime. 09/25/19   [provider]  tamsulosin (FLOMAX) 0.4 MG CAPS capsule Take 1 capsule (0.4 mg total) by mouth daily. 08/09/22   Noreene Filbert, MD    Physical Exam: Vitals:   08/25/22 1800 08/25/22 1830 08/25/22 1900 08/25/22 1930  BP: 139/76 (!) 154/77 (!) 141/72 (!) 178/84  Pulse: 66 73 63 79  Resp: (!) '21 16 14 13  '$ Temp:  98 F (36.7 C)    TempSrc:  Oral    SpO2: 100% 100% 100% 94%  Weight:      Height:       Constitutional: Resting in bed with head elevated, NAD, calm, comfortable Eyes: EOMI, lids and conjunctivae normal ENMT: Mucous membranes are moist. Posterior pharynx clear of any exudate or lesions.Normal dentition.  Neck: normal, supple, no masses. Respiratory: clear to auscultation bilaterally, no wheezing, no crackles. Normal respiratory effort. No accessory  muscle use.  Cardiovascular: Regular rate and rhythm, no murmurs / rubs / gallops. No extremity edema. 2+ pedal pulses. Abdomen: no tenderness, no masses palpated. Musculoskeletal: no clubbing / cyanosis. No joint deformity upper and lower extremities. Good ROM, no contractures. Normal muscle tone.  Skin: no rashes, lesions, ulcers. No induration Neurologic: Sensation intact. Strength 5/5 in all 4.  Psychiatric:  Alert and oriented x 3. Normal mood.   EKG: Personally reviewed. Normal sinus rhythm, rate 100, no acute ischemic changes.  Rate is faster when compared to prior.  Assessment/Plan Principal Problem:   NSVT (nonsustained ventricular tachycardia) (HCC) Active Problems:   PAF (paroxysmal atrial fibrillation) (HCC)   Hypomagnesemia   CAD (coronary artery disease)   Hypertension associated with diabetes ([place])   Type 2 diabetes mellitus without complications (HCC)   OSA on CPAP   Prostate cancer ([place])   Hyperlipidemia associated with type 2 diabetes mellitus ([place])   Thrombocytopenia ([place])   Bryan Strong is a 76 y.o. male with medical history significant for CAD s/p DES RCA 2012, PAF not on anticoagulation, prostate cancer on active IMRT, T2DM, HTN, HLD, AAA, OSA on CPAP, recurrent syncope with loop recorder in place who is admitted with symptomatic NSVT in setting of hypomagnesemia.  Assessment and Plan: * NSVT (nonsustained ventricular tachycardia) (HCC) ILR showed 18 beat episode NSVT on 9/6.  This was during brief symptomatic episode of lightheadedness without syncope or chest pain.  He was noted to have a magnesium of 1.1 in the setting of HCTZ use. -Cardiology following -IV magnesium 2 g being given, keep Mg >2 -HCTZ discontinued -Monitor on telemetry -Echocardiogram per cardiology  Hypomagnesemia Supplemented, repeat level in AM.  PAF (paroxysmal atrial fibrillation) ([place]) Occurred following PCI in 2012 without evidence of recurrence per cardiology documentation.  Not  on anticoagulation.  No atrial fibrillation seen on the records. -Continue Lopressor 12.5 mg twice daily -Continue Plavix  CAD (coronary artery disease) S/p DES to RCA in 2012.  Denies any chest pain.  EKG without acute ischemic changes.  Troponin negative. -Continue Lopressor, Plavix, simvastatin  Hypertension associated with diabetes ([place]) Continue home lisinopril and Lopressor.  HCTZ discontinued in setting of hypomagnesemia.  Type 2 diabetes mellitus without complications (HCC) Hold metformin and place on SSI.  Thrombocytopenia (HCC) Chronic and mild  without obvious bleeding.  Hyperlipidemia associated with type 2 diabetes mellitus (HCC) Continue simvastatin.  Prostate cancer ([place]) On active IMRT.  OSA on CPAP Continue CPAP nightly.  DVT prophylaxis: enoxaparin (LOVENOX) injection 40 mg Start: 08/25/22 2015 Code Status: Full code, confirmed with patient on admission Family Communication: Sister at bedside Disposition Plan: From home and likely discharge to home pending clinical progress Consults called: Cardiology Severity of Illness: The appropriate patient status for this patient is OBSERVATION. Observation status is judged to be reasonable and necessary in order to provide the required intensity of service to ensure the patient's safety. The patient's presenting symptoms, physical exam findings, and initial radiographic and laboratory data in the context of their medical condition is felt to place them at decreased risk for further clinical deterioration. Furthermore, it is anticipated that the patient will be medically stable for discharge from the hospital within 2 midnights of admission.   Zada Finders MD [facility]  If 7PM-7AM, please contact night-coverage [email or web address]  08/25/2022, 8:12 PM

## 2022-08-25 NOTE — Assessment & Plan Note (Signed)
S/p DES to RCA in 2012.  Denies any chest pain.  EKG without acute ischemic changes.  Troponin negative. -Continue Lopressor, Plavix, simvastatin

## 2022-08-25 NOTE — ED Notes (Signed)
This RN called 6E for purple man.

## 2022-08-25 NOTE — H&P (View-Only) (Signed)
  EP aware of pt arrival.   Given ICM and history of non-revascularized CAD on prior cath, will make NPO for possible cath tomorrow.   Aggressively supplement Mg to Keep K > 4.0 and Mg > 2.0. Will give another 2g now and supplement further based on repeat labs.   Legrand Como [address] [place], PA-C  08/25/2022 9:15 PM

## 2022-08-25 NOTE — Assessment & Plan Note (Signed)
Occurred following PCI in 2012 without evidence of recurrence per cardiology documentation.  Not on anticoagulation.  No atrial fibrillation seen on the records. -Continue Lopressor 12.5 mg twice daily -Continue Plavix

## 2022-08-25 NOTE — ED Triage Notes (Signed)
Pt sent here by cardiologist after loop-recorder found an episode of v-tach last night around approx 1700. Pt reports at that time he felt lightheaded and dizzy, but did not have a syncopal event. Pt reports he was in the kitchen and the episode lasted a few min, then he felt  fine. Pt denies pain, is currently getting radiation and had a appointment for it today.

## 2022-08-25 NOTE — Assessment & Plan Note (Signed)
Continue simvastatin. 

## 2022-08-26 ENCOUNTER — Ambulatory Visit: Payer: PPO

## 2022-08-26 ENCOUNTER — Encounter (HOSPITAL_COMMUNITY)
Admission: EM | Disposition: A | Discharge status: Home / Self Care | Payer: Self-pay | Source: Home / Self Care | Attending: Internal Medicine

## 2022-08-26 ENCOUNTER — Observation Stay (HOSPITAL_COMMUNITY): Payer: PPO

## 2022-08-26 DIAGNOSIS — Z87891 Personal history of nicotine dependence: Secondary | ICD-10-CM | POA: Diagnosis not present

## 2022-08-26 DIAGNOSIS — I719 Aortic aneurysm of unspecified site, without rupture: Secondary | ICD-10-CM | POA: Diagnosis present

## 2022-08-26 DIAGNOSIS — I959 Hypotension, unspecified: Secondary | ICD-10-CM | POA: Diagnosis not present

## 2022-08-26 DIAGNOSIS — Z7902 Long term (current) use of antithrombotics/antiplatelets: Secondary | ICD-10-CM | POA: Diagnosis not present

## 2022-08-26 DIAGNOSIS — Z833 Family history of diabetes mellitus: Secondary | ICD-10-CM | POA: Diagnosis not present

## 2022-08-26 DIAGNOSIS — E1169 Type 2 diabetes mellitus with other specified complication: Secondary | ICD-10-CM | POA: Diagnosis present

## 2022-08-26 DIAGNOSIS — I152 Hypertension secondary to endocrine disorders: Secondary | ICD-10-CM | POA: Diagnosis present

## 2022-08-26 DIAGNOSIS — I3139 Other pericardial effusion (noninflammatory): Secondary | ICD-10-CM

## 2022-08-26 DIAGNOSIS — I4729 Other ventricular tachycardia: Secondary | ICD-10-CM | POA: Diagnosis present

## 2022-08-26 DIAGNOSIS — I2510 Atherosclerotic heart disease of native coronary artery without angina pectoris: Secondary | ICD-10-CM

## 2022-08-26 DIAGNOSIS — I480 Paroxysmal atrial fibrillation: Secondary | ICD-10-CM | POA: Diagnosis present

## 2022-08-26 DIAGNOSIS — D696 Thrombocytopenia, unspecified: Secondary | ICD-10-CM | POA: Diagnosis present

## 2022-08-26 DIAGNOSIS — Z79899 Other long term (current) drug therapy: Secondary | ICD-10-CM | POA: Diagnosis not present

## 2022-08-26 DIAGNOSIS — R55 Syncope and collapse: Secondary | ICD-10-CM | POA: Diagnosis present

## 2022-08-26 DIAGNOSIS — I472 Ventricular tachycardia, unspecified: Secondary | ICD-10-CM

## 2022-08-26 DIAGNOSIS — G4733 Obstructive sleep apnea (adult) (pediatric): Secondary | ICD-10-CM | POA: Diagnosis present

## 2022-08-26 DIAGNOSIS — J439 Emphysema, unspecified: Secondary | ICD-10-CM | POA: Diagnosis present

## 2022-08-26 DIAGNOSIS — K219 Gastro-esophageal reflux disease without esophagitis: Secondary | ICD-10-CM | POA: Diagnosis present

## 2022-08-26 DIAGNOSIS — E7800 Pure hypercholesterolemia, unspecified: Secondary | ICD-10-CM | POA: Diagnosis present

## 2022-08-26 DIAGNOSIS — Z87442 Personal history of urinary calculi: Secondary | ICD-10-CM | POA: Diagnosis not present

## 2022-08-26 DIAGNOSIS — I471 Supraventricular tachycardia: Secondary | ICD-10-CM | POA: Diagnosis present

## 2022-08-26 DIAGNOSIS — Z8719 Personal history of other diseases of the digestive system: Secondary | ICD-10-CM | POA: Diagnosis not present

## 2022-08-26 DIAGNOSIS — C61 Malignant neoplasm of prostate: Secondary | ICD-10-CM | POA: Diagnosis present

## 2022-08-26 DIAGNOSIS — J309 Allergic rhinitis, unspecified: Secondary | ICD-10-CM | POA: Diagnosis present

## 2022-08-26 DIAGNOSIS — Z85828 Personal history of other malignant neoplasm of skin: Secondary | ICD-10-CM | POA: Diagnosis not present

## 2022-08-26 HISTORY — PX: LEFT HEART CATH AND CORONARY ANGIOGRAPHY: CATH118249

## 2022-08-26 LAB — GLUCOSE, CAPILLARY
Glucose-Capillary: 177 mg/dL — ABNORMAL HIGH (ref 70–99)
Glucose-Capillary: 173 mg/dL — ABNORMAL HIGH (ref 70–99)
Glucose-Capillary: 256 mg/dL — ABNORMAL HIGH (ref 70–99)
Glucose-Capillary: 215 mg/dL — ABNORMAL HIGH (ref 70–99)

## 2022-08-26 LAB — CBC
HCT: 35.3 % — ABNORMAL LOW (ref 39.0–52.0)
Hemoglobin: 12.5 g/dL — ABNORMAL LOW (ref 13.0–17.0)
MCH: 31.7 pg (ref 26.0–34.0)
MCHC: 35.4 g/dL (ref 30.0–36.0)
MCV: 89.6 fL (ref 80.0–100.0)
Platelets: 105 10*3/uL — ABNORMAL LOW (ref 150–400)
RBC: 3.94 MIL/uL — ABNORMAL LOW (ref 4.22–5.81)
RDW: 13.8 % (ref 11.5–15.5)
WBC: 4.3 10*3/uL (ref 4.0–10.5)
nRBC: 0 % (ref 0.0–0.2)

## 2022-08-26 LAB — ECHOCARDIOGRAM COMPLETE
AR max vel: 3.08 cm2
AV Area VTI: 2.9 cm2
AV Area mean vel: 2.82 cm2
AV Mean grad: 3 mmHg
AV Peak grad: 6.1 mmHg
Ao pk vel: 1.23 m/s
Area-P 1/2: 2.9 cm2
Height: 70 in
S' Lateral: 2.9 cm
Weight: 2903.02 oz

## 2022-08-26 LAB — BASIC METABOLIC PANEL
Anion gap: 8 (ref 5–15)
BUN: 9 mg/dL (ref 8–23)
CO2: 27 mmol/L (ref 22–32)
Calcium: 8.9 mg/dL (ref 8.9–10.3)
Chloride: 100 mmol/L (ref 98–111)
Creatinine, Ser: 0.9 mg/dL (ref 0.61–1.24)
GFR, Estimated: >60 mL/min (ref >60)
Glucose, Bld: 179 mg/dL — ABNORMAL HIGH (ref 70–99)
Potassium: 4.1 mmol/L (ref 3.5–5.1)
Sodium: 135 mmol/L (ref 135–145)

## 2022-08-26 LAB — MAGNESIUM: Magnesium: 1.7 mg/dL (ref 1.7–2.4)

## 2022-08-26 SURGERY — LEFT HEART CATH AND CORONARY ANGIOGRAPHY
Anesthesia: LOCAL

## 2022-08-26 MED ORDER — FENTANYL CITRATE (PF) 100 MCG/2ML IJ SOLN
INTRAMUSCULAR | Status: AC
  Filled 2022-08-26: qty 2

## 2022-08-26 MED ORDER — SODIUM CHLORIDE 0.9 % IV SOLN: INTRAVENOUS | Status: AC

## 2022-08-26 MED ORDER — IOHEXOL 350 MG/ML SOLN
INTRAVENOUS | Status: DC | PRN
  Administered 2022-08-26: 50 mL

## 2022-08-26 MED ORDER — HEPARIN (PORCINE) IN NACL 1000-0.9 UT/500ML-% IV SOLN
INTRAVENOUS | Status: DC | PRN
  Administered 2022-08-26 (×2): 500 mL

## 2022-08-26 MED ORDER — LIDOCAINE HCL (PF) 1 % IJ SOLN
INTRAMUSCULAR | Status: AC
  Filled 2022-08-26: qty 30

## 2022-08-26 MED ORDER — VERAPAMIL HCL 2.5 MG/ML IV SOLN
INTRAVENOUS | Status: DC | PRN
  Administered 2022-08-26: 10 mL via INTRA_ARTERIAL

## 2022-08-26 MED ORDER — ASPIRIN 81 MG PO CHEW: 81.0000 mg | CHEWABLE_TABLET | ORAL | Status: DC

## 2022-08-26 MED ORDER — ASPIRIN 81 MG PO CHEW
81.0000 mg | CHEWABLE_TABLET | ORAL | Status: AC
  Administered 2022-08-26: 81 mg via ORAL
  Filled 2022-08-26: qty 1

## 2022-08-26 MED ORDER — SODIUM CHLORIDE 0.9 % IV SOLN: 250.0000 mL | INTRAVENOUS | Status: DC | PRN

## 2022-08-26 MED ORDER — ONDANSETRON HCL 4 MG/2ML IJ SOLN: 4.0000 mg | Freq: Four times a day (QID) | INTRAMUSCULAR | Status: DC | PRN

## 2022-08-26 MED ORDER — SODIUM CHLORIDE 0.9% FLUSH
3.0000 mL | Freq: Two times a day (BID) | INTRAVENOUS | Status: DC
  Administered 2022-08-26: 3 mL via INTRAVENOUS

## 2022-08-26 MED ORDER — SODIUM CHLORIDE 0.9% FLUSH: 3.0000 mL | INTRAVENOUS | Status: DC | PRN

## 2022-08-26 MED ORDER — HEPARIN (PORCINE) IN NACL 1000-0.9 UT/500ML-% IV SOLN
INTRAVENOUS | Status: AC
  Filled 2022-08-26: qty 1000

## 2022-08-26 MED ORDER — LACTATED RINGERS IV SOLN: INTRAVENOUS | Status: DC

## 2022-08-26 MED ORDER — SODIUM CHLORIDE 0.9 % IV SOLN: INTRAVENOUS | Status: DC

## 2022-08-26 MED ORDER — MIDAZOLAM HCL 2 MG/2ML IJ SOLN
INTRAMUSCULAR | Status: DC | PRN
  Administered 2022-08-26 (×2): 1 mg via INTRAVENOUS

## 2022-08-26 MED ORDER — LIDOCAINE HCL (PF) 1 % IJ SOLN
INTRAMUSCULAR | Status: DC | PRN
  Administered 2022-08-26: 2 mL

## 2022-08-26 MED ORDER — HEPARIN SODIUM (PORCINE) 1000 UNIT/ML IJ SOLN
INTRAMUSCULAR | Status: AC
  Filled 2022-08-26: qty 10

## 2022-08-26 MED ORDER — MAGNESIUM SULFATE 2 GM/50ML IV SOLN
2.0000 g | Freq: Once | INTRAVENOUS | Status: AC
  Administered 2022-08-26: 2 g via INTRAVENOUS
  Filled 2022-08-26: qty 50

## 2022-08-26 MED ORDER — HYDRALAZINE HCL 20 MG/ML IJ SOLN: 10.0000 mg | INTRAMUSCULAR | Status: AC | PRN

## 2022-08-26 MED ORDER — MIDAZOLAM HCL 2 MG/2ML IJ SOLN
INTRAMUSCULAR | Status: AC
  Filled 2022-08-26: qty 2

## 2022-08-26 MED ORDER — FENTANYL CITRATE (PF) 100 MCG/2ML IJ SOLN
INTRAMUSCULAR | Status: DC | PRN
  Administered 2022-08-26: 25 ug via INTRAVENOUS

## 2022-08-26 MED ORDER — VERAPAMIL HCL 2.5 MG/ML IV SOLN
INTRAVENOUS | Status: AC
  Filled 2022-08-26: qty 2

## 2022-08-26 MED ORDER — SODIUM CHLORIDE 0.9% FLUSH: 3.0000 mL | Freq: Two times a day (BID) | INTRAVENOUS | Status: DC

## 2022-08-26 MED ORDER — ACETAMINOPHEN 325 MG PO TABS: 650.0000 mg | ORAL_TABLET | ORAL | Status: DC | PRN

## 2022-08-26 MED ORDER — LABETALOL HCL 5 MG/ML IV SOLN: 10.0000 mg | INTRAVENOUS | Status: AC | PRN

## 2022-08-26 SURGICAL SUPPLY — 9 items
BAND ZEPHYR COMPRESS 30 LONG (HEMOSTASIS) IMPLANT
CATH 5FR JL3.5 JR4 ANG PIG MP (CATHETERS) IMPLANT
GLIDESHEATH SLEND SS 6F .021 (SHEATH) IMPLANT
GUIDEWIRE INQWIRE 1.5J.035X260 (WIRE) IMPLANT
INQWIRE 1.5J .035X260CM (WIRE) ×1
KIT HEART LEFT (KITS) ×1 IMPLANT
PACK CARDIAC CATHETERIZATION (CUSTOM PROCEDURE TRAY) ×1 IMPLANT
TRANSDUCER W/STOPCOCK (MISCELLANEOUS) ×1 IMPLANT
TUBING CIL FLEX 10 FLL-RA (TUBING) ×1 IMPLANT

## 2022-08-26 NOTE — Plan of Care (Signed)

## 2022-08-26 NOTE — Progress Notes (Signed)
Mobility Specialist - Progress Note   08/26/22 0947  Mobility  Activity Ambulated with assistance in hallway  Level of Assistance Standby assist, set-up cues, supervision of patient - no hands on  Assistive Device Other (Comment) (IV pole)  Distance Ambulated (ft) 470 ft  Activity Response Tolerated well  $Mobility charge 1 Mobility    During mobility: 83 HR Post-mobility:63 HR,119/71 BP,91% SpO2  Pt was received in bed and agreeable to mobility. No c/o pain throughout ambulation. Pt was returned back to bed with all needs met.   Larey Seat

## 2022-08-26 NOTE — Consult Note (Signed)
ELECTROPHYSIOLOGY CONSULT NOTE    Patient ID: Bryan Strong MRN: [id number], DOB/AGE: Jul 16, 1946 76 y.o.  Admit date: 08/25/2022 Date of Consult: 08/26/2022  Primary Physician: Derinda Late, MD Primary Cardiologist: None  Electrophysiologist: Dr. Quentin Ore   Referring Provider: Dr. Gardiner Rhyme  Patient Profile: Bryan Strong is a 76 y.o. male with a history of paroxysmal atrial fibrillation, CAD status post PCI, COPD, hyperlipidemia, PAD, OSA, T2DM, prostate cancer, multiple myeloma who is being seen today for the evaluation of NSVT with near syncope at the request of Dr. Gardiner Rhyme.  HPI:  Bryan Strong is a 76 y.o. male with medical history as above.   He saw Dr. Quentin Ore for syncope on 05/05/2022.  Loop recorder was placed with h/o near syncope.   Patient reported 9/6 brief episode of lightheadedness and near syncope, lasted few seconds.  He was found to have an 18 beat run of NSVT on loop recorder.  Dr. Quentin Ore was called and recommended patient come to ED for further work up.  In the ED, vital signs notable for BP 154/77, pulse 73, SPO2 100% on room air.  Labs notable for sodium 133, potassium 3.9, magnesium 1.1, WBC 3.9, hemoglobin 13.3, platelets 120, TSH 1.5.  EKG shows normal sinus rhythm, rate 100, no ST abnormalities.  Chest x-ray unremarkable.   Underwent RCA stenting in 2012.  Most recent [facility] 06/08/2017 showed 75% OM1 lesion, 90% ostial D1, patent RCA stent.  Medical management was recommended.  Echocardiogram 12/31/2021 showed normal biventricular function, no significant valvular disease.  Today, he is feeling OK at rest. States episodes yesterday came on suddenly and had severe near syncope. No undue SOB.   Past Medical History:  Diagnosis Date   A-fib ([place])    Achalasia of esophagus    Allergic rhinitis    Anemia    Anginal pain ([place])    Aortic aneurysm ([place])    followed by Dr. Franchot Gallo   Coronary artery disease    Diabetes mellitus without complication ([place])    Dyspnea     Emphysema of lung (HCC)    GERD (gastroesophageal reflux disease)    Heart murmur    Hypercholesteremia    Hypertension    Kidney stones    Skin cancer    Sleep apnea    CPAP   Wears dentures    full upper and lower     Surgical History:  Past Surgical History:  Procedure Laterality Date   CHOLECYSTECTOMY     colon polyps     COLONOSCOPY WITH PROPOFOL N/A 09/18/2017   Procedure: COLONOSCOPY WITH PROPOFOL;  Surgeon: Manya Silvas, MD;  Location: [facility] ENDOSCOPY;  Service: Endoscopy;  Laterality: N/A;   CORONARY ANGIOPLASTY WITH STENT PLACEMENT  01/03/11   [facility]  Dr. Saralyn Pilar   ECTROPION REPAIR Bilateral 05/03/2016   Procedure: REPAIR OF ECTROPION BILATERAL LOWER EYELIDS;  Surgeon: Karle Starch, MD;  Location: [place];  Service: Ophthalmology;  Laterality: Bilateral;   ESOPHAGOGASTRODUODENOSCOPY (EGD) WITH PROPOFOL N/A 07/21/2017   Procedure: ESOPHAGOGASTRODUODENOSCOPY (EGD) WITH PROPOFOL;  Surgeon: Manya Silvas, MD;  Location: [facility] ENDOSCOPY;  Service: Endoscopy;  Laterality: N/A;   ESOPHAGOGASTRODUODENOSCOPY (EGD) WITH PROPOFOL Bilateral 10/18/2017   Procedure: ESOPHAGOGASTRODUODENOSCOPY (EGD) WITH PROPOFOL;  Surgeon: Manya Silvas, MD;  Location: [facility] ENDOSCOPY;  Service: Endoscopy;  Laterality: Bilateral;   HERNIA REPAIR     LEFT HEART CATH Left 06/08/2017   Procedure: Left Heart Cath and Coronary Angiography;  Surgeon: Yolonda Kida, MD;  Location: [facility]  INVASIVE CV LAB;  Service: Cardiovascular;  Laterality: Left;   LID LESION EXCISION Bilateral 05/03/2016   Procedure: ,EXCISION CYST RIGHT UPPER EYELID;  Surgeon: Karle Starch, MD;  Location: [place];  Service: Ophthalmology;  Laterality: Bilateral;  Diabetic - oral meds CPAP   NASAL SEPTUM SURGERY  1967   SAVORY DILATION N/A 07/21/2017   Procedure: SAVORY DILATION;  Surgeon: Manya Silvas, MD;  Location: [facility] ENDOSCOPY;  Service: Endoscopy;  Laterality: N/A;     Medications Prior to  Admission  Medication Sig Dispense Refill Last Dose   ascorbic acid (VITAMIN C) 500 MG tablet Take 500 mg by mouth daily.   08/25/2022   azelastine (ASTELIN) 0.1 % nasal spray Place 1 spray into both nostrils 2 (two) times daily. Use in each nostril as directed    08/25/2022   calcium carbonate (OS-CAL) 1250 (500 Ca) MG chewable tablet Chew 2 tablets by mouth daily as needed.   08/24/2022   cetirizine (ZYRTEC) 10 MG tablet Take 10 mg by mouth daily.   08/25/2022   Cholecalciferol 25 MCG (1000 UT) capsule Take 1,000 Units by mouth daily.   08/25/2022   clopidogrel (PLAVIX) 75 MG tablet Take 1 tablet by mouth daily.   08/25/2022   Flaxseed, Linseed, (FLAXSEED OIL PO) Take 1 tablet by mouth 2 (two) times daily.    08/25/2022   fluticasone (FLONASE) 50 MCG/ACT nasal spray Place 1 spray into both nostrils 2 (two) times daily.    08/25/2022   glipiZIDE (GLUCOTROL XL) 10 MG 24 hr tablet Take 1 tablet by mouth daily.   08/25/2022   lisinopril (ZESTRIL) 20 MG tablet Take 20 mg by mouth 2 (two) times daily.   08/25/2022   metFORMIN (GLUCOPHAGE) 1000 MG tablet Take 1,000 mg by mouth 2 (two) times daily with a meal.   08/25/2022   metoprolol tartrate (LOPRESSOR) 25 MG tablet Take 12.5 mg by mouth 2 (two) times daily.   08/25/2022 at 0930   Multiple Vitamin (MULTIVITAMIN WITH MINERALS) TABS tablet Take 1 tablet by mouth daily.   08/25/2022   pantoprazole (PROTONIX) 40 MG tablet Take 40 mg by mouth daily before breakfast.    08/25/2022   Polyethyl Glycol-Propyl Glycol (SYSTANE ULTRA OP) Apply 2 drops to eye in the morning and at bedtime.   08/25/2022   simvastatin (ZOCOR) 40 MG tablet Take 1 tablet by mouth at bedtime.   08/24/2022   tamsulosin (FLOMAX) 0.4 MG CAPS capsule Take 1 capsule (0.4 mg total) by mouth daily. 30 capsule 1 08/25/2022    Inpatient Medications:   clopidogrel  75 mg Oral Daily   enoxaparin (LOVENOX) injection  40 mg Subcutaneous Q24H   insulin aspart  0-9 Units Subcutaneous TID WC   lisinopril  20 mg Oral BID    metoprolol tartrate  12.5 mg Oral BID   pantoprazole  40 mg Oral QAC breakfast   simvastatin  40 mg Oral QHS   sodium chloride flush  3 mL Intravenous Q12H   sodium chloride flush  3 mL Intravenous Q12H   tamsulosin  0.4 mg Oral QPC breakfast    Allergies:  Allergies  Allergen Reactions   Erythromycin Base Rash   Other Rash    ERYTHROMYCIN OINTMENT "some nerve pill, caused HTN"    Social History   Socioeconomic History   Marital status: Married    Spouse name: Not on file   Number of children: Not on file   Years of education: Not on file  Highest education level: Not on file  Occupational History   Not on file  Tobacco Use   Smoking status: Former    Packs/day: 1.00    Years: 56.00    Total pack years: 56.00    Types: Cigarettes    Quit date: 11/04/2015    Years since quitting: 6.8   Smokeless tobacco: Never  Vaping Use   Vaping Use: Never used  Substance and Sexual Activity   Alcohol use: No   Drug use: No   Sexual activity: Not on file  Other Topics Concern   Not on file  Social History Narrative   Not on file   Social Determinants of Health   Financial Resource Strain: Not on file  Food Insecurity: Not on file  Transportation Needs: Not on file  Physical Activity: Not on file  Stress: Not on file  Social Connections: Not on file  Intimate Partner Violence: Not on file     Family History  Problem Relation Age of Onset   Diabetes Brother    Diabetes Maternal Aunt    Diabetes Maternal Uncle    Diabetes Maternal Grandmother      Review of Systems: All other systems reviewed and are otherwise negative except as noted above.  Physical Exam: Vitals:   08/25/22 2151 08/25/22 2305 08/26/22 0552 08/26/22 0803  BP: (!) 181/88 (!) 165/77 (!) 146/75 (!) 165/85  Pulse: 89 83 (!) 57 72  Resp: '18 20 16 17  ' Temp: 98.2 F (36.8 C)  97.8 F (36.6 C) 97.8 F (36.6 C)  TempSrc: Oral  Oral Oral  SpO2: 100% 100% 100% 100%  Weight: 83.2 kg  82.3 kg    Height: '5\' 10"'  (1.778 m)       GEN- The patient is well appearing, alert and oriented x 3 today.   HEENT: normocephalic, atraumatic; sclera clear, conjunctiva pink; hearing intact; oropharynx clear; neck supple Lungs- Clear to ausculation bilaterally, normal work of breathing.  No wheezes, rales, rhonchi Heart- Regular rate and rhythm, no murmurs, rubs or gallops GI- soft, non-tender, non-distended, bowel sounds present Extremities- no clubbing, cyanosis, or edema; DP/PT/radial pulses 2+ bilaterally MS- no significant deformity or atrophy Skin- warm and dry, no rash or lesion Psych- euthymic mood, full affect Neuro- strength and sensation are intact  Labs:   Lab Results  Component Value Date   WBC 4.3 08/26/2022   HGB 12.5 (L) 08/26/2022   HCT 35.3 (L) 08/26/2022   MCV 89.6 08/26/2022   PLT 105 (L) 08/26/2022    Recent Labs  Lab 08/26/22 0455  NA 135  K 4.1  CL 100  CO2 27  BUN 9  CREATININE 0.90  CALCIUM 8.9  GLUCOSE 179*      Radiology/Studies: DG Chest Port 1 View  Result Date: 08/25/2022 CLINICAL DATA:  Episode of V-tach. EXAM: PORTABLE CHEST 1 VIEW COMPARISON:  Chest radiograph 12/14/2021 FINDINGS: Stable cardiac and mediastinal contours. Loop recorder device. No large area pulmonary consolidation. No pleural effusion or pneumothorax. IMPRESSION: No active disease. Electronically Signed   By: Lovey Newcomer M.D.   On: 08/25/2022 18:21    EKG: on arrival showed NSR at 100 bpm (personally reviewed)  TELEMETRY: NSR 70-80s mostly (personally reviewed)  DEVICE HISTORY: [employer] ILR 04/27/2022 for syncope  Assessment/Plan: 1.  NSVT 2. Near syncope With >Moderate CAD by last cath and h/o stenting, concerned that this has worsened.  Plan for cath today.  If no target lesion, will likely need ICD.  Update Echo.  3. CAD Underwent RCA stenting in 2012.  Most recent [facility] 06/08/2017 showed 75% OM1 lesion, 90% ostial D1, patent RCA stent.  Plan for cath today as above.    4. PAF Brief, following PCI in 2012 Not currently on [place], No AF seen so far on loop recorder. Continue to monitor.   For questions or updates, please contact [employer] Please consult [email or web address] for contact info under Cardiology/STEMI.  Jacalyn Lefevre, PA-C  08/26/2022 8:09 AM

## 2022-08-26 NOTE — Interval H&P Note (Signed)
Cath Lab Visit (complete for each Cath Lab visit)  Clinical Evaluation Leading to the Procedure:   ACS: Yes.    Non-ACS:    Anginal Classification: CCS IV  Anti-ischemic medical therapy: Minimal Therapy (1 class of medications)  Non-Invasive Test Results: No non-invasive testing performed  Prior CABG: No previous CABG   Recent NSVT on monitor.    History and Physical Interval Note:  08/26/2022 12:50 PM  Bryan Strong  has presented today for surgery, with the diagnosis of vt.  The various methods of treatment have been discussed with the patient and family. After consideration of risks, benefits and other options for treatment, the patient has consented to  Procedure(s): LEFT HEART CATH AND CORONARY ANGIOGRAPHY (N/A) as a surgical intervention.  The patient's history has been reviewed, patient examined, no change in status, stable for surgery.  I have reviewed the patient's chart and labs.  Questions were answered to the patient's satisfaction.     Larae Grooms

## 2022-08-26 NOTE — Progress Notes (Signed)
  Echocardiogram 2D Echocardiogram has been performed.  Bryan Strong 08/26/2022, 11:48 AM

## 2022-08-26 NOTE — Progress Notes (Signed)
IV team arrived to room, pt has been transported to CT.

## 2022-08-26 NOTE — Plan of Care (Signed)

## 2022-08-26 NOTE — Progress Notes (Signed)
PROGRESS NOTE    Bryan Strong  WUJ:[id number] DOB: 31-Jul-1946 DOA: 08/25/2022 PCP: Derinda Late, MD   Brief Narrative: 76 year old with past medical history significant for CAD status post DES RCA 2012, PAF, not on anticoagulation, prostate cancer on active radiation, diabetes type 2, hypertension, hyperlipidemia, AAA, OSA on CPAP recurrent syncope with loop recorder in place presented to ED for further evaluation after near syncope episode with loop recorder showing 18 beat of runs of known SVT.  He was referred for further evaluation by his cardiology  Plan to proceed with heart cath and probably will need ICD    Assessment & Plan:   Principal Problem:   NSVT (nonsustained ventricular tachycardia) (HCC) Active Problems:   PAF (paroxysmal atrial fibrillation) (HCC)   Hypomagnesemia   CAD (coronary artery disease)   Hypertension associated with diabetes ([place])   Type 2 diabetes mellitus without complications ([place])   OSA on CPAP   Prostate cancer ([place])   Hyperlipidemia associated with type 2 diabetes mellitus (HCC)   Thrombocytopenia ([place])  1-NSVT, Near syncope;  -ILR showed 18 beat episode of NSVT on 9/06. -Cardiology following -Magnesium replaced -Plan to proceed with heart cath and subsequently will need ICD Echo:  2-Hypomagnesemia: Replace  3-PAF;  No A-fib seen on records Continue with Plavix and Lopressor  CAD: History of DES to RCA 2012. Continue Lopressor Plavix and simvastatin  Hypertension: Continue with lisinopril and Lopressor Holding hydrochlorothiazide due to hypomagnesemia  Diabetes type II without complication: Continue to hold metformin while inpatient.  Continue with a sliding scale insulin  Thrombocytopenia: Continue to monitor chronic Hyperlipidemia: Continue with simvastatin  Prostate cancer on active IMRT  OSA on CPAP       Estimated body mass index is 26.03 kg/m as calculated from the following:   Height as of this encounter: 5'  10" (1.778 m).   Weight as of this encounter: 82.3 kg.   DVT prophylaxis: Lovenox Code Status: Full code Family Communication: Care discussed with patient Disposition Plan:  Status is: Observation The patient remains OBS appropriate and will d/c before 2 midnights.    Consultants:  Cardiology   Procedures:    Antimicrobials:    Subjective: He denies pain, dizziness. He is concern he is not getting IV fluids. He was instructed to drink plenty of fluids while undergoing radiation. I have order IV fluids.   Objective: Vitals:   08/26/22 1246 08/26/22 1357 08/26/22 1426 08/26/22 1456  BP:  114/88 138/75 (!) 154/92  Pulse:  69 (!) 54 64  Resp:  '14 18 16  '$ Temp:      TempSrc:      SpO2: 97% 99% 99% 100%  Weight:      Height:        Intake/Output Summary (Last 24 hours) at 08/26/2022 1520 Last data filed at 08/26/2022 0802 Gross per 24 hour  Intake 63.67 ml  Output 2150 ml  Net -2086.33 ml   Filed Weights   08/25/22 1758 08/25/22 2151 08/26/22 0552  Weight: 87.5 kg 83.2 kg 82.3 kg    Examination:  General exam: Appears calm and comfortable  Respiratory system: Clear to auscultation. Respiratory effort normal. Cardiovascular system: S1 & S2 heard, RRR. No JVD, murmurs, rubs, gallops or clicks. No pedal edema. Gastrointestinal system: Abdomen is nondistended, soft and nontender. No organomegaly or masses felt. Normal bowel sounds heard. Central nervous system: Alert and oriented.  Extremities: Symmetric 5 x 5 power.   Data Reviewed: I have personally reviewed following labs and  imaging studies  CBC: Recent Labs  Lab 08/23/22 1525 08/25/22 1727 08/26/22 0455  WBC 4.1 3.9* 4.3  NEUTROABS  --  3.1  --   HGB 12.2* 13.3 12.5*  HCT 34.8* 37.8* 35.3*  MCV 89.9 89.8 89.6  PLT 119* 120* 093*   Basic Metabolic Panel: Recent Labs  Lab 08/25/22 1727 08/26/22 0455  NA 133* 135  K 3.9 4.1  CL 97* 100  CO2 25 27  GLUCOSE 220* 179*  BUN 11 9  CREATININE 0.94  0.90  CALCIUM 9.4 8.9  MG 1.1* 1.7   GFR: Estimated Creatinine Clearance: 69.8 mL/min (by C-G formula based on SCr of 0.9 mg/dL). Liver Function Tests: No results for input(s): "AST", "ALT", "ALKPHOS", "BILITOT", "PROT", "ALBUMIN" in the last 168 hours. No results for input(s): "LIPASE", "AMYLASE" in the last 168 hours. No results for input(s): "AMMONIA" in the last 168 hours. Coagulation Profile: No results for input(s): "INR", "PROTIME" in the last 168 hours. Cardiac Enzymes: No results for input(s): "CKTOTAL", "CKMB", "CKMBINDEX", "TROPONINI" in the last 168 hours. BNP (last 3 results) No results for input(s): "PROBNP" in the last 8760 hours. HbA1C: Recent Labs    08/25/22 2154  HGBA1C 6.5*   CBG: Recent Labs  Lab 08/25/22 2200 08/26/22 0801 08/26/22 1135  GLUCAP 169* 177* 173*   Lipid Profile: No results for input(s): "CHOL", "HDL", "LDLCALC", "TRIG", "CHOLHDL", "LDLDIRECT" in the last 72 hours. Thyroid Function Tests: Recent Labs    08/25/22 1727  TSH 1.525   Anemia Panel: No results for input(s): "VITAMINB12", "FOLATE", "FERRITIN", "TIBC", "IRON", "RETICCTPCT" in the last 72 hours. Sepsis Labs: No results for input(s): "PROCALCITON", "LATICACIDVEN" in the last 168 hours.  No results found for this or any previous visit (from the past 240 hour(s)).       Radiology Studies: CARDIAC CATHETERIZATION  Result Date: 08/26/2022   Ost RCA lesion is 10% stenosed.  No pressure dampening with catheter engagement.   1st Mrg lesion is 50% stenosed.   Mid RCA lesion is 25% stenosed.   1st Diag lesion is 25% stenosed.   Previously placed Dist RCA stent of unknown type is  widely patent.   The left ventricular systolic function is normal.   LV end diastolic pressure is normal.   The left ventricular ejection fraction is 55-65% by visual estimate.   There is no aortic valve stenosis. Patent distal RCA stent.  Mild, branch vessel disease in the OM and diagonal.  Both lesions  appeared improved compared to 2018 catheterization.  Minimal luminal irregularities in the LAD.  No lesions to explain nonsustained ventricular tachycardia. Further plans per EP.   ECHOCARDIOGRAM COMPLETE  Result Date: 08/26/2022    ECHOCARDIOGRAM REPORT   Patient Name:   Bryan Strong Date of Exam: 08/26/2022 Medical Rec #:  [id number]    Height:       70.0 in Accession #:    [id number]   Weight:       181.4 lb Date of Birth:  [birth year]    BSA:          2.003 m Patient Age:    63 years     BP:           165/85 mmHg Patient Gender: M            HR:           59 bpm. Exam Location:  Inpatient Procedure: 2D Echo, Cardiac Doppler and Color Doppler Indications:    Ventricular tachycardia  History:        Patient has no prior history of Echocardiogram examinations.                 CAD, COPD, Arrythmias:Atrial Fibrillation; Risk                 Factors:Diabetes, Sleep Apnea, Dyslipidemia and Former Smoker.                 NSVT.  Sonographer:    Clayton Lefort RDCS (AE) Referring Phys: [id number] [place]  1. Left ventricular ejection fraction, by estimation, is 60 to 65%. The left ventricle has normal function. The left ventricle has no regional wall motion abnormalities. There is moderate concentric left ventricular hypertrophy. Left ventricular diastolic parameters are consistent with Grade I diastolic dysfunction (impaired relaxation).  2. Right ventricular systolic function is normal. The right ventricular size is normal.  3. A small pericardial effusion is present.  4. The mitral valve is normal in structure. Trivial mitral valve regurgitation. No evidence of mitral stenosis.  5. The aortic valve is tricuspid. Aortic valve regurgitation is not visualized. Aortic valve sclerosis is present, with no evidence of aortic valve stenosis.  6. Aortic dilatation noted. There is borderline dilatation of the aortic root, measuring 39 mm.  7. The inferior vena cava is normal in size with greater than 50%  respiratory variability, suggesting right atrial pressure of 3 mmHg. Comparison(s): No prior Echocardiogram. FINDINGS  Left Ventricle: Left ventricular ejection fraction, by estimation, is 60 to 65%. The left ventricle has normal function. The left ventricle has no regional wall motion abnormalities. The left ventricular internal cavity size was normal in size. There is  moderate concentric left ventricular hypertrophy. Left ventricular diastolic parameters are consistent with Grade I diastolic dysfunction (impaired relaxation). Right Ventricle: The right ventricular size is normal. Right ventricular systolic function is normal. Left Atrium: Left atrial size was normal in size. Right Atrium: Right atrial size was normal in size. Pericardium: A small pericardial effusion is present. Mitral Valve: The mitral valve is normal in structure. Mild mitral annular calcification. Trivial mitral valve regurgitation. No evidence of mitral valve stenosis. Tricuspid Valve: The tricuspid valve is normal in structure. Tricuspid valve regurgitation is trivial. No evidence of tricuspid stenosis. Aortic Valve: The aortic valve is tricuspid. Aortic valve regurgitation is not visualized. Aortic valve sclerosis is present, with no evidence of aortic valve stenosis. Aortic valve mean gradient measures 3.0 mmHg. Aortic valve peak gradient measures 6.1  mmHg. Aortic valve area, by VTI measures 2.90 cm. Pulmonic Valve: The pulmonic valve was normal in structure. Pulmonic valve regurgitation is not visualized. No evidence of pulmonic stenosis. Aorta: Aortic dilatation noted. There is borderline dilatation of the aortic root, measuring 39 mm. Venous: The inferior vena cava is normal in size with greater than 50% respiratory variability, suggesting right atrial pressure of 3 mmHg. IAS/Shunts: No atrial level shunt detected by color flow Doppler.  LEFT VENTRICLE PLAX 2D LVIDd:         4.10 cm   Diastology LVIDs:         2.90 cm   LV e' medial:     6.64 cm/s LV PW:         1.80 cm   LV E/e' medial:  9.9 LV IVS:        1.40 cm   LV e' lateral:   7.94 cm/s LVOT diam:     2.30 cm   LV E/e' lateral: 8.3 LV  SV:         82 LV SV Index:   41 LVOT Area:     4.15 cm  RIGHT VENTRICLE RV Basal diam:  2.80 cm RV S prime:     14.80 cm/s TAPSE (M-mode): 2.5 cm LEFT ATRIUM           Index        RIGHT ATRIUM           Index LA diam:      3.20 cm 1.60 cm/m   RA Area:     23.80 cm LA Vol (A2C): 51.0 ml 25.47 ml/m  RA Volume:   67.30 ml  33.60 ml/m LA Vol (A4C): 40.8 ml 20.37 ml/m  AORTIC VALVE AV Area (Vmax):    3.08 cm AV Area (Vmean):   2.82 cm AV Area (VTI):     2.90 cm AV Vmax:           123.00 cm/s AV Vmean:          82.100 cm/s AV VTI:            0.282 m AV Peak Grad:      6.1 mmHg AV Mean Grad:      3.0 mmHg LVOT Vmax:         91.10 cm/s LVOT Vmean:        55.800 cm/s LVOT VTI:          0.197 m LVOT/AV VTI ratio: 0.70  AORTA Ao Root diam: 3.60 cm Ao Asc diam:  3.90 cm MITRAL VALVE MV Area (PHT): 2.90 cm    SHUNTS MV Decel Time: 262 msec    Systemic VTI:  0.20 m MV E velocity: 66.00 cm/s  Systemic Diam: 2.30 cm MV A velocity: 67.80 cm/s MV E/A ratio:  0.97 Kirk Ruths MD Electronically signed by Kirk Ruths MD Signature Date/Time: 08/26/2022/12:52:48 PM    Final    DG Chest Port 1 View  Result Date: 08/25/2022 CLINICAL DATA:  Episode of V-tach. EXAM: PORTABLE CHEST 1 VIEW COMPARISON:  Chest radiograph 12/14/2021 FINDINGS: Stable cardiac and mediastinal contours. Loop recorder device. No large area pulmonary consolidation. No pleural effusion or pneumothorax. IMPRESSION: No active disease. Electronically Signed   By: Lovey Newcomer M.D.   On: 08/25/2022 18:21        Scheduled Meds:  clopidogrel  75 mg Oral Daily   enoxaparin (LOVENOX) injection  40 mg Subcutaneous Q24H   insulin aspart  0-9 Units Subcutaneous TID WC   lisinopril  20 mg Oral BID   metoprolol tartrate  12.5 mg Oral BID   pantoprazole  40 mg Oral QAC breakfast   simvastatin  40  mg Oral QHS   sodium chloride flush  3 mL Intravenous Q12H   sodium chloride flush  3 mL Intravenous Q12H   sodium chloride flush  3 mL Intravenous Q12H   tamsulosin  0.4 mg Oral QPC breakfast   Continuous Infusions:  sodium chloride 10 mL/hr at 08/25/22 2354   sodium chloride     sodium chloride     lactated ringers 50 mL/hr at 08/26/22 1112     LOS: 0 days    Time spent: 35 minutes    Fern Asmar A Marlen Mollica, MD [facility]   If 7PM-7AM, please contact night-coverage [email or web address]  08/26/2022, 3:20 PM

## 2022-08-27 DIAGNOSIS — I4729 Other ventricular tachycardia: Secondary | ICD-10-CM | POA: Diagnosis not present

## 2022-08-27 LAB — BASIC METABOLIC PANEL
Anion gap: 9 (ref 5–15)
BUN: 10 mg/dL (ref 8–23)
CO2: 25 mmol/L (ref 22–32)
Calcium: 8.5 mg/dL — ABNORMAL LOW (ref 8.9–10.3)
Chloride: 102 mmol/L (ref 98–111)
Creatinine, Ser: 0.86 mg/dL (ref 0.61–1.24)
GFR, Estimated: >60 mL/min (ref >60)
Glucose, Bld: 138 mg/dL — ABNORMAL HIGH (ref 70–99)
Potassium: 3.6 mmol/L (ref 3.5–5.1)
Sodium: 136 mmol/L (ref 135–145)

## 2022-08-27 LAB — MAGNESIUM: Magnesium: 1.9 mg/dL (ref 1.7–2.4)

## 2022-08-27 LAB — GLUCOSE, CAPILLARY
Glucose-Capillary: 176 mg/dL — ABNORMAL HIGH (ref 70–99)
Glucose-Capillary: 222 mg/dL — ABNORMAL HIGH (ref 70–99)

## 2022-08-27 MED ORDER — MAGNESIUM OXIDE -MG SUPPLEMENT 400 (240 MG) MG PO TABS
400.0000 mg | ORAL_TABLET | Freq: Every day | ORAL | 1 refills | Status: DC
Start: 2022-08-27 — End: 2022-09-22

## 2022-08-27 MED ORDER — POTASSIUM CHLORIDE CRYS ER 20 MEQ PO TBCR: 20.0000 meq | EXTENDED_RELEASE_TABLET | Freq: Every day | ORAL | 1 refills | Status: DC

## 2022-08-27 MED ORDER — POTASSIUM CHLORIDE CRYS ER 20 MEQ PO TBCR
20.0000 meq | EXTENDED_RELEASE_TABLET | Freq: Every day | ORAL | Status: DC
  Administered 2022-08-27: 20 meq via ORAL
  Filled 2022-08-27: qty 1

## 2022-08-27 MED ORDER — MAGNESIUM OXIDE -MG SUPPLEMENT 400 (240 MG) MG PO TABS
400.0000 mg | ORAL_TABLET | Freq: Every day | ORAL | Status: DC
  Administered 2022-08-27: 400 mg via ORAL
  Filled 2022-08-27: qty 1

## 2022-08-27 MED ORDER — POTASSIUM CHLORIDE CRYS ER 20 MEQ PO TBCR: 20.0000 meq | EXTENDED_RELEASE_TABLET | Freq: Once | ORAL | Status: DC

## 2022-08-27 NOTE — Progress Notes (Signed)
Mobility Specialist - Progress Note   08/27/22 1047  Mobility  Activity Ambulated with assistance in hallway  Level of Assistance Standby assist, set-up cues, supervision of patient - no hands on  Assistive Device Other (Comment) (Iv pole)  Distance Ambulated (ft) 470 ft  Activity Response Tolerated well  $Mobility charge 1 Mobility    Pre-mobility: 67 HR During mobility:74 HR Post-mobility: 64 HR  Received pt in bed having no complaints and agreeable to mobility. Pt was asymptomatic throughout ambulation. Left in bed w/ call bell in reach and all needs met.   Amaya Reives  Mobility Specialist  

## 2022-08-27 NOTE — Progress Notes (Signed)
Went to pt's room for general rounding. Pt  stated that at around 2330 he was getting up to sit on the side of bed to use urinal, went to take CPAP machine off when trying to take machine off connector tubing came off of CPAP and started making a loud noise. Pt tried to shut machine off reached down beside of machine to turn machine off and hit his ear against the machine in the process. Assessed ear, ear has a left upper ear abrasion, there is minimal blood on pillow. Pt is on blood thinners. Pt otherwise asymptomatic. Provider [place] aware, will continue to monitor , call bell within reach.

## 2022-08-27 NOTE — Discharge Summary (Signed)
Physician Discharge Summary   Patient: Bryan Strong MRN: [id number] DOB: [birth year]  Admit date:     08/25/2022  Discharge date: 08/27/22  Discharge Physician: Elmarie Shiley   PCP: Derinda Late, MD   Recommendations at discharge:    Continue to monitor loop recorder.  Needs B-met and Mg level.   Discharge Diagnoses: Principal Problem:   NSVT (nonsustained ventricular tachycardia) (HCC) Active Problems:   PAF (paroxysmal atrial fibrillation) (HCC)   Hypomagnesemia   CAD (coronary artery disease)   Hypertension associated with diabetes ([place])   Type 2 diabetes mellitus without complications (HCC)   OSA on CPAP   Prostate cancer ([place])   Hyperlipidemia associated with type 2 diabetes mellitus (HCC)   Thrombocytopenia ([place])  Resolved Problems:   * No resolved hospital problems. *  Hospital Course: 76 year old with past medical history significant for CAD status post DES RCA 2012, PAF, not on anticoagulation, prostate cancer on active radiation, diabetes type 2, hypertension, hyperlipidemia, AAA, OSA on CPAP recurrent syncope with loop recorder in place presented to ED for further evaluation after near syncope episode with loop recorder showing 18 beat of runs of known SVT.  He was referred for further evaluation by his cardiology   Plan to proceed with heart cath and probably will need ICD    Assessment and Plan: 1-NSVT, Near syncope;  -ILR showed 18 beat episode of NSVT on 9/06. -Cardiology following. Underwent [place] cath; stable CAD.  -Magnesium replaced -Plan to proceed with heart cath and subsequently will need ICD Echo: Normal EF  NSVT thought to be related to hypomagnesemia. EP does not recommend ICD. Plan to discharge and  continue to monitor loop record.  Close follow up with EP  2-Hypomagnesemia: Replaced. Discharge on magnesium supplement.     3-PAF;  No A-fib seen on records Continue with Plavix and Lopressor   CAD: History of DES to RCA  2012. Continue Lopressor Plavix and simvastatin   Hypertension: Continue with lisinopril and Lopressor Holding hydrochlorothiazide due to hypomagnesemia   Diabetes type II without complication: Continue to hold metformin while inpatient.  Continue with a sliding scale insulin   Thrombocytopenia: Continue to monitor chronic Hyperlipidemia: Continue with simvastatin   Prostate cancer on active IMRT   OSA on CPAP           Consultants: Cardiology  Procedures performed: Cath  Disposition: Home Diet recommendation:  Discharge Diet Orders (From admission, onward)     Start     Ordered   08/27/22 0000  Diet - low sodium heart healthy        08/27/22 1054           Cardiac diet DISCHARGE MEDICATION: Allergies as of 08/27/2022       Reactions   Erythromycin Base Rash   Other Rash   ERYTHROMYCIN OINTMENT "some nerve pill, caused HTN"        Medication List     TAKE these medications    ascorbic acid 500 MG tablet Commonly known as: VITAMIN C Take 500 mg by mouth daily.   azelastine 0.1 % nasal spray Commonly known as: ASTELIN Place 1 spray into both nostrils 2 (two) times daily. Use in each nostril as directed   calcium carbonate 1250 (500 Ca) MG chewable tablet Commonly known as: OS-CAL Chew 2 tablets by mouth daily as needed.   cetirizine 10 MG tablet Commonly known as: ZYRTEC Take 10 mg by mouth daily.   Cholecalciferol 25 MCG (1000 UT) capsule Take 1,000  Units by mouth daily.   clopidogrel 75 MG tablet Commonly known as: PLAVIX Take 1 tablet by mouth daily.   FLAXSEED OIL PO Take 1 tablet by mouth 2 (two) times daily.   fluticasone 50 MCG/ACT nasal spray Commonly known as: FLONASE Place 1 spray into both nostrils 2 (two) times daily.   glipiZIDE 10 MG 24 hr tablet Commonly known as: GLUCOTROL XL Take 1 tablet by mouth daily.   lisinopril 20 MG tablet Commonly known as: ZESTRIL Take 20 mg by mouth 2 (two) times daily.   magnesium  oxide 400 (240 Mg) MG tablet Commonly known as: MAG-OX Take 1 tablet (400 mg total) by mouth daily.   metFORMIN 1000 MG tablet Commonly known as: GLUCOPHAGE Take 1,000 mg by mouth 2 (two) times daily with a meal.   metoprolol tartrate 25 MG tablet Commonly known as: LOPRESSOR Take 12.5 mg by mouth 2 (two) times daily.   multivitamin with minerals Tabs tablet Take 1 tablet by mouth daily.   pantoprazole 40 MG tablet Commonly known as: PROTONIX Take 40 mg by mouth daily before breakfast.   potassium chloride SA 20 MEQ tablet Commonly known as: KLOR-CON M Take 1 tablet (20 mEq total) by mouth daily.   simvastatin 40 MG tablet Commonly known as: ZOCOR Take 1 tablet by mouth at bedtime.   SYSTANE ULTRA OP Apply 2 drops to eye in the morning and at bedtime.   tamsulosin 0.4 MG Caps capsule Commonly known as: FLOMAX Take 1 capsule (0.4 mg total) by mouth daily.        Follow-up Information     Shirley Friar, PA-C Follow up on 09/02/2022.   Specialty: Physician Assistant Why: at [place] information: [place] [address] [phone]         Derinda Late, MD Follow up in 1 week(s).   Specialty: Family Medicine Contact information: [address] [place] [address] [phone]         Vickie Epley, MD .   Specialties: Cardiology, Radiology Contact information: [place] [address] [phone]                Discharge Exam: Danley Danker Weights   08/25/22 2151 08/26/22 0552 08/27/22 0700  Weight: 83.2 kg 82.3 kg 81.8 kg   General. NAD  Condition at discharge: stable  The results of significant diagnostics from this hospitalization (including imaging, microbiology, ancillary and laboratory) are listed below for reference.   Imaging Studies: CARDIAC CATHETERIZATION  Result Date: 08/26/2022   Ost RCA lesion is 10% stenosed.  No pressure dampening with catheter  engagement.   1st Mrg lesion is 50% stenosed.   Mid RCA lesion is 25% stenosed.   1st Diag lesion is 25% stenosed.   Previously placed Dist RCA stent of unknown type is  widely patent.   The left ventricular systolic function is normal.   LV end diastolic pressure is normal.   The left ventricular ejection fraction is 55-65% by visual estimate.   There is no aortic valve stenosis. Patent distal RCA stent.  Mild, branch vessel disease in the OM and diagonal.  Both lesions appeared improved compared to 2018 catheterization.  Minimal luminal irregularities in the LAD.  No lesions to explain nonsustained ventricular tachycardia. Further plans per EP.   ECHOCARDIOGRAM COMPLETE  Result Date: 08/26/2022    ECHOCARDIOGRAM REPORT   Patient Name:   ALEXXANDER KURT Date of Exam: 08/26/2022 Medical  Rec #:  [id number]    Height:       70.0 in Accession #:    [id number]   Weight:       181.4 lb Date of Birth:  [birth year]    BSA:          2.003 m Patient Age:    15 years     BP:           165/85 mmHg Patient Gender: M            HR:           59 bpm. Exam Location:  Inpatient Procedure: 2D Echo, Cardiac Doppler and Color Doppler Indications:    Ventricular tachycardia  History:        Patient has no prior history of Echocardiogram examinations.                 CAD, COPD, Arrythmias:Atrial Fibrillation; Risk                 Factors:Diabetes, Sleep Apnea, Dyslipidemia and Former Smoker.                 NSVT.  Sonographer:    Clayton Lefort RDCS (AE) Referring Phys: [id number] [place]  1. Left ventricular ejection fraction, by estimation, is 60 to 65%. The left ventricle has normal function. The left ventricle has no regional wall motion abnormalities. There is moderate concentric left ventricular hypertrophy. Left ventricular diastolic parameters are consistent with Grade I diastolic dysfunction (impaired relaxation).  2. Right ventricular systolic function is normal. The right ventricular size is normal.  3. A  small pericardial effusion is present.  4. The mitral valve is normal in structure. Trivial mitral valve regurgitation. No evidence of mitral stenosis.  5. The aortic valve is tricuspid. Aortic valve regurgitation is not visualized. Aortic valve sclerosis is present, with no evidence of aortic valve stenosis.  6. Aortic dilatation noted. There is borderline dilatation of the aortic root, measuring 39 mm.  7. The inferior vena cava is normal in size with greater than 50% respiratory variability, suggesting right atrial pressure of 3 mmHg. Comparison(s): No prior Echocardiogram. FINDINGS  Left Ventricle: Left ventricular ejection fraction, by estimation, is 60 to 65%. The left ventricle has normal function. The left ventricle has no regional wall motion abnormalities. The left ventricular internal cavity size was normal in size. There is  moderate concentric left ventricular hypertrophy. Left ventricular diastolic parameters are consistent with Grade I diastolic dysfunction (impaired relaxation). Right Ventricle: The right ventricular size is normal. Right ventricular systolic function is normal. Left Atrium: Left atrial size was normal in size. Right Atrium: Right atrial size was normal in size. Pericardium: A small pericardial effusion is present. Mitral Valve: The mitral valve is normal in structure. Mild mitral annular calcification. Trivial mitral valve regurgitation. No evidence of mitral valve stenosis. Tricuspid Valve: The tricuspid valve is normal in structure. Tricuspid valve regurgitation is trivial. No evidence of tricuspid stenosis. Aortic Valve: The aortic valve is tricuspid. Aortic valve regurgitation is not visualized. Aortic valve sclerosis is present, with no evidence of aortic valve stenosis. Aortic valve mean gradient measures 3.0 mmHg. Aortic valve peak gradient measures 6.1  mmHg. Aortic valve area, by VTI measures 2.90 cm. Pulmonic Valve: The pulmonic valve was normal in structure. Pulmonic  valve regurgitation is not visualized. No evidence of pulmonic stenosis. Aorta: Aortic dilatation noted. There is borderline dilatation of the aortic root, measuring 39 mm. Venous: The inferior  vena cava is normal in size with greater than 50% respiratory variability, suggesting right atrial pressure of 3 mmHg. IAS/Shunts: No atrial level shunt detected by color flow Doppler.  LEFT VENTRICLE PLAX 2D LVIDd:         4.10 cm   Diastology LVIDs:         2.90 cm   LV e' medial:    6.64 cm/s LV PW:         1.80 cm   LV E/e' medial:  9.9 LV IVS:        1.40 cm   LV e' lateral:   7.94 cm/s LVOT diam:     2.30 cm   LV E/e' lateral: 8.3 LV SV:         82 LV SV Index:   41 LVOT Area:     4.15 cm  RIGHT VENTRICLE RV Basal diam:  2.80 cm RV S prime:     14.80 cm/s TAPSE (M-mode): 2.5 cm LEFT ATRIUM           Index        RIGHT ATRIUM           Index LA diam:      3.20 cm 1.60 cm/m   RA Area:     23.80 cm LA Vol (A2C): 51.0 ml 25.47 ml/m  RA Volume:   67.30 ml  33.60 ml/m LA Vol (A4C): 40.8 ml 20.37 ml/m  AORTIC VALVE AV Area (Vmax):    3.08 cm AV Area (Vmean):   2.82 cm AV Area (VTI):     2.90 cm AV Vmax:           123.00 cm/s AV Vmean:          82.100 cm/s AV VTI:            0.282 m AV Peak Grad:      6.1 mmHg AV Mean Grad:      3.0 mmHg LVOT Vmax:         91.10 cm/s LVOT Vmean:        55.800 cm/s LVOT VTI:          0.197 m LVOT/AV VTI ratio: 0.70  AORTA Ao Root diam: 3.60 cm Ao Asc diam:  3.90 cm MITRAL VALVE MV Area (PHT): 2.90 cm    SHUNTS MV Decel Time: 262 msec    Systemic VTI:  0.20 m MV E velocity: 66.00 cm/s  Systemic Diam: 2.30 cm MV A velocity: 67.80 cm/s MV E/A ratio:  0.97 Kirk Ruths MD Electronically signed by Kirk Ruths MD Signature Date/Time: 08/26/2022/12:52:48 PM    Final    DG Chest Port 1 View  Result Date: 08/25/2022 CLINICAL DATA:  Episode of V-tach. EXAM: PORTABLE CHEST 1 VIEW COMPARISON:  Chest radiograph 12/14/2021 FINDINGS: Stable cardiac and mediastinal contours. Loop recorder  device. No large area pulmonary consolidation. No pleural effusion or pneumothorax. IMPRESSION: No active disease. Electronically Signed   By: Lovey Newcomer M.D.   On: 08/25/2022 18:21    Microbiology: No results found for this or any previous visit.  Labs: CBC: Recent Labs  Lab 08/23/22 1525 08/25/22 1727 08/26/22 0455  WBC 4.1 3.9* 4.3  NEUTROABS  --  3.1  --   HGB 12.2* 13.3 12.5*  HCT 34.8* 37.8* 35.3*  MCV 89.9 89.8 89.6  PLT 119* 120* 299*   Basic Metabolic Panel: Recent Labs  Lab 08/25/22 1727 08/26/22 0455 08/27/22 0234  NA 133* 135 136  K 3.9 4.1  3.6  CL 97* 100 102  CO2 '25 27 25  ' GLUCOSE 220* 179* 138*  BUN '11 9 10  ' CREATININE 0.94 0.90 0.86  CALCIUM 9.4 8.9 8.5*  MG 1.1* 1.7 1.9   Liver Function Tests: No results for input(s): "AST", "ALT", "ALKPHOS", "BILITOT", "PROT", "ALBUMIN" in the last 168 hours. CBG: Recent Labs  Lab 08/26/22 1135 08/26/22 1655 08/26/22 2056 08/27/22 0730 08/27/22 1122  GLUCAP 173* 256* 215* 176* 222*    Discharge time spent: greater than 30 minutes.  Signed: Elmarie Shiley, MD [facility] 08/27/2022

## 2022-08-27 NOTE — Progress Notes (Signed)
Rounding Note    Patient Name: DENIM KALMBACH Date of Encounter: 08/27/2022  [place] Cardiologist: None   Subjective   NAEO. LHC yesterday showed stable/improved CAD. Rhythm stable while inpatient.  Inpatient Medications    Scheduled Meds:  clopidogrel  75 mg Oral Daily   enoxaparin (LOVENOX) injection  40 mg Subcutaneous Q24H   insulin aspart  0-9 Units Subcutaneous TID WC   lisinopril  20 mg Oral BID   metoprolol tartrate  12.5 mg Oral BID   pantoprazole  40 mg Oral QAC breakfast   simvastatin  40 mg Oral QHS   sodium chloride flush  3 mL Intravenous Q12H   sodium chloride flush  3 mL Intravenous Q12H   sodium chloride flush  3 mL Intravenous Q12H   tamsulosin  0.4 mg Oral QPC breakfast   Continuous Infusions:  sodium chloride 10 mL/hr at 08/25/22 2354   sodium chloride     lactated ringers 50 mL/hr at 08/27/22 0445   PRN Meds: sodium chloride, acetaminophen **OR** acetaminophen, acetaminophen, hydrALAZINE, ondansetron (ZOFRAN) IV, senna-docusate, sodium chloride flush   Vital Signs    Vitals:   08/26/22 2249 08/27/22 0700 08/27/22 0704 08/27/22 0809  BP:   (!) 153/85   Pulse: 60  84 94  Resp: '18 15 20   '$ Temp:   98.6 F (37 C)   TempSrc:   Oral   SpO2: 100%     Weight:  81.8 kg    Height:        Intake/Output Summary (Last 24 hours) at 08/27/2022 0920 Last data filed at 08/26/2022 2000 Gross per 24 hour  Intake 117.89 ml  Output 1120 ml  Net -1002.11 ml      08/27/2022    7:00 AM 08/26/2022    5:52 AM 08/25/2022    9:51 PM  Last 3 Weights  Weight (lbs) 180 lb 4.8 oz 181 lb 7 oz 183 lb 6.4 oz  Weight (kg) 81.784 kg 82.3 kg 83.19 kg      Telemetry    Personally Reviewed  ECG    Personally Reviewed  Physical Exam   GEN: No acute distress.   Neck: No JVD Cardiac: RRR, no murmurs, rubs, or gallops.  Respiratory: Clear to auscultation bilaterally. GI: Soft, nontender, non-distended  MS: No edema; No deformity. Neuro:  Nonfocal   Psych: Normal affect   Labs    High Sensitivity Troponin:   Recent Labs  Lab 08/25/22 1753  TROPONINIHS 6     Chemistry Recent Labs  Lab 08/25/22 1727 08/26/22 0455 08/27/22 0234  NA 133* 135 136  K 3.9 4.1 3.6  CL 97* 100 102  CO2 '25 27 25  '$ GLUCOSE 220* 179* 138*  BUN '11 9 10  '$ CREATININE 0.94 0.90 0.86  CALCIUM 9.4 8.9 8.5*  MG 1.1* 1.7 1.9  GFRNONAA >60 >60 >60  ANIONGAP '11 8 9    '$ Lipids No results for input(s): "CHOL", "TRIG", "HDL", "LABVLDL", "LDLCALC", "CHOLHDL" in the last 168 hours.  Hematology Recent Labs  Lab 08/23/22 1525 08/25/22 1727 08/26/22 0455  WBC 4.1 3.9* 4.3  RBC 3.87* 4.21* 3.94*  HGB 12.2* 13.3 12.5*  HCT 34.8* 37.8* 35.3*  MCV 89.9 89.8 89.6  MCH 31.5 31.6 31.7  MCHC 35.1 35.2 35.4  RDW 13.9 13.7 13.8  PLT 119* 120* 105*   Thyroid  Recent Labs  Lab 08/25/22 1727  TSH 1.525    BNPNo results for input(s): "BNP", "PROBNP" in the last 168 hours.  DDimer No results for input(s): "DDIMER" in the last 168 hours.   Radiology    CARDIAC CATHETERIZATION  Result Date: 08/26/2022   Ost RCA lesion is 10% stenosed.  No pressure dampening with catheter engagement.   1st Mrg lesion is 50% stenosed.   Mid RCA lesion is 25% stenosed.   1st Diag lesion is 25% stenosed.   Previously placed Dist RCA stent of unknown type is  widely patent.   The left ventricular systolic function is normal.   LV end diastolic pressure is normal.   The left ventricular ejection fraction is 55-65% by visual estimate.   There is no aortic valve stenosis. Patent distal RCA stent.  Mild, branch vessel disease in the OM and diagonal.  Both lesions appeared improved compared to 2018 catheterization.  Minimal luminal irregularities in the LAD.  No lesions to explain nonsustained ventricular tachycardia. Further plans per EP.   ECHOCARDIOGRAM COMPLETE  Result Date: 08/26/2022    ECHOCARDIOGRAM REPORT   Patient Name:   GIACOMO VALONE Date of Exam: 08/26/2022 Medical Rec #:  [id number]     Height:       70.0 in Accession #:    [id number]   Weight:       181.4 lb Date of Birth:  [birth year]    BSA:          2.003 m Patient Age:    76 years     BP:           165/85 mmHg Patient Gender: M            HR:           59 bpm. Exam Location:  Inpatient Procedure: 2D Echo, Cardiac Doppler and Color Doppler Indications:    Ventricular tachycardia  History:        Patient has no prior history of Echocardiogram examinations.                 CAD, COPD, Arrythmias:Atrial Fibrillation; Risk                 Factors:Diabetes, Sleep Apnea, Dyslipidemia and Former Smoker.                 NSVT.  Sonographer:    Clayton Lefort RDCS (AE) Referring Phys: [id number] [place]  1. Left ventricular ejection fraction, by estimation, is 60 to 65%. The left ventricle has normal function. The left ventricle has no regional wall motion abnormalities. There is moderate concentric left ventricular hypertrophy. Left ventricular diastolic parameters are consistent with Grade I diastolic dysfunction (impaired relaxation).  2. Right ventricular systolic function is normal. The right ventricular size is normal.  3. A small pericardial effusion is present.  4. The mitral valve is normal in structure. Trivial mitral valve regurgitation. No evidence of mitral stenosis.  5. The aortic valve is tricuspid. Aortic valve regurgitation is not visualized. Aortic valve sclerosis is present, with no evidence of aortic valve stenosis.  6. Aortic dilatation noted. There is borderline dilatation of the aortic root, measuring 39 mm.  7. The inferior vena cava is normal in size with greater than 50% respiratory variability, suggesting right atrial pressure of 3 mmHg. Comparison(s): No prior Echocardiogram. FINDINGS  Left Ventricle: Left ventricular ejection fraction, by estimation, is 60 to 65%. The left ventricle has normal function. The left ventricle has no regional wall motion abnormalities. The left ventricular internal cavity size  was normal in size. There is  moderate concentric left  ventricular hypertrophy. Left ventricular diastolic parameters are consistent with Grade I diastolic dysfunction (impaired relaxation). Right Ventricle: The right ventricular size is normal. Right ventricular systolic function is normal. Left Atrium: Left atrial size was normal in size. Right Atrium: Right atrial size was normal in size. Pericardium: A small pericardial effusion is present. Mitral Valve: The mitral valve is normal in structure. Mild mitral annular calcification. Trivial mitral valve regurgitation. No evidence of mitral valve stenosis. Tricuspid Valve: The tricuspid valve is normal in structure. Tricuspid valve regurgitation is trivial. No evidence of tricuspid stenosis. Aortic Valve: The aortic valve is tricuspid. Aortic valve regurgitation is not visualized. Aortic valve sclerosis is present, with no evidence of aortic valve stenosis. Aortic valve mean gradient measures 3.0 mmHg. Aortic valve peak gradient measures 6.1  mmHg. Aortic valve area, by VTI measures 2.90 cm. Pulmonic Valve: The pulmonic valve was normal in structure. Pulmonic valve regurgitation is not visualized. No evidence of pulmonic stenosis. Aorta: Aortic dilatation noted. There is borderline dilatation of the aortic root, measuring 39 mm. Venous: The inferior vena cava is normal in size with greater than 50% respiratory variability, suggesting right atrial pressure of 3 mmHg. IAS/Shunts: No atrial level shunt detected by color flow Doppler.  LEFT VENTRICLE PLAX 2D LVIDd:         4.10 cm   Diastology LVIDs:         2.90 cm   LV e' medial:    6.64 cm/s LV PW:         1.80 cm   LV E/e' medial:  9.9 LV IVS:        1.40 cm   LV e' lateral:   7.94 cm/s LVOT diam:     2.30 cm   LV E/e' lateral: 8.3 LV SV:         82 LV SV Index:   41 LVOT Area:     4.15 cm  RIGHT VENTRICLE RV Basal diam:  2.80 cm RV S prime:     14.80 cm/s TAPSE (M-mode): 2.5 cm LEFT ATRIUM           Index         RIGHT ATRIUM           Index LA diam:      3.20 cm 1.60 cm/m   RA Area:     23.80 cm LA Vol (A2C): 51.0 ml 25.47 ml/m  RA Volume:   67.30 ml  33.60 ml/m LA Vol (A4C): 40.8 ml 20.37 ml/m  AORTIC VALVE AV Area (Vmax):    3.08 cm AV Area (Vmean):   2.82 cm AV Area (VTI):     2.90 cm AV Vmax:           123.00 cm/s AV Vmean:          82.100 cm/s AV VTI:            0.282 m AV Peak Grad:      6.1 mmHg AV Mean Grad:      3.0 mmHg LVOT Vmax:         91.10 cm/s LVOT Vmean:        55.800 cm/s LVOT VTI:          0.197 m LVOT/AV VTI ratio: 0.70  AORTA Ao Root diam: 3.60 cm Ao Asc diam:  3.90 cm MITRAL VALVE MV Area (PHT): 2.90 cm    SHUNTS MV Decel Time: 262 msec    Systemic VTI:  0.20 m MV E velocity: 66.00  cm/s  Systemic Diam: 2.30 cm MV A velocity: 67.80 cm/s MV E/A ratio:  0.97 Kirk Ruths MD Electronically signed by Kirk Ruths MD Signature Date/Time: 08/26/2022/12:52:48 PM    Final    DG Chest Port 1 View  Result Date: 08/25/2022 CLINICAL DATA:  Episode of V-tach. EXAM: PORTABLE CHEST 1 VIEW COMPARISON:  Chest radiograph 12/14/2021 FINDINGS: Stable cardiac and mediastinal contours. Loop recorder device. No large area pulmonary consolidation. No pleural effusion or pneumothorax. IMPRESSION: No active disease. Electronically Signed   By: Lovey Newcomer M.D.   On: 08/25/2022 18:21      Assessment & Plan    Mr Sedivy is a pleasant 76yo man who I am seeing today for an evaluation of near syncope at the request of Dr Gardiner Rhyme. I know Mr Holloran from the outpatient setting. He was at home and experienced a presyncopal episode associated with a wide complex tachycardia noted on his loop recorder.   #Presyncope Likely hypotension in the setting of salvos of SVT. Less likely VT given loop tracings showing rhythm narrowing at a slower CL.  I suspect electrolyte derangements contributed to his arrhythmia. Keep K>4, Mg>2  #CAD No significant progression on LHC Echo stable.  OK to discharge today with close  outpatient follow up.   For questions or updates, please contact [place] Please consult [email or web address] for contact info under        Signed, Vickie Epley, MD  08/27/2022, 9:20 AM

## 2022-08-29 ENCOUNTER — Other Ambulatory Visit: Payer: Self-pay

## 2022-08-29 ENCOUNTER — Encounter (HOSPITAL_COMMUNITY): Payer: Self-pay | Admitting: Interventional Cardiology

## 2022-08-29 ENCOUNTER — Ambulatory Visit
Admission: RE | Admit: 2022-08-29 | Discharge: 2022-08-29 | Disposition: A | Discharge status: Home / Self Care | Payer: PPO | Source: Ambulatory Visit | Attending: Radiation Oncology | Admitting: Radiation Oncology

## 2022-08-29 ENCOUNTER — Ambulatory Visit (INDEPENDENT_AMBULATORY_CARE_PROVIDER_SITE_OTHER): Payer: PPO

## 2022-08-29 ENCOUNTER — Ambulatory Visit: Payer: PPO | Attending: Medical | Admitting: Medical

## 2022-08-29 VITALS — BP 134/70 | HR 64 | Ht 70.0 in | Wt 192.8 lb

## 2022-08-29 DIAGNOSIS — R55 Syncope and collapse: Secondary | ICD-10-CM | POA: Diagnosis not present

## 2022-08-29 DIAGNOSIS — I2510 Atherosclerotic heart disease of native coronary artery without angina pectoris: Secondary | ICD-10-CM

## 2022-08-29 DIAGNOSIS — I10 Essential (primary) hypertension: Secondary | ICD-10-CM

## 2022-08-29 DIAGNOSIS — R000 Tachycardia, unspecified: Secondary | ICD-10-CM

## 2022-08-29 DIAGNOSIS — C61 Malignant neoplasm of prostate: Secondary | ICD-10-CM | POA: Diagnosis not present

## 2022-08-29 DIAGNOSIS — E782 Mixed hyperlipidemia: Secondary | ICD-10-CM | POA: Diagnosis not present

## 2022-08-29 DIAGNOSIS — Z191 Hormone sensitive malignancy status: Secondary | ICD-10-CM | POA: Diagnosis not present

## 2022-08-29 DIAGNOSIS — Z510 Encounter for antineoplastic radiation therapy: Secondary | ICD-10-CM | POA: Diagnosis not present

## 2022-08-29 DIAGNOSIS — Z87891 Personal history of nicotine dependence: Secondary | ICD-10-CM | POA: Diagnosis not present

## 2022-08-29 LAB — RAD ONC ARIA SESSION SUMMARY
Course Elapsed Days: 33
Plan Fractions Treated to Date: 22
Plan Prescribed Dose Per Fraction: 2 Gy
Plan Total Fractions Prescribed: 40
Plan Total Prescribed Dose: 80 Gy
Reference Point Dosage Given to Date: 44 Gy
Reference Point Session Dosage Given: 2 Gy
Session Number: 22

## 2022-08-29 LAB — LIPOPROTEIN A (LPA): Lipoprotein (a): 63.1 nmol/L — ABNORMAL HIGH (ref <75.0)

## 2022-08-29 NOTE — Patient Instructions (Signed)
Medication Instructions:  Your physician recommends that you continue on your current medications as directed. Please refer to the Current Medication list given to you today.  *If you need a refill on your cardiac medications before your next appointment, please call your pharmacy*   Lab Work: Your physician recommends that you return for lab work (bmp) in: 1 week   Please have your lab drawn at the [facility]. No appt needed. Stop at the Registration desk to check in.  If you have labs (blood work) drawn today and your tests are completely normal, you will receive your results only by: [place] (if you have MyChart) OR A paper copy in the mail If you have any lab test that is abnormal or we need to change your treatment, we will call you to review the results.   Testing/Procedures: None ordered   Follow-Up: At [facility], you and your health needs are our priority.  As part of our continuing mission to provide you with exceptional heart care, we have created designated Provider Care Teams.  These Care Teams include your primary Cardiologist (physician) and Advanced Practice Providers (APPs -  Physician Assistants and Nurse Practitioners) who all work together to provide you with the care you need, when you need it.  We recommend signing up for the patient portal called "MyChart".  Sign up information is provided on this After Visit Summary.  MyChart is used to connect with patients for Virtual Visits (Telemedicine).  Patients are able to view lab/test results, encounter notes, upcoming appointments, etc.  Non-urgent messages can be sent to your provider as well.   To learn more about what you can do with MyChart, go to [email or web address].    Your next appointment:   3 month(s)  The format for your next appointment:   In Person  Provider:   You will see one of the following Advanced Practice Providers on your designated Care Team:   Cadence Kathlen Mody,  [place]  Other Instructions N/A  Important Information About Sugar

## 2022-08-29 NOTE — Progress Notes (Unsigned)
Cardiology Office Note:    Date:  08/29/2022   ID:  Bryan Strong, DOB [birth year], MRN [id number]  PCP:  Derinda Late, MD  [facility] Cardiologist:  None  [facility] Electrophysiologist:  Vickie Epley, MD   Referring MD: Derinda Late, MD   Chief Complaint: 1 week follow-up  History of Present Illness:    Bryan Strong is a 76 y.o. male with a hx of paroxysmal Afib, CAD s/p PCI, COPD, HLD, PAD, OSA, DM2, prostate cancer, multiple myeloma who is being seen for hospital follow-up.   H/o of CAD with RCA stenting in 2012. Most recent [facility] 05/2017 showed 75% OM1 lesion, 90% ostial D1 and patent RCA stent. Medical management was recommended. Echo 12/2021 showed normal biV function, no valvular disease.   Seen by Dr. Quentin Ore for syncope in 04/2022. A Loop recorder was placed.   On 9/7 he was noted to have 18 beat run of NSVT. He was called and recommended ED evaluation. In the ED, vital signs notable for BP 154/77, pulse 73, SPO2 100% on room air.  Labs notable for sodium 133, potassium 3.9, magnesium 1.1, WBC 3.9, hemoglobin 13.3, platelets 120, TSH 1.5.  EKG showed normal sinus rhythm, rate 100, no ST abnormalities.  Chest x-ray unremarkable. Due to NSVT he underwent heart cath showing patent distal RCA stent, mild branch vessel disease in the OM and diagonal. No lesions to explain NSVT. He was seen by EP who felt presyncope was likely hypotension. Less likely VT given loop tracing. Also abnormal electrolytes were contributing.   Today, the patient is doing radiation every day for prostate cancer. He is half way through treatment and feels tired from the radiation. He is taking potassium and magnesium. He denies chest pain. The patient denies further lightheadedness or dizziness. He denies chest pain and SOB. Cath site is healing well.   Past Medical History:  Diagnosis Date   A-fib ([place])    Achalasia of esophagus    Allergic rhinitis    Anemia    Anginal pain ([place])    Aortic  aneurysm ([place])    followed by Dr. Franchot Gallo   Coronary artery disease    Diabetes mellitus without complication ([place])    Dyspnea    Emphysema of lung ([facility])    GERD (gastroesophageal reflux disease)    Heart murmur    Hypercholesteremia    Hypertension    Kidney stones    Skin cancer    Sleep apnea    CPAP   Wears dentures    full upper and lower    Past Surgical History:  Procedure Laterality Date   CHOLECYSTECTOMY     colon polyps     COLONOSCOPY WITH PROPOFOL N/A 09/18/2017   Procedure: COLONOSCOPY WITH PROPOFOL;  Surgeon: Manya Silvas, MD;  Location: [facility] ENDOSCOPY;  Service: Endoscopy;  Laterality: N/A;   CORONARY ANGIOPLASTY WITH STENT PLACEMENT  01/03/11   [facility]  Dr. Saralyn Pilar   ECTROPION REPAIR Bilateral 05/03/2016   Procedure: REPAIR OF ECTROPION BILATERAL LOWER EYELIDS;  Surgeon: Karle Starch, MD;  Location: [place];  Service: Ophthalmology;  Laterality: Bilateral;   ESOPHAGOGASTRODUODENOSCOPY (EGD) WITH PROPOFOL N/A 07/21/2017   Procedure: ESOPHAGOGASTRODUODENOSCOPY (EGD) WITH PROPOFOL;  Surgeon: Manya Silvas, MD;  Location: [facility] ENDOSCOPY;  Service: Endoscopy;  Laterality: N/A;   ESOPHAGOGASTRODUODENOSCOPY (EGD) WITH PROPOFOL Bilateral 10/18/2017   Procedure: ESOPHAGOGASTRODUODENOSCOPY (EGD) WITH PROPOFOL;  Surgeon: Manya Silvas, MD;  Location: [facility] ENDOSCOPY;  Service: Endoscopy;  Laterality: Bilateral;  HERNIA REPAIR     LEFT HEART CATH Left 06/08/2017   Procedure: Left Heart Cath and Coronary Angiography;  Surgeon: Yolonda Kida, MD;  Location: [place] CV LAB;  Service: Cardiovascular;  Laterality: Left;   LEFT HEART CATH AND CORONARY ANGIOGRAPHY N/A 08/26/2022   Procedure: LEFT HEART CATH AND CORONARY ANGIOGRAPHY;  Surgeon: Jettie Booze, MD;  Location: [place] CV LAB;  Service: Cardiovascular;  Laterality: N/A;   LID LESION EXCISION Bilateral 05/03/2016   Procedure: ,EXCISION CYST RIGHT UPPER EYELID;  Surgeon: Karle Starch,  MD;  Location: [place];  Service: Ophthalmology;  Laterality: Bilateral;  Diabetic - oral meds CPAP   NASAL SEPTUM SURGERY  1967   SAVORY DILATION N/A 07/21/2017   Procedure: SAVORY DILATION;  Surgeon: Manya Silvas, MD;  Location: [facility] ENDOSCOPY;  Service: Endoscopy;  Laterality: N/A;    Current Medications: Current Meds  Medication Sig   ascorbic acid (VITAMIN C) 500 MG tablet Take 500 mg by mouth daily.   azelastine (ASTELIN) 0.1 % nasal spray Place 1 spray into both nostrils 2 (two) times daily. Use in each nostril as directed    calcium carbonate (OS-CAL) 1250 (500 Ca) MG chewable tablet Chew 2 tablets by mouth daily as needed.   cetirizine (ZYRTEC) 10 MG tablet Take 10 mg by mouth daily.   Cholecalciferol 25 MCG (1000 UT) capsule Take 1,000 Units by mouth daily.   clopidogrel (PLAVIX) 75 MG tablet Take 1 tablet by mouth daily.   Flaxseed, Linseed, (FLAXSEED OIL PO) Take 1 tablet by mouth 2 (two) times daily.    fluticasone (FLONASE) 50 MCG/ACT nasal spray Place 1 spray into both nostrils 2 (two) times daily.    glipiZIDE (GLUCOTROL XL) 10 MG 24 hr tablet Take 1 tablet by mouth daily.   lisinopril (ZESTRIL) 20 MG tablet Take 20 mg by mouth 2 (two) times daily.   magnesium oxide (MAG-OX) 400 (240 Mg) MG tablet Take 1 tablet (400 mg total) by mouth daily.   metFORMIN (GLUCOPHAGE) 1000 MG tablet Take 1,000 mg by mouth 2 (two) times daily with a meal.   metoprolol tartrate (LOPRESSOR) 25 MG tablet Take 12.5 mg by mouth 2 (two) times daily.   Multiple Vitamin (MULTIVITAMIN WITH MINERALS) TABS tablet Take 1 tablet by mouth daily.   pantoprazole (PROTONIX) 40 MG tablet Take 40 mg by mouth daily before breakfast.    Polyethyl Glycol-Propyl Glycol (SYSTANE ULTRA OP) Apply 2 drops to eye in the morning and at bedtime.   potassium chloride SA (KLOR-CON M) 20 MEQ tablet Take 1 tablet (20 mEq total) by mouth daily.   simvastatin (ZOCOR) 40 MG tablet Take 1 tablet by mouth at bedtime.    tamsulosin (FLOMAX) 0.4 MG CAPS capsule Take 1 capsule (0.4 mg total) by mouth daily.     Allergies:   Erythromycin base and Other   Social History   Socioeconomic History   Marital status: Married    Spouse name: Not on file   Number of children: Not on file   Years of education: Not on file   Highest education level: Not on file  Occupational History   Not on file  Tobacco Use   Smoking status: Former    Packs/day: 1.00    Years: 56.00    Total pack years: 56.00    Types: Cigarettes    Quit date: 11/04/2015    Years since quitting: 6.8   Smokeless tobacco: Never  Vaping Use   Vaping Use:  Never used  Substance and Sexual Activity   Alcohol use: No   Drug use: No   Sexual activity: Not on file  Other Topics Concern   Not on file  Social History Narrative   Not on file   Social Determinants of Health   Financial Resource Strain: Not on file  Food Insecurity: Not on file  Transportation Needs: Not on file  Physical Activity: Not on file  Stress: Not on file  Social Connections: Not on file     Family History: The patient's family history includes Diabetes in his brother, maternal aunt, maternal grandmother, and maternal uncle.  ROS:   Please see the history of present illness.     All other systems reviewed and are negative.  EKGs/Labs/Other Studies Reviewed:    The following studies were reviewed today:  LHC 08/26/22     Ost RCA lesion is 10% stenosed.  No pressure dampening with catheter engagement.   1st Mrg lesion is 50% stenosed.   Mid RCA lesion is 25% stenosed.   1st Diag lesion is 25% stenosed.   Previously placed Dist RCA stent of unknown type is  widely patent.   The left ventricular systolic function is normal.   LV end diastolic pressure is normal.   The left ventricular ejection fraction is 55-65% by visual estimate.   There is no aortic valve stenosis.   Patent distal RCA stent.  Mild, branch vessel disease in the OM and diagonal.  Both  lesions appeared improved compared to 2018 catheterization.  Minimal luminal irregularities in the LAD.  No lesions to explain nonsustained ventricular tachycardia.   Further plans per EP.  Echo 08/26/22 1. Left ventricular ejection fraction, by estimation, is 60 to 65%. The  left ventricle has normal function. The left ventricle has no regional  wall motion abnormalities. There is moderate concentric left ventricular  hypertrophy. Left ventricular  diastolic parameters are consistent with Grade I diastolic dysfunction  (impaired relaxation).   2. Right ventricular systolic function is normal. The right ventricular  size is normal.   3. A small pericardial effusion is present.   4. The mitral valve is normal in structure. Trivial mitral valve  regurgitation. No evidence of mitral stenosis.   5. The aortic valve is tricuspid. Aortic valve regurgitation is not  visualized. Aortic valve sclerosis is present, with no evidence of aortic  valve stenosis.   6. Aortic dilatation noted. There is borderline dilatation of the aortic  root, measuring 39 mm.   7. The inferior vena cava is normal in size with greater than 50%  respiratory variability, suggesting right atrial pressure of 3 mmHg.     EKG:  EKG is ordered today.  The ekg ordered today demonstrates NSR 64bpm, nonspecific T wave changes  Recent Labs: 08/25/2022: TSH 1.525 08/26/2022: Hemoglobin 12.5; Platelets 105 08/27/2022: BUN 10; Creatinine, Ser 0.86; Magnesium 1.9; Potassium 3.6; Sodium 136  Recent Lipid Panel No results found for: "CHOL", "TRIG", "HDL", "CHOLHDL", "VLDL", "LDLCALC", "LDLDIRECT"  Physical Exam:    VS:  BP 134/70 (BP Location: Left Arm, Patient Position: Sitting, Cuff Size: Normal)   Pulse 64   Ht '5\' 10"'  (1.778 m)   Wt 192 lb 12.8 oz (87.5 kg)   SpO2 98%   BMI 27.66 kg/m     Wt Readings from Last 3 Encounters:  08/29/22 192 lb 12.8 oz (87.5 kg)  08/27/22 180 lb 4.8 oz (81.8 kg)  07/15/22 190 lb (86.2 kg)  GEN:  Well nourished, well developed in no acute distress HEENT: Normal NECK: No JVD; No carotid bruits LYMPHATICS: No lymphadenopathy CARDIAC: RRR, no murmurs, rubs, gallops RESPIRATORY:  Clear to auscultation without rales, wheezing or rhonchi  ABDOMEN: Soft, non-tender, non-distended MUSCULOSKELETAL:  No edema; No deformity  SKIN: Warm and dry NEUROLOGIC:  Alert and oriented x 3 PSYCHIATRIC:  Normal affect   ASSESSMENT:    1. Pre-syncope   2. Wide-complex tachycardia   3. Coronary artery disease involving native coronary artery of native heart without angina pectoris   4. Essential hypertension   5. Hyperlipidemia, mixed    PLAN:    In order of problems listed above:  Presyncope Wide Complex tachycardia Patient had an episode of lightheadedness and loop recorder noted 18 beats of NSVT and he was told to go to the ER. K and Mag were low. LHC showed stable disease with no culprit lesions. EP saw and suspected hypotension in the setting of SVT and electrolyte derangements. He has been taking Mag and potassium. I will check a BMET/Mag in a week. He denies further pre-syncope and synopce. BP is good today.   CAD with RCA stenting in 2012 Stable disease on recent heart cath. Cath site, right radial, is stable. LHC showed no culprit lesion as above. Patient denies anginal symptoms. No further ischemic work-up indicated at this time. Continue Plavix, statin, Lopressor and Lisinopril.   HTN BP is good today. Continue Lisinopril 3m daily and Lopressor 12.550mID.   HLD LDL 56. Continue simvastatin   Disposition: Follow up in 3 month(s) with MD/APP     Signed, Arik Husmann H Ninfa MeekerPA-C  08/29/2022 3:28 PM    [facility]

## 2022-08-30 ENCOUNTER — Other Ambulatory Visit: Payer: Self-pay

## 2022-08-30 ENCOUNTER — Ambulatory Visit
Admission: RE | Admit: 2022-08-30 | Discharge: 2022-08-30 | Disposition: A | Discharge status: Home / Self Care | Payer: PPO | Source: Ambulatory Visit | Attending: Radiation Oncology | Admitting: Radiation Oncology

## 2022-08-30 ENCOUNTER — Telehealth: Payer: Self-pay | Admitting: Cardiology

## 2022-08-30 DIAGNOSIS — C61 Malignant neoplasm of prostate: Secondary | ICD-10-CM | POA: Diagnosis not present

## 2022-08-30 DIAGNOSIS — I480 Paroxysmal atrial fibrillation: Secondary | ICD-10-CM

## 2022-08-30 DIAGNOSIS — Z191 Hormone sensitive malignancy status: Secondary | ICD-10-CM | POA: Diagnosis not present

## 2022-08-30 DIAGNOSIS — Z510 Encounter for antineoplastic radiation therapy: Secondary | ICD-10-CM | POA: Diagnosis not present

## 2022-08-30 LAB — RAD ONC ARIA SESSION SUMMARY
Course Elapsed Days: 34
Plan Fractions Treated to Date: 23
Plan Prescribed Dose Per Fraction: 2 Gy
Plan Total Fractions Prescribed: 40
Plan Total Prescribed Dose: 80 Gy
Reference Point Dosage Given to Date: 46 Gy
Reference Point Session Dosage Given: 2 Gy
Session Number: 23

## 2022-08-30 LAB — CUP PACEART REMOTE DEVICE CHECK
Date Time Interrogation Session: 20230911080108
Implantable Pulse Generator Implant Date: 20230531
Pulse Gen Serial Number: 183770

## 2022-08-30 NOTE — Telephone Encounter (Signed)
Pt c/o medication issue:  1. Name of Medication: magnesium oxide (MAG-OX) 400 (240 Mg) MG tablet  2. How are you currently taking this medication (dosage and times per day)? Take 1 tablet (400 mg total) by mouth daily.  3. Are you having a reaction (difficulty breathing--STAT)? no  4. What is your medication issue? Patient was recently in the hospital, he was prescribed this medication (along with potassium), he wants to know if he has to take it.  He was told by the pharmacist it can cause loose bowels.  He states he is already have bowel issues as he is currently having radiation treatments for his prostate cancer. Please advise.

## 2022-08-31 ENCOUNTER — Ambulatory Visit
Admission: RE | Admit: 2022-08-31 | Discharge: 2022-08-31 | Disposition: A | Discharge status: Home / Self Care | Payer: PPO | Source: Ambulatory Visit | Attending: Radiation Oncology | Admitting: Radiation Oncology

## 2022-08-31 ENCOUNTER — Other Ambulatory Visit: Payer: Self-pay

## 2022-08-31 DIAGNOSIS — C61 Malignant neoplasm of prostate: Secondary | ICD-10-CM | POA: Diagnosis not present

## 2022-08-31 DIAGNOSIS — Z510 Encounter for antineoplastic radiation therapy: Secondary | ICD-10-CM | POA: Diagnosis not present

## 2022-08-31 DIAGNOSIS — Z191 Hormone sensitive malignancy status: Secondary | ICD-10-CM | POA: Diagnosis not present

## 2022-08-31 LAB — RAD ONC ARIA SESSION SUMMARY
Course Elapsed Days: 35
Plan Fractions Treated to Date: 24
Plan Prescribed Dose Per Fraction: 2 Gy
Plan Total Fractions Prescribed: 40
Plan Total Prescribed Dose: 80 Gy
Reference Point Dosage Given to Date: 48 Gy
Reference Point Session Dosage Given: 2 Gy
Session Number: 24

## 2022-09-01 ENCOUNTER — Other Ambulatory Visit: Payer: Self-pay

## 2022-09-01 ENCOUNTER — Ambulatory Visit
Admission: RE | Admit: 2022-09-01 | Discharge: 2022-09-01 | Disposition: A | Discharge status: Home / Self Care | Payer: PPO | Source: Ambulatory Visit | Attending: Radiation Oncology | Admitting: Radiation Oncology

## 2022-09-01 DIAGNOSIS — Z191 Hormone sensitive malignancy status: Secondary | ICD-10-CM | POA: Diagnosis not present

## 2022-09-01 DIAGNOSIS — Z510 Encounter for antineoplastic radiation therapy: Secondary | ICD-10-CM | POA: Diagnosis not present

## 2022-09-01 DIAGNOSIS — C61 Malignant neoplasm of prostate: Secondary | ICD-10-CM | POA: Diagnosis not present

## 2022-09-01 LAB — RAD ONC ARIA SESSION SUMMARY
Course Elapsed Days: 36
Plan Fractions Treated to Date: 25
Plan Prescribed Dose Per Fraction: 2 Gy
Plan Total Fractions Prescribed: 40
Plan Total Prescribed Dose: 80 Gy
Reference Point Dosage Given to Date: 50 Gy
Reference Point Session Dosage Given: 2 Gy
Session Number: 25

## 2022-09-02 ENCOUNTER — Other Ambulatory Visit: Payer: Self-pay

## 2022-09-02 ENCOUNTER — Ambulatory Visit
Admission: RE | Admit: 2022-09-02 | Discharge: 2022-09-02 | Disposition: A | Discharge status: Home / Self Care | Payer: PPO | Source: Ambulatory Visit | Attending: Radiation Oncology | Admitting: Radiation Oncology

## 2022-09-02 ENCOUNTER — Ambulatory Visit: Payer: PPO | Admitting: Student

## 2022-09-02 DIAGNOSIS — Z191 Hormone sensitive malignancy status: Secondary | ICD-10-CM | POA: Diagnosis not present

## 2022-09-02 DIAGNOSIS — Z510 Encounter for antineoplastic radiation therapy: Secondary | ICD-10-CM | POA: Diagnosis not present

## 2022-09-02 DIAGNOSIS — C61 Malignant neoplasm of prostate: Secondary | ICD-10-CM | POA: Diagnosis not present

## 2022-09-02 LAB — RAD ONC ARIA SESSION SUMMARY
Course Elapsed Days: 37
Plan Fractions Treated to Date: 26
Plan Prescribed Dose Per Fraction: 2 Gy
Plan Total Fractions Prescribed: 40
Plan Total Prescribed Dose: 80 Gy
Reference Point Dosage Given to Date: 52 Gy
Reference Point Session Dosage Given: 2 Gy
Session Number: 26

## 2022-09-02 NOTE — Telephone Encounter (Signed)
Furth, Cadence H, PA-C  Yes, he should take it. It takes a while to replete Mag orally. He may only need it for 2-3 months   Patient verbalized understanding.

## 2022-09-05 ENCOUNTER — Other Ambulatory Visit: Payer: Self-pay

## 2022-09-05 ENCOUNTER — Other Ambulatory Visit
Admission: RE | Admit: 2022-09-05 | Discharge: 2022-09-05 | Disposition: A | Discharge status: Home / Self Care | Payer: PPO | Attending: Medical | Admitting: Medical

## 2022-09-05 ENCOUNTER — Ambulatory Visit
Admission: RE | Admit: 2022-09-05 | Discharge: 2022-09-05 | Disposition: A | Discharge status: Home / Self Care | Payer: PPO | Source: Ambulatory Visit | Attending: Radiation Oncology | Admitting: Radiation Oncology

## 2022-09-05 DIAGNOSIS — E1159 Type 2 diabetes mellitus with other circulatory complications: Secondary | ICD-10-CM | POA: Diagnosis not present

## 2022-09-05 DIAGNOSIS — I4729 Other ventricular tachycardia: Secondary | ICD-10-CM | POA: Diagnosis not present

## 2022-09-05 DIAGNOSIS — I480 Paroxysmal atrial fibrillation: Secondary | ICD-10-CM | POA: Insufficient documentation

## 2022-09-05 DIAGNOSIS — E876 Hypokalemia: Secondary | ICD-10-CM | POA: Diagnosis not present

## 2022-09-05 DIAGNOSIS — I152 Hypertension secondary to endocrine disorders: Secondary | ICD-10-CM | POA: Diagnosis not present

## 2022-09-05 DIAGNOSIS — C61 Malignant neoplasm of prostate: Secondary | ICD-10-CM | POA: Diagnosis not present

## 2022-09-05 DIAGNOSIS — I2510 Atherosclerotic heart disease of native coronary artery without angina pectoris: Secondary | ICD-10-CM | POA: Insufficient documentation

## 2022-09-05 DIAGNOSIS — Z510 Encounter for antineoplastic radiation therapy: Secondary | ICD-10-CM | POA: Diagnosis not present

## 2022-09-05 DIAGNOSIS — Z191 Hormone sensitive malignancy status: Secondary | ICD-10-CM | POA: Diagnosis not present

## 2022-09-05 LAB — RAD ONC ARIA SESSION SUMMARY
Course Elapsed Days: 40
Plan Fractions Treated to Date: 27
Plan Prescribed Dose Per Fraction: 2 Gy
Plan Total Fractions Prescribed: 40
Plan Total Prescribed Dose: 80 Gy
Reference Point Dosage Given to Date: 54 Gy
Reference Point Session Dosage Given: 2 Gy
Session Number: 27

## 2022-09-05 LAB — BASIC METABOLIC PANEL
Anion gap: 6 (ref 5–15)
BUN: 10 mg/dL (ref 8–23)
CO2: 28 mmol/L (ref 22–32)
Calcium: 9.5 mg/dL (ref 8.9–10.3)
Chloride: 103 mmol/L (ref 98–111)
Creatinine, Ser: 0.93 mg/dL (ref 0.61–1.24)
GFR, Estimated: >60 mL/min (ref >60)
Glucose, Bld: 153 mg/dL — ABNORMAL HIGH (ref 70–99)
Potassium: 4.9 mmol/L (ref 3.5–5.1)
Sodium: 137 mmol/L (ref 135–145)

## 2022-09-05 LAB — MAGNESIUM: Magnesium: 1.5 mg/dL — ABNORMAL LOW (ref 1.7–2.4)

## 2022-09-06 ENCOUNTER — Inpatient Hospital Stay: Payer: PPO

## 2022-09-06 ENCOUNTER — Other Ambulatory Visit: Payer: Self-pay

## 2022-09-06 ENCOUNTER — Other Ambulatory Visit: Payer: Self-pay | Admitting: *Deleted

## 2022-09-06 ENCOUNTER — Ambulatory Visit
Admission: RE | Admit: 2022-09-06 | Discharge: 2022-09-06 | Disposition: A | Discharge status: Home / Self Care | Payer: PPO | Source: Ambulatory Visit | Attending: Radiation Oncology | Admitting: Radiation Oncology

## 2022-09-06 ENCOUNTER — Other Ambulatory Visit: Payer: Self-pay | Admitting: Radiation Oncology

## 2022-09-06 DIAGNOSIS — Z191 Hormone sensitive malignancy status: Secondary | ICD-10-CM | POA: Diagnosis not present

## 2022-09-06 DIAGNOSIS — Z510 Encounter for antineoplastic radiation therapy: Secondary | ICD-10-CM | POA: Diagnosis not present

## 2022-09-06 DIAGNOSIS — C61 Malignant neoplasm of prostate: Secondary | ICD-10-CM

## 2022-09-06 LAB — RAD ONC ARIA SESSION SUMMARY
Course Elapsed Days: 41
Plan Fractions Treated to Date: 28
Plan Prescribed Dose Per Fraction: 2 Gy
Plan Total Fractions Prescribed: 40
Plan Total Prescribed Dose: 80 Gy
Reference Point Dosage Given to Date: 56 Gy
Reference Point Session Dosage Given: 2 Gy
Session Number: 28

## 2022-09-06 LAB — CBC
HCT: 36.8 % — ABNORMAL LOW (ref 39.0–52.0)
Hemoglobin: 12.9 g/dL — ABNORMAL LOW (ref 13.0–17.0)
MCH: 32 pg (ref 26.0–34.0)
MCHC: 35.1 g/dL (ref 30.0–36.0)
MCV: 91.3 fL (ref 80.0–100.0)
Platelets: 129 10*3/uL — ABNORMAL LOW (ref 150–400)
RBC: 4.03 MIL/uL — ABNORMAL LOW (ref 4.22–5.81)
RDW: 14.5 % (ref 11.5–15.5)
WBC: 3.7 10*3/uL — ABNORMAL LOW (ref 4.0–10.5)
nRBC: 0 % (ref 0.0–0.2)

## 2022-09-06 MED ORDER — TAMSULOSIN HCL 0.4 MG PO CAPS: 0.4000 mg | ORAL_CAPSULE | Freq: Every day | ORAL | 3 refills | Status: DC

## 2022-09-07 ENCOUNTER — Other Ambulatory Visit: Payer: Self-pay

## 2022-09-07 ENCOUNTER — Ambulatory Visit
Admission: RE | Admit: 2022-09-07 | Discharge: 2022-09-07 | Disposition: A | Discharge status: Home / Self Care | Payer: PPO | Source: Ambulatory Visit | Attending: Radiation Oncology | Admitting: Radiation Oncology

## 2022-09-07 DIAGNOSIS — Z510 Encounter for antineoplastic radiation therapy: Secondary | ICD-10-CM | POA: Diagnosis not present

## 2022-09-07 DIAGNOSIS — Z191 Hormone sensitive malignancy status: Secondary | ICD-10-CM | POA: Diagnosis not present

## 2022-09-07 DIAGNOSIS — C61 Malignant neoplasm of prostate: Secondary | ICD-10-CM | POA: Diagnosis not present

## 2022-09-07 LAB — RAD ONC ARIA SESSION SUMMARY
Course Elapsed Days: 42
Plan Fractions Treated to Date: 29
Plan Prescribed Dose Per Fraction: 2 Gy
Plan Total Fractions Prescribed: 40
Plan Total Prescribed Dose: 80 Gy
Reference Point Dosage Given to Date: 58 Gy
Reference Point Session Dosage Given: 2 Gy
Session Number: 29

## 2022-09-08 ENCOUNTER — Other Ambulatory Visit: Payer: Self-pay

## 2022-09-08 ENCOUNTER — Ambulatory Visit
Admission: RE | Admit: 2022-09-08 | Discharge: 2022-09-08 | Disposition: A | Discharge status: Home / Self Care | Payer: PPO | Source: Ambulatory Visit | Attending: Radiation Oncology | Admitting: Radiation Oncology

## 2022-09-08 DIAGNOSIS — Z510 Encounter for antineoplastic radiation therapy: Secondary | ICD-10-CM | POA: Diagnosis not present

## 2022-09-08 DIAGNOSIS — Z191 Hormone sensitive malignancy status: Secondary | ICD-10-CM | POA: Diagnosis not present

## 2022-09-08 DIAGNOSIS — C61 Malignant neoplasm of prostate: Secondary | ICD-10-CM | POA: Diagnosis not present

## 2022-09-08 LAB — RAD ONC ARIA SESSION SUMMARY
Course Elapsed Days: 43
Plan Fractions Treated to Date: 30
Plan Prescribed Dose Per Fraction: 2 Gy
Plan Total Fractions Prescribed: 40
Plan Total Prescribed Dose: 80 Gy
Reference Point Dosage Given to Date: 60 Gy
Reference Point Session Dosage Given: 2 Gy
Session Number: 30

## 2022-09-09 ENCOUNTER — Other Ambulatory Visit: Payer: Self-pay

## 2022-09-09 ENCOUNTER — Ambulatory Visit
Admission: RE | Admit: 2022-09-09 | Discharge: 2022-09-09 | Disposition: A | Discharge status: Home / Self Care | Payer: PPO | Source: Ambulatory Visit | Attending: Radiation Oncology | Admitting: Radiation Oncology

## 2022-09-09 DIAGNOSIS — Z510 Encounter for antineoplastic radiation therapy: Secondary | ICD-10-CM | POA: Diagnosis not present

## 2022-09-09 DIAGNOSIS — Z191 Hormone sensitive malignancy status: Secondary | ICD-10-CM | POA: Diagnosis not present

## 2022-09-09 DIAGNOSIS — C61 Malignant neoplasm of prostate: Secondary | ICD-10-CM | POA: Diagnosis not present

## 2022-09-09 LAB — RAD ONC ARIA SESSION SUMMARY
Course Elapsed Days: 44
Plan Fractions Treated to Date: 31
Plan Prescribed Dose Per Fraction: 2 Gy
Plan Total Fractions Prescribed: 40
Plan Total Prescribed Dose: 80 Gy
Reference Point Dosage Given to Date: 62 Gy
Reference Point Session Dosage Given: 2 Gy
Session Number: 31

## 2022-09-12 ENCOUNTER — Ambulatory Visit
Admission: RE | Admit: 2022-09-12 | Discharge: 2022-09-12 | Disposition: A | Discharge status: Home / Self Care | Payer: PPO | Source: Ambulatory Visit | Attending: Radiation Oncology | Admitting: Radiation Oncology

## 2022-09-12 ENCOUNTER — Other Ambulatory Visit: Payer: Self-pay

## 2022-09-12 DIAGNOSIS — Z510 Encounter for antineoplastic radiation therapy: Secondary | ICD-10-CM | POA: Diagnosis not present

## 2022-09-12 DIAGNOSIS — C61 Malignant neoplasm of prostate: Secondary | ICD-10-CM | POA: Diagnosis not present

## 2022-09-12 DIAGNOSIS — Z191 Hormone sensitive malignancy status: Secondary | ICD-10-CM | POA: Diagnosis not present

## 2022-09-12 LAB — RAD ONC ARIA SESSION SUMMARY
Course Elapsed Days: 47
Plan Fractions Treated to Date: 32
Plan Prescribed Dose Per Fraction: 2 Gy
Plan Total Fractions Prescribed: 40
Plan Total Prescribed Dose: 80 Gy
Reference Point Dosage Given to Date: 64 Gy
Reference Point Session Dosage Given: 2 Gy
Session Number: 32

## 2022-09-13 ENCOUNTER — Ambulatory Visit
Admission: RE | Admit: 2022-09-13 | Discharge: 2022-09-13 | Disposition: A | Discharge status: Home / Self Care | Payer: PPO | Source: Ambulatory Visit | Attending: Radiation Oncology | Admitting: Radiation Oncology

## 2022-09-13 ENCOUNTER — Encounter: Payer: Self-pay | Admitting: Cardiology

## 2022-09-13 ENCOUNTER — Other Ambulatory Visit: Payer: Self-pay

## 2022-09-13 DIAGNOSIS — Z191 Hormone sensitive malignancy status: Secondary | ICD-10-CM | POA: Diagnosis not present

## 2022-09-13 DIAGNOSIS — C61 Malignant neoplasm of prostate: Secondary | ICD-10-CM | POA: Diagnosis not present

## 2022-09-13 DIAGNOSIS — Z510 Encounter for antineoplastic radiation therapy: Secondary | ICD-10-CM | POA: Diagnosis not present

## 2022-09-13 LAB — RAD ONC ARIA SESSION SUMMARY
Course Elapsed Days: 48
Plan Fractions Treated to Date: 33
Plan Prescribed Dose Per Fraction: 2 Gy
Plan Total Fractions Prescribed: 40
Plan Total Prescribed Dose: 80 Gy
Reference Point Dosage Given to Date: 66 Gy
Reference Point Session Dosage Given: 2 Gy
Session Number: 33

## 2022-09-14 ENCOUNTER — Other Ambulatory Visit: Payer: Self-pay

## 2022-09-14 ENCOUNTER — Ambulatory Visit
Admission: RE | Admit: 2022-09-14 | Discharge: 2022-09-14 | Disposition: A | Discharge status: Home / Self Care | Payer: PPO | Source: Ambulatory Visit | Attending: Radiation Oncology | Admitting: Radiation Oncology

## 2022-09-14 DIAGNOSIS — Z510 Encounter for antineoplastic radiation therapy: Secondary | ICD-10-CM | POA: Diagnosis not present

## 2022-09-14 DIAGNOSIS — Z191 Hormone sensitive malignancy status: Secondary | ICD-10-CM | POA: Diagnosis not present

## 2022-09-14 DIAGNOSIS — C61 Malignant neoplasm of prostate: Secondary | ICD-10-CM | POA: Diagnosis not present

## 2022-09-14 LAB — RAD ONC ARIA SESSION SUMMARY
Course Elapsed Days: 49
Plan Fractions Treated to Date: 34
Plan Prescribed Dose Per Fraction: 2 Gy
Plan Total Fractions Prescribed: 40
Plan Total Prescribed Dose: 80 Gy
Reference Point Dosage Given to Date: 68 Gy
Reference Point Session Dosage Given: 2 Gy
Session Number: 34

## 2022-09-15 ENCOUNTER — Ambulatory Visit
Admission: RE | Admit: 2022-09-15 | Discharge: 2022-09-15 | Disposition: A | Discharge status: Home / Self Care | Payer: PPO | Source: Ambulatory Visit | Attending: Radiation Oncology | Admitting: Radiation Oncology

## 2022-09-15 ENCOUNTER — Other Ambulatory Visit: Payer: Self-pay

## 2022-09-15 DIAGNOSIS — Z191 Hormone sensitive malignancy status: Secondary | ICD-10-CM | POA: Diagnosis not present

## 2022-09-15 DIAGNOSIS — C61 Malignant neoplasm of prostate: Secondary | ICD-10-CM | POA: Diagnosis not present

## 2022-09-15 DIAGNOSIS — Z510 Encounter for antineoplastic radiation therapy: Secondary | ICD-10-CM | POA: Diagnosis not present

## 2022-09-15 LAB — RAD ONC ARIA SESSION SUMMARY
Course Elapsed Days: 50
Plan Fractions Treated to Date: 35
Plan Prescribed Dose Per Fraction: 2 Gy
Plan Total Fractions Prescribed: 40
Plan Total Prescribed Dose: 80 Gy
Reference Point Dosage Given to Date: 70 Gy
Reference Point Session Dosage Given: 2 Gy
Session Number: 35

## 2022-09-15 NOTE — Progress Notes (Signed)
Carelink Summary Report / Loop Recorder 

## 2022-09-16 ENCOUNTER — Other Ambulatory Visit: Payer: Self-pay

## 2022-09-16 ENCOUNTER — Ambulatory Visit
Admission: RE | Admit: 2022-09-16 | Discharge: 2022-09-16 | Disposition: A | Discharge status: Home / Self Care | Payer: PPO | Source: Ambulatory Visit | Attending: Radiation Oncology | Admitting: Radiation Oncology

## 2022-09-16 DIAGNOSIS — C61 Malignant neoplasm of prostate: Secondary | ICD-10-CM | POA: Diagnosis not present

## 2022-09-16 DIAGNOSIS — Z191 Hormone sensitive malignancy status: Secondary | ICD-10-CM | POA: Diagnosis not present

## 2022-09-16 DIAGNOSIS — Z510 Encounter for antineoplastic radiation therapy: Secondary | ICD-10-CM | POA: Diagnosis not present

## 2022-09-16 LAB — RAD ONC ARIA SESSION SUMMARY
Course Elapsed Days: 51
Plan Fractions Treated to Date: 36
Plan Prescribed Dose Per Fraction: 2 Gy
Plan Total Fractions Prescribed: 40
Plan Total Prescribed Dose: 80 Gy
Reference Point Dosage Given to Date: 72 Gy
Reference Point Session Dosage Given: 2 Gy
Session Number: 36

## 2022-09-19 ENCOUNTER — Ambulatory Visit
Admission: RE | Admit: 2022-09-19 | Discharge: 2022-09-19 | Disposition: A | Discharge status: Home / Self Care | Payer: PPO | Source: Ambulatory Visit | Attending: Radiation Oncology | Admitting: Radiation Oncology

## 2022-09-19 ENCOUNTER — Other Ambulatory Visit: Payer: Self-pay

## 2022-09-19 DIAGNOSIS — Z191 Hormone sensitive malignancy status: Secondary | ICD-10-CM | POA: Diagnosis not present

## 2022-09-19 DIAGNOSIS — Z510 Encounter for antineoplastic radiation therapy: Secondary | ICD-10-CM | POA: Diagnosis not present

## 2022-09-19 DIAGNOSIS — C61 Malignant neoplasm of prostate: Secondary | ICD-10-CM | POA: Insufficient documentation

## 2022-09-19 DIAGNOSIS — Z87891 Personal history of nicotine dependence: Secondary | ICD-10-CM | POA: Diagnosis not present

## 2022-09-19 LAB — RAD ONC ARIA SESSION SUMMARY
Course Elapsed Days: 54
Plan Fractions Treated to Date: 37
Plan Prescribed Dose Per Fraction: 2 Gy
Plan Total Fractions Prescribed: 40
Plan Total Prescribed Dose: 80 Gy
Reference Point Dosage Given to Date: 74 Gy
Reference Point Session Dosage Given: 2 Gy
Session Number: 37

## 2022-09-20 ENCOUNTER — Inpatient Hospital Stay: Payer: PPO

## 2022-09-20 ENCOUNTER — Ambulatory Visit
Admission: RE | Admit: 2022-09-20 | Discharge: 2022-09-20 | Disposition: A | Discharge status: Home / Self Care | Payer: PPO | Source: Ambulatory Visit | Attending: Radiation Oncology | Admitting: Radiation Oncology

## 2022-09-20 ENCOUNTER — Other Ambulatory Visit: Payer: Self-pay

## 2022-09-20 DIAGNOSIS — C61 Malignant neoplasm of prostate: Secondary | ICD-10-CM | POA: Insufficient documentation

## 2022-09-20 DIAGNOSIS — Z510 Encounter for antineoplastic radiation therapy: Secondary | ICD-10-CM | POA: Diagnosis not present

## 2022-09-20 DIAGNOSIS — Z191 Hormone sensitive malignancy status: Secondary | ICD-10-CM | POA: Diagnosis not present

## 2022-09-20 LAB — RAD ONC ARIA SESSION SUMMARY
Course Elapsed Days: 55
Plan Fractions Treated to Date: 38
Plan Prescribed Dose Per Fraction: 2 Gy
Plan Total Fractions Prescribed: 40
Plan Total Prescribed Dose: 80 Gy
Reference Point Dosage Given to Date: 76 Gy
Reference Point Session Dosage Given: 2 Gy
Session Number: 38

## 2022-09-20 LAB — CBC
HCT: 37.6 % — ABNORMAL LOW (ref 39.0–52.0)
Hemoglobin: 12.9 g/dL — ABNORMAL LOW (ref 13.0–17.0)
MCH: 31.8 pg (ref 26.0–34.0)
MCHC: 34.3 g/dL (ref 30.0–36.0)
MCV: 92.6 fL (ref 80.0–100.0)
Platelets: 109 10*3/uL — ABNORMAL LOW (ref 150–400)
RBC: 4.06 MIL/uL — ABNORMAL LOW (ref 4.22–5.81)
RDW: 14.7 % (ref 11.5–15.5)
WBC: 3.9 10*3/uL — ABNORMAL LOW (ref 4.0–10.5)
nRBC: 0 % (ref 0.0–0.2)

## 2022-09-21 ENCOUNTER — Other Ambulatory Visit: Payer: Self-pay

## 2022-09-21 ENCOUNTER — Ambulatory Visit
Admission: RE | Admit: 2022-09-21 | Discharge: 2022-09-21 | Disposition: A | Discharge status: Home / Self Care | Payer: PPO | Source: Ambulatory Visit | Attending: Radiation Oncology | Admitting: Radiation Oncology

## 2022-09-21 ENCOUNTER — Ambulatory Visit: Payer: PPO

## 2022-09-21 DIAGNOSIS — Z191 Hormone sensitive malignancy status: Secondary | ICD-10-CM | POA: Diagnosis not present

## 2022-09-21 DIAGNOSIS — C61 Malignant neoplasm of prostate: Secondary | ICD-10-CM | POA: Diagnosis not present

## 2022-09-21 DIAGNOSIS — Z510 Encounter for antineoplastic radiation therapy: Secondary | ICD-10-CM | POA: Diagnosis not present

## 2022-09-21 LAB — RAD ONC ARIA SESSION SUMMARY
Course Elapsed Days: 56
Plan Fractions Treated to Date: 39
Plan Prescribed Dose Per Fraction: 2 Gy
Plan Total Fractions Prescribed: 40
Plan Total Prescribed Dose: 80 Gy
Reference Point Dosage Given to Date: 78 Gy
Reference Point Session Dosage Given: 2 Gy
Session Number: 39

## 2022-09-22 ENCOUNTER — Other Ambulatory Visit: Payer: Self-pay

## 2022-09-22 ENCOUNTER — Other Ambulatory Visit: Payer: Self-pay | Admitting: Cardiology

## 2022-09-22 ENCOUNTER — Ambulatory Visit
Admission: RE | Admit: 2022-09-22 | Discharge: 2022-09-22 | Disposition: A | Discharge status: Home / Self Care | Payer: PPO | Source: Ambulatory Visit | Attending: Radiation Oncology | Admitting: Radiation Oncology

## 2022-09-22 DIAGNOSIS — Z87891 Personal history of nicotine dependence: Secondary | ICD-10-CM | POA: Diagnosis not present

## 2022-09-22 DIAGNOSIS — K219 Gastro-esophageal reflux disease without esophagitis: Secondary | ICD-10-CM | POA: Diagnosis not present

## 2022-09-22 DIAGNOSIS — G4733 Obstructive sleep apnea (adult) (pediatric): Secondary | ICD-10-CM | POA: Diagnosis not present

## 2022-09-22 DIAGNOSIS — C61 Malignant neoplasm of prostate: Secondary | ICD-10-CM | POA: Diagnosis not present

## 2022-09-22 DIAGNOSIS — J449 Chronic obstructive pulmonary disease, unspecified: Secondary | ICD-10-CM | POA: Diagnosis not present

## 2022-09-22 DIAGNOSIS — Z122 Encounter for screening for malignant neoplasm of respiratory organs: Secondary | ICD-10-CM | POA: Diagnosis not present

## 2022-09-22 DIAGNOSIS — Z510 Encounter for antineoplastic radiation therapy: Secondary | ICD-10-CM | POA: Diagnosis not present

## 2022-09-22 DIAGNOSIS — F17200 Nicotine dependence, unspecified, uncomplicated: Secondary | ICD-10-CM | POA: Diagnosis not present

## 2022-09-22 DIAGNOSIS — Z191 Hormone sensitive malignancy status: Secondary | ICD-10-CM | POA: Diagnosis not present

## 2022-09-22 LAB — RAD ONC ARIA SESSION SUMMARY
Course Elapsed Days: 57
Plan Fractions Treated to Date: 40
Plan Prescribed Dose Per Fraction: 2 Gy
Plan Total Fractions Prescribed: 40
Plan Total Prescribed Dose: 80 Gy
Reference Point Dosage Given to Date: 80 Gy
Reference Point Session Dosage Given: 2 Gy
Session Number: 40

## 2022-09-29 ENCOUNTER — Ambulatory Visit (INDEPENDENT_AMBULATORY_CARE_PROVIDER_SITE_OTHER): Payer: PPO

## 2022-09-29 DIAGNOSIS — R55 Syncope and collapse: Secondary | ICD-10-CM | POA: Diagnosis not present

## 2022-10-04 LAB — CUP PACEART REMOTE DEVICE CHECK
Date Time Interrogation Session: 20231013051502
Implantable Pulse Generator Implant Date: 20230531
Pulse Gen Serial Number: 183770

## 2022-10-06 NOTE — Progress Notes (Signed)
Carelink Summary Report / Loop Recorder 

## 2022-10-17 ENCOUNTER — Other Ambulatory Visit: Payer: Self-pay | Admitting: *Deleted

## 2022-10-17 ENCOUNTER — Encounter (INDEPENDENT_AMBULATORY_CARE_PROVIDER_SITE_OTHER): Payer: Self-pay

## 2022-10-20 DIAGNOSIS — B351 Tinea unguium: Secondary | ICD-10-CM | POA: Diagnosis not present

## 2022-10-20 DIAGNOSIS — E119 Type 2 diabetes mellitus without complications: Secondary | ICD-10-CM | POA: Diagnosis not present

## 2022-10-20 DIAGNOSIS — M79671 Pain in right foot: Secondary | ICD-10-CM | POA: Diagnosis not present

## 2022-10-20 DIAGNOSIS — M79672 Pain in left foot: Secondary | ICD-10-CM | POA: Diagnosis not present

## 2022-10-24 ENCOUNTER — Telehealth: Payer: Self-pay | Admitting: Cardiology

## 2022-10-24 DIAGNOSIS — I480 Paroxysmal atrial fibrillation: Secondary | ICD-10-CM

## 2022-10-24 DIAGNOSIS — I10 Essential (primary) hypertension: Secondary | ICD-10-CM

## 2022-10-24 NOTE — Telephone Encounter (Signed)
Patient has been made aware. Lab orders have been placed. He will come this week.     Furth, Cadence H, PA-C  You2 hours ago (1:05 PM)    We can re-check potassium and magnesium to make sure.

## 2022-10-24 NOTE — Telephone Encounter (Signed)
Pt c/o medication issue:  1. Name of Medication:   magnesium oxide (MAG-OX) 400 MG tablet    potassium chloride SA (KLOR-CON M) 20 MEQ tablet    2. How are you currently taking this medication (dosage and times per day)?   metoprolol tartrate (LOPRESSOR) 25 MG tablet Take 12.5 mg by mouth 2 (two) times daily.   potassium chloride SA (KLOR-CON M) 20 MEQ tablet TAKE ONE TABLET BY MOUTH EVERY DAY    3. Are you having a reaction (difficulty breathing--STAT)? no  4. What is your medication issue? PT wants to know if he should still be taking these medications

## 2022-10-24 NOTE — Telephone Encounter (Signed)
Returned the call to the patient. He was calling to see if he should be taking the potassium and magnesium. Per last labs in September, his magnesium was still low and he was advised to stay on it for now. He wants to know if he should also be on the potassium. He will need refills on both.

## 2022-10-25 ENCOUNTER — Other Ambulatory Visit
Admission: RE | Admit: 2022-10-25 | Discharge: 2022-10-25 | Disposition: A | Discharge status: Home / Self Care | Payer: PPO | Attending: Medical | Admitting: Medical

## 2022-10-25 ENCOUNTER — Telehealth: Payer: Self-pay

## 2022-10-25 DIAGNOSIS — I10 Essential (primary) hypertension: Secondary | ICD-10-CM | POA: Diagnosis not present

## 2022-10-25 DIAGNOSIS — I480 Paroxysmal atrial fibrillation: Secondary | ICD-10-CM | POA: Insufficient documentation

## 2022-10-25 LAB — BASIC METABOLIC PANEL
Anion gap: 8 (ref 5–15)
BUN: 13 mg/dL (ref 8–23)
CO2: 29 mmol/L (ref 22–32)
Calcium: 9.7 mg/dL (ref 8.9–10.3)
Chloride: 102 mmol/L (ref 98–111)
Creatinine, Ser: 0.96 mg/dL (ref 0.61–1.24)
GFR, Estimated: >60 mL/min (ref >60)
Glucose, Bld: 221 mg/dL — ABNORMAL HIGH (ref 70–99)
Potassium: 4 mmol/L (ref 3.5–5.1)
Sodium: 139 mmol/L (ref 135–145)

## 2022-10-25 LAB — MAGNESIUM: Magnesium: 1.7 mg/dL (ref 1.7–2.4)

## 2022-10-25 NOTE — Telephone Encounter (Signed)
Received the following alert from [employer]:  ILR alert for tachy episode, EGM shows 15 beat run of NSVT on 10/24/22 @ 1453. Indication: syncope. Routing for further review. - JJB  Reviewed transmission with Dr. Lovena Le. Patient did have confirmed 15 beat run of NSVT with some possible SVT.  Dr. Lovena Le noted today no changes but to flag this for Dr. Quentin Ore on his return.  Nothing to do for today.

## 2022-10-26 ENCOUNTER — Other Ambulatory Visit: Payer: Self-pay

## 2022-10-26 MED ORDER — MAGNESIUM OXIDE 400 MG PO TABS: 1.0000 | ORAL_TABLET | Freq: Every day | ORAL | 3 refills | Status: DC

## 2022-10-31 ENCOUNTER — Ambulatory Visit (INDEPENDENT_AMBULATORY_CARE_PROVIDER_SITE_OTHER): Payer: PPO

## 2022-10-31 ENCOUNTER — Other Ambulatory Visit: Payer: Self-pay | Admitting: *Deleted

## 2022-10-31 DIAGNOSIS — R55 Syncope and collapse: Secondary | ICD-10-CM

## 2022-10-31 DIAGNOSIS — Z87891 Personal history of nicotine dependence: Secondary | ICD-10-CM

## 2022-10-31 DIAGNOSIS — Z122 Encounter for screening for malignant neoplasm of respiratory organs: Secondary | ICD-10-CM

## 2022-11-01 LAB — CUP PACEART REMOTE DEVICE CHECK
Date Time Interrogation Session: 20231113001200
Implantable Pulse Generator Implant Date: 20230531
Pulse Gen Serial Number: 183770

## 2022-11-02 ENCOUNTER — Encounter: Payer: Self-pay | Admitting: Radiation Oncology

## 2022-11-02 ENCOUNTER — Ambulatory Visit
Admission: RE | Admit: 2022-11-02 | Discharge: 2022-11-02 | Disposition: A | Discharge status: Home / Self Care | Payer: PPO | Source: Ambulatory Visit | Attending: Radiation Oncology | Admitting: Radiation Oncology

## 2022-11-02 VITALS — BP 156/67 | HR 58 | Temp 98.0°F | Resp 14 | Ht 70.0 in | Wt 191.0 lb

## 2022-11-02 DIAGNOSIS — C61 Malignant neoplasm of prostate: Secondary | ICD-10-CM | POA: Diagnosis not present

## 2022-11-02 NOTE — Progress Notes (Signed)
Radiation Oncology Follow up Note  Name: Bryan Strong   Date:   11/02/2022 MRN:  [id number] DOB: 1946-02-07    This 76 y.o. male presents to the clinic today for 1 month follow-up 6 status post IMRT radiation therapy for stage IIc Gleason 7 (4+3) adenocarcinoma the prostate presenting with a PSA of 9.  REFERRING PROVIDER: Derinda Late, MD  HPI: Patient is a 76 year old male now out 1 month having completed IMRT radiation therapy to his prostate for stage IIc Gleason 7 adenocarcinoma.  Seen today in routine follow-up he is doing well most of his symptoms have receded.  He is having no significant lower urinary tract symptoms.  His urgency has improved he is currently on Flomax which she has been working well.  He is also having no significant diarrhea..  COMPLICATIONS OF TREATMENT: none  FOLLOW UP COMPLIANCE: keeps appointments   PHYSICAL EXAM:  BP (!) 156/67   Pulse (!) 58   Temp 98 F (36.7 C)   Resp 14   Ht '5\' 10"'$  (1.778 m)   Wt 191 lb (86.6 kg)   BMI 27.41 kg/m  Well-developed well-nourished patient in NAD. HEENT reveals PERLA, EOMI, discs not visualized.  Oral cavity is clear. No oral mucosal lesions are identified. Neck is clear without evidence of cervical or supraclavicular adenopathy. Lungs are clear to A&P. Cardiac examination is essentially unremarkable with regular rate and rhythm without murmur rub or thrill. Abdomen is benign with no organomegaly or masses noted. Motor sensory and DTR levels are equal and symmetric in the upper and lower extremities. Cranial nerves II through XII are grossly intact. Proprioception is intact. No peripheral adenopathy or edema is identified. No motor or sensory levels are noted. Crude visual fields are within normal range.  RADIOLOGY RESULTS: No current films to review  PLAN: At the present time patient is doing well with minimal side effects from his previous radiation therapy.  And pleased with his overall progress.  Of asked to see him  back in 3 months with a PSA prior to that treatment.  Patient knows to call with any concerns.  I would like to take this opportunity to thank you for allowing me to participate in the care of your patient.Noreene Filbert, MD

## 2022-11-17 DIAGNOSIS — I2089 Other forms of angina pectoris: Secondary | ICD-10-CM | POA: Diagnosis not present

## 2022-11-17 DIAGNOSIS — I10 Essential (primary) hypertension: Secondary | ICD-10-CM | POA: Diagnosis not present

## 2022-11-17 DIAGNOSIS — I480 Paroxysmal atrial fibrillation: Secondary | ICD-10-CM | POA: Diagnosis not present

## 2022-11-17 DIAGNOSIS — I714 Abdominal aortic aneurysm, without rupture, unspecified: Secondary | ICD-10-CM | POA: Diagnosis not present

## 2022-11-17 DIAGNOSIS — R55 Syncope and collapse: Secondary | ICD-10-CM | POA: Diagnosis not present

## 2022-11-17 DIAGNOSIS — I471 Supraventricular tachycardia, unspecified: Secondary | ICD-10-CM | POA: Diagnosis not present

## 2022-11-17 DIAGNOSIS — C9000 Multiple myeloma not having achieved remission: Secondary | ICD-10-CM | POA: Diagnosis not present

## 2022-11-18 DIAGNOSIS — Z79899 Other long term (current) drug therapy: Secondary | ICD-10-CM | POA: Diagnosis not present

## 2022-11-18 DIAGNOSIS — E1169 Type 2 diabetes mellitus with other specified complication: Secondary | ICD-10-CM | POA: Diagnosis not present

## 2022-11-18 DIAGNOSIS — E1159 Type 2 diabetes mellitus with other circulatory complications: Secondary | ICD-10-CM | POA: Diagnosis not present

## 2022-11-18 DIAGNOSIS — E785 Hyperlipidemia, unspecified: Secondary | ICD-10-CM | POA: Diagnosis not present

## 2022-11-18 DIAGNOSIS — I152 Hypertension secondary to endocrine disorders: Secondary | ICD-10-CM | POA: Diagnosis not present

## 2022-11-23 ENCOUNTER — Telehealth: Payer: Self-pay | Admitting: Cardiology

## 2022-11-23 ENCOUNTER — Other Ambulatory Visit
Admission: RE | Admit: 2022-11-23 | Discharge: 2022-11-23 | Disposition: A | Discharge status: Home / Self Care | Payer: PPO | Source: Ambulatory Visit | Attending: Medical | Admitting: Medical

## 2022-11-23 DIAGNOSIS — I480 Paroxysmal atrial fibrillation: Secondary | ICD-10-CM

## 2022-11-23 DIAGNOSIS — I2510 Atherosclerotic heart disease of native coronary artery without angina pectoris: Secondary | ICD-10-CM

## 2022-11-23 LAB — BASIC METABOLIC PANEL
Anion gap: 6 (ref 5–15)
BUN: 13 mg/dL (ref 8–23)
CO2: 30 mmol/L (ref 22–32)
Calcium: 9.4 mg/dL (ref 8.9–10.3)
Chloride: 101 mmol/L (ref 98–111)
Creatinine, Ser: 0.92 mg/dL (ref 0.61–1.24)
GFR, Estimated: >60 mL/min (ref >60)
Glucose, Bld: 279 mg/dL — ABNORMAL HIGH (ref 70–99)
Potassium: 4.3 mmol/L (ref 3.5–5.1)
Sodium: 137 mmol/L (ref 135–145)

## 2022-11-23 NOTE — Telephone Encounter (Signed)
Patient was returning call. Please advise ?

## 2022-11-24 ENCOUNTER — Other Ambulatory Visit
Admission: RE | Admit: 2022-11-24 | Discharge: 2022-11-24 | Disposition: A | Discharge status: Home / Self Care | Payer: PPO | Attending: Medical | Admitting: Medical

## 2022-11-24 DIAGNOSIS — I480 Paroxysmal atrial fibrillation: Secondary | ICD-10-CM | POA: Diagnosis not present

## 2022-11-24 DIAGNOSIS — I2510 Atherosclerotic heart disease of native coronary artery without angina pectoris: Secondary | ICD-10-CM | POA: Diagnosis not present

## 2022-11-24 LAB — MAGNESIUM: Magnesium: 1.6 mg/dL — ABNORMAL LOW (ref 1.7–2.4)

## 2022-11-24 NOTE — Telephone Encounter (Signed)
Patient was returning phone call. Please call back 

## 2022-11-24 NOTE — Telephone Encounter (Signed)
Patient made aware of results and verbalized understanding.  Magnesium lab ordered.     Cadence Ninfa Meeker, PA-C 11/23/2022  3:08 PM EST     Labs look stable. I think he was supposed to have magnesium drawn also, can we order this?

## 2022-11-25 DIAGNOSIS — E1159 Type 2 diabetes mellitus with other circulatory complications: Secondary | ICD-10-CM | POA: Diagnosis not present

## 2022-11-25 DIAGNOSIS — I714 Abdominal aortic aneurysm, without rupture, unspecified: Secondary | ICD-10-CM | POA: Diagnosis not present

## 2022-11-25 DIAGNOSIS — I480 Paroxysmal atrial fibrillation: Secondary | ICD-10-CM | POA: Diagnosis not present

## 2022-11-25 DIAGNOSIS — J449 Chronic obstructive pulmonary disease, unspecified: Secondary | ICD-10-CM | POA: Diagnosis not present

## 2022-11-25 DIAGNOSIS — I2510 Atherosclerotic heart disease of native coronary artery without angina pectoris: Secondary | ICD-10-CM | POA: Diagnosis not present

## 2022-11-25 DIAGNOSIS — I4729 Other ventricular tachycardia: Secondary | ICD-10-CM | POA: Diagnosis not present

## 2022-11-25 DIAGNOSIS — E1169 Type 2 diabetes mellitus with other specified complication: Secondary | ICD-10-CM | POA: Diagnosis not present

## 2022-11-25 DIAGNOSIS — Z23 Encounter for immunization: Secondary | ICD-10-CM | POA: Diagnosis not present

## 2022-11-25 DIAGNOSIS — C61 Malignant neoplasm of prostate: Secondary | ICD-10-CM | POA: Diagnosis not present

## 2022-11-25 DIAGNOSIS — I700 Atherosclerosis of aorta: Secondary | ICD-10-CM | POA: Diagnosis not present

## 2022-11-25 DIAGNOSIS — D696 Thrombocytopenia, unspecified: Secondary | ICD-10-CM | POA: Diagnosis not present

## 2022-11-25 DIAGNOSIS — I739 Peripheral vascular disease, unspecified: Secondary | ICD-10-CM | POA: Diagnosis not present

## 2022-11-30 ENCOUNTER — Telehealth: Payer: Self-pay | Admitting: Cardiology

## 2022-11-30 NOTE — Progress Notes (Deleted)
Cardiology Office Note:    Date:  11/30/2022   ID:  Bryan Strong, DOB Nov 17, 1946, MRN [id number]  PCP:  Derinda Late, MD  [facility] Cardiologist:  None  [facility] Electrophysiologist:  Vickie Epley, MD   Referring MD: Derinda Late, MD   Chief Complaint: 3 month follow-up  History of Present Illness:    Bryan Strong is a 76 y.o. male with a hx of paroxysmal Afib, CAD s/p PCI, COPD, HLD, PAD, OSA, DM2, prostate cancer, multiple myeloma who is being seen for hospital follow-up.    H/o of CAD with RCA stenting in 2012. Most recent [facility] 05/2017 showed 75% OM1 lesion, 90% ostial D1 and patent RCA stent. Medical management was recommended. Echo 12/2021 showed normal biV function, no valvular disease.    Seen by Dr. Quentin Ore for syncope in 04/2022. A Loop recorder was placed.    On 9/7 he was noted to have 18 beat run of NSVT. He was called and recommended ED evaluation. In the ED, vital signs notable for BP 154/77, pulse 73, SPO2 100% on room air.  Labs notable for sodium 133, potassium 3.9, magnesium 1.1, WBC 3.9, hemoglobin 13.3, platelets 120, TSH 1.5.  EKG showed normal sinus rhythm, rate 100, no ST abnormalities.  Chest x-ray unremarkable. Due to NSVT he underwent heart cath showing patent distal RCA stent, mild branch vessel disease in the OM and diagonal. No lesions to explain NSVT. He was seen by EP who felt presyncope was likely hypotension. Less likely VT given loop tracing. Also abnormal electrolytes were contributing.   Last seen 08/29/22 and reported pre-sycnope. He was taking mag and K.   He saw [facility] 11/21/22  Today,   Past Medical History:  Diagnosis Date   A-fib ([place])    Achalasia of esophagus    Allergic rhinitis    Anemia    Anginal pain ([place])    Aortic aneurysm ([place])    followed by Dr. Franchot Gallo   Coronary artery disease    Diabetes mellitus without complication ([place])    Dyspnea    Emphysema of lung ([place])    GERD (gastroesophageal reflux  disease)    Heart murmur    Hypercholesteremia    Hypertension    Kidney stones    Skin cancer    Sleep apnea    CPAP   Wears dentures    full upper and lower    Past Surgical History:  Procedure Laterality Date   CHOLECYSTECTOMY     colon polyps     COLONOSCOPY WITH PROPOFOL N/A 09/18/2017   Procedure: COLONOSCOPY WITH PROPOFOL;  Surgeon: Manya Silvas, MD;  Location: [facility] ENDOSCOPY;  Service: Endoscopy;  Laterality: N/A;   CORONARY ANGIOPLASTY WITH STENT PLACEMENT  01/03/11   [facility]  Dr. Saralyn Pilar   ECTROPION REPAIR Bilateral 05/03/2016   Procedure: REPAIR OF ECTROPION BILATERAL LOWER EYELIDS;  Surgeon: Karle Starch, MD;  Location: [place];  Service: Ophthalmology;  Laterality: Bilateral;   ESOPHAGOGASTRODUODENOSCOPY (EGD) WITH PROPOFOL N/A 07/21/2017   Procedure: ESOPHAGOGASTRODUODENOSCOPY (EGD) WITH PROPOFOL;  Surgeon: Manya Silvas, MD;  Location: [facility] ENDOSCOPY;  Service: Endoscopy;  Laterality: N/A;   ESOPHAGOGASTRODUODENOSCOPY (EGD) WITH PROPOFOL Bilateral 10/18/2017   Procedure: ESOPHAGOGASTRODUODENOSCOPY (EGD) WITH PROPOFOL;  Surgeon: Manya Silvas, MD;  Location: [facility] ENDOSCOPY;  Service: Endoscopy;  Laterality: Bilateral;   HERNIA REPAIR     LEFT HEART CATH Left 06/08/2017   Procedure: Left Heart Cath and Coronary Angiography;  Surgeon: Yolonda Kida, MD;  Location: [place] CV LAB;  Service: Cardiovascular;  Laterality: Left;   LEFT HEART CATH AND CORONARY ANGIOGRAPHY N/A 08/26/2022   Procedure: LEFT HEART CATH AND CORONARY ANGIOGRAPHY;  Surgeon: Jettie Booze, MD;  Location: [place] CV LAB;  Service: Cardiovascular;  Laterality: N/A;   LID LESION EXCISION Bilateral 05/03/2016   Procedure: ,EXCISION CYST RIGHT UPPER EYELID;  Surgeon: Karle Starch, MD;  Location: [place];  Service: Ophthalmology;  Laterality: Bilateral;  Diabetic - oral meds CPAP   NASAL SEPTUM SURGERY  1967   SAVORY DILATION N/A 07/21/2017   Procedure:  SAVORY DILATION;  Surgeon: Manya Silvas, MD;  Location: [facility] ENDOSCOPY;  Service: Endoscopy;  Laterality: N/A;    Current Medications: No outpatient medications have been marked as taking for the 12/02/22 encounter (Appointment) with Kathlen Mody, Prabhleen Montemayor H, PA-C.     Allergies:   Erythromycin base and Other   Social History   Socioeconomic History   Marital status: Married    Spouse name: Not on file   Number of children: Not on file   Years of education: Not on file   Highest education level: Not on file  Occupational History   Not on file  Tobacco Use   Smoking status: Former    Packs/day: 1.00    Years: 56.00    Total pack years: 56.00    Types: Cigarettes    Quit date: 11/04/2015    Years since quitting: 7.0   Smokeless tobacco: Never  Vaping Use   Vaping Use: Never used  Substance and Sexual Activity   Alcohol use: No   Drug use: No   Sexual activity: Not on file  Other Topics Concern   Not on file  Social History Narrative   Not on file   Social Determinants of Health   Financial Resource Strain: Not on file  Food Insecurity: Not on file  Transportation Needs: Not on file  Physical Activity: Not on file  Stress: Not on file  Social Connections: Not on file     Family History: The patient's ***family history includes Diabetes in his brother, maternal aunt, maternal grandmother, and maternal uncle.  ROS:   Please see the history of present illness.    *** All other systems reviewed and are negative.  EKGs/Labs/Other Studies Reviewed:    The following studies were reviewed today: ***  EKG:  EKG is *** ordered today.  The ekg ordered today demonstrates ***  Recent Labs: 08/25/2022: TSH 1.525 09/20/2022: Hemoglobin 12.9; Platelets 109 11/23/2022: BUN 13; Creatinine, Ser 0.92; Potassium 4.3; Sodium 137 11/24/2022: Magnesium 1.6  Recent Lipid Panel No results found for: "CHOL", "TRIG", "HDL", "CHOLHDL", "VLDL", "LDLCALC", "LDLDIRECT"   Risk  Assessment/Calculations:   {Does this patient have ATRIAL FIBRILLATION?[phone]}   Physical Exam:    VS:  There were no vitals taken for this visit.    Wt Readings from Last 3 Encounters:  11/02/22 191 lb (86.6 kg)  08/29/22 192 lb 12.8 oz (87.5 kg)  08/27/22 180 lb 4.8 oz (81.8 kg)     GEN: *** Well nourished, well developed in no acute distress HEENT: Normal NECK: No JVD; No carotid bruits LYMPHATICS: No lymphadenopathy CARDIAC: ***RRR, no murmurs, rubs, gallops RESPIRATORY:  Clear to auscultation without rales, wheezing or rhonchi  ABDOMEN: Soft, non-tender, non-distended MUSCULOSKELETAL:  No edema; No deformity  SKIN: Warm and dry NEUROLOGIC:  Alert and oriented x 3 PSYCHIATRIC:  Normal affect   ASSESSMENT:    No diagnosis found. PLAN:  In order of problems listed above:  ***  Disposition: Follow up {follow up:15908} with ***   Shared Decision Making/Informed Consent   {Are you ordering a CV Procedure (e.g. stress test, cath, DCCV, TEE, etc)?   Press F2        :[id number]}    Signed, Michal Strzelecki Ninfa Meeker, PA-C  11/30/2022 12:53 PM    [facility]

## 2022-11-30 NOTE — Telephone Encounter (Signed)
LMOV to clarify Patient saw Dr Clayborn Bigness recently so appointment with [employer] would not be necessary unless patient requesting to still be seen

## 2022-12-01 ENCOUNTER — Ambulatory Visit (INDEPENDENT_AMBULATORY_CARE_PROVIDER_SITE_OTHER): Payer: PPO

## 2022-12-01 ENCOUNTER — Telehealth: Payer: Self-pay

## 2022-12-01 DIAGNOSIS — J101 Influenza due to other identified influenza virus with other respiratory manifestations: Secondary | ICD-10-CM | POA: Diagnosis not present

## 2022-12-01 DIAGNOSIS — R55 Syncope and collapse: Secondary | ICD-10-CM

## 2022-12-01 DIAGNOSIS — Z03818 Encounter for observation for suspected exposure to other biological agents ruled out: Secondary | ICD-10-CM | POA: Diagnosis not present

## 2022-12-01 LAB — CUP PACEART REMOTE DEVICE CHECK
Date Time Interrogation Session: 20231214001700
Implantable Pulse Generator Implant Date: 20230531
Pulse Gen Serial Number: 183770

## 2022-12-01 NOTE — Telephone Encounter (Signed)
Following alert received from [employer] received for was one tachy arrhythmia detected that appears to be SVT/VT at 170 bpm, duration 33 seconds. Hx of similar although duration of SVT appears longer.   Patient reports he was standing in the bathroom shaving and suddenly felt lightheaded for a few seconds then quickly subsided. Denies any further symptoms or complaints since. Compliant with meds on file. Advised I will forward to Dr. Quentin Ore for review.

## 2022-12-02 ENCOUNTER — Ambulatory Visit: Payer: PPO | Admitting: Medical

## 2022-12-02 ENCOUNTER — Telehealth: Payer: Self-pay | Admitting: Primary Care

## 2022-12-02 ENCOUNTER — Encounter: Payer: Self-pay | Admitting: Primary Care

## 2022-12-02 ENCOUNTER — Ambulatory Visit (INDEPENDENT_AMBULATORY_CARE_PROVIDER_SITE_OTHER): Payer: PPO | Admitting: Primary Care

## 2022-12-02 DIAGNOSIS — Z87891 Personal history of nicotine dependence: Secondary | ICD-10-CM

## 2022-12-02 NOTE — Telephone Encounter (Signed)
We will get him rescheduled in a few weeks.

## 2022-12-02 NOTE — Patient Instructions (Signed)
Thank you for participating in the [facility] Scre[facility]. It was our pleasure to meet you today. We will call you with the results of your scan within the next few days. Your scan will be assigned a Lung RADS category score by the physicians reading the scans.  This Lung RADS score determines follow up scanning.  See below for description of categories, and follow up screening recommendations. We will be in touch to schedule your follow up screening annually or based on recommendations of our providers. We will fax a copy of your scan results to your Primary Care Physician, or the physician who referred you to the program, to ensure they have the results. Please call the office if you have any questions or concerns regarding your scanning experience or results.  Our office number is [phone]. Please speak with Denise Phelps, RN. , or  Denise Buckner RN, They are  our Lung Cancer Screening RN.'s If They are unavailable when you call, Please leave a message on the voice mail. We will return your call at our earliest convenience.This voice mail is monitored several times a day.  Remember, if your scan is normal, we will scan you annually as long as you continue to meet the criteria for the program. (Age 55-77, Current smoker or smoker who has quit within the last 15 years). If you are a smoker, remember, quitting is the single most powerful action that you can take to decrease your risk of lung cancer and other pulmonary, breathing related problems. We know quitting is hard, and we are here to help.  Please let us know if there is anything we can do to help you meet your goal of quitting. If you are a former smoker, congratulations. We are proud of you! Remain smoke free! Remember you can refer friends or family members through the number above.  We will screen them to make sure they meet criteria for the program. Thank you for helping us take better care of you by  participating in Lung Screening.  You can receive free nicotine replacement therapy ( patches, gum or mints) by calling 1-800-QUIT NOW. Please call so we can get you on the path to becoming  a non-smoker. I know it is hard, but you can do this!  Lung RADS Categories:  Lung RADS 1: no nodules or definitely non-concerning nodules.  Recommendation is for a repeat annual scan in 12 months.  Lung RADS 2:  nodules that are non-concerning in appearance and behavior with a very low likelihood of becoming an active cancer. Recommendation is for a repeat annual scan in 12 months.  Lung RADS 3: nodules that are probably non-concerning , includes nodules with a low likelihood of becoming an active cancer.  Recommendation is for a 6-month repeat screening scan. Often noted after an upper respiratory illness. We will be in touch to make sure you have no questions, and to schedule your 6-month scan.  Lung RADS 4 A: nodules with concerning findings, recommendation is most often for a follow up scan in 3 months or additional testing based on our provider's assessment of the scan. We will be in touch to make sure you have no questions and to schedule the recommended 3 month follow up scan.  Lung RADS 4 B:  indicates findings that are concerning. We will be in touch with you to schedule additional diagnostic testing based on our provider's  assessment of the scan.  Other options for assistance in smoking cessation (   As covered by your insurance benefits)  Hypnosis for smoking cessation  [employer] [phone]  Acupuncture for smoking cessation  [facility] [phone]   

## 2022-12-02 NOTE — Progress Notes (Signed)
Shared Decision Making Visit Lung Cancer Screening Program [phone])   Eligibility: Age 76 y.o. Pack Years Smoking History Calculation 75 pack year hx (# packs/per year x # years smoked) Recent History of coughing up blood  no Unexplained weight loss? no ( >Than 15 pounds within the last 6 months ) Prior History Lung / other cancer no (Diagnosis within the last 5 years already requiring surveillance chest CT Scans). Smoking Status Former Smoker Former Smokers: Years since quit: 8 years  Quit Date: 8 years ago  Visit Components: Discussion included one or more decision making aids. yes Discussion included risk/benefits of screening. yes Discussion included potential follow up diagnostic testing for abnormal scans. yes Discussion included meaning and risk of over diagnosis. yes Discussion included meaning and risk of False Positives. yes Discussion included meaning of total radiation exposure. yes  Counseling Included: Importance of adherence to annual lung cancer LDCT screening. yes Impact of comorbidities on ability to participate in the program. yes Ability and willingness to under diagnostic treatment. yes  Smoking Cessation Counseling: Current Smokers:  Discussed importance of smoking cessation. yes Information about tobacco cessation classes and interventions provided to patient. yes Patient provided with "ticket" for LDCT Scan. Na Symptomatic Patient. no  Counseling(Intermediate counseling: > three minutes) 99406 Diagnosis Code: Tobacco Use Z72.0 Asymptomatic Patient yes  Counseling (Intermediate counseling: > three minutes counseling) V7858 Former Smokers:  Discussed the importance of maintaining cigarette abstinence. yes Diagnosis Code: Personal History of Nicotine Dependence. I50.277 Information about tobacco cessation classes and interventions provided to patient. Yes Patient provided with "ticket" for LDCT Scan. Na Written Order for Lung Cancer Screening with LDCT  placed in Epic. Yes (CT Chest Lung Cancer Screening Low Dose W/O CM) [id number] Z12.2-Screening of respiratory organs Z87.891-Personal history of nicotine dependence  I have spent 25 minutes of face to face/ virtual visit   time with Bryan Strong discussing the risks and benefits of lung cancer screening. We viewed / discussed a power point together that explained in detail the above noted topics. We paused at intervals to allow for questions to be asked and answered to ensure understanding.We discussed that the single most powerful action that he can take to decrease his risk of developing lung cancer is to quit smoking. We discussed whether or not he is ready to commit to setting a quit date. We discussed options for tools to aid in quitting smoking including nicotine replacement therapy, non-nicotine medications, support groups, Quit Smart classes, and behavior modification. We discussed that often times setting smaller, more achievable goals, such as eliminating 1 cigarette a day for a week and then 2 cigarettes a day for a week can be helpful in slowly decreasing the number of cigarettes smoked. This allows for a sense of accomplishment as well as providing a clinical benefit. I provided  him  with smoking cessation  information  with contact information for community resources, classes, free nicotine replacement therapy, and access to mobile apps, text messaging, and on-line smoking cessation help. I have also provided  him  the office contact information in the event he needs to contact me, or the screening staff. We discussed the time and location of the scan, and that either Bryan Glassman RN, Bryan Prince, RN  or I will call / send a letter with the results within 24-72 hours of receiving them. The patient verbalized understanding of all of  the above and had no further questions upon leaving the office. They have my contact information in the event  they have any further questions.  I spent 3-5 minutes  counseling on smoking cessation and the health risks of continued tobacco abuse.  I explained to the patient that there has been a high incidence of coronary artery disease noted on these exams. I explained that this is a non-gated exam therefore degree or severity cannot be determined. This patient is on statin therapy. I have asked the patient to follow-up with their PCP regarding any incidental finding of coronary artery disease and management with diet or medication as their PCP  feels is clinically indicated. The patient verbalized understanding of the above and had no further questions upon completion of the visit.  Positive for influenza on tamiflu. Needing to reschedule LDCT   Bryan Ehrich, NP

## 2022-12-02 NOTE — Telephone Encounter (Signed)
Patient has the flu, can we reschedule LDCT which is currently scheduled for Monday

## 2022-12-05 ENCOUNTER — Ambulatory Visit: Admission: RE | Admit: 2022-12-05 | Payer: PPO | Source: Ambulatory Visit

## 2022-12-05 NOTE — Telephone Encounter (Signed)
Written order obtained from Dr. Quentin Ore for patient to increase metoprolol tartrate 25 mg BID. Patient called and made aware and script sent to preferred pharmacy. Patient advised to call if he has any questions or concerns. Voiced understanding.

## 2022-12-07 ENCOUNTER — Telehealth: Payer: Self-pay | Admitting: Acute Care

## 2022-12-07 NOTE — Telephone Encounter (Signed)
Returned cal from VM to reschedule LDCT after 'flu'.  Appeared someone answered then hung up.  No one would speak and then line disconnected.

## 2022-12-07 NOTE — Telephone Encounter (Signed)
Called again and reached pt.  LDCT reschedule 12/14/22

## 2022-12-14 ENCOUNTER — Ambulatory Visit
Admission: RE | Admit: 2022-12-14 | Discharge: 2022-12-14 | Disposition: A | Discharge status: Home / Self Care | Payer: PPO | Source: Ambulatory Visit | Attending: Acute Care | Admitting: Acute Care

## 2022-12-14 DIAGNOSIS — Z8546 Personal history of malignant neoplasm of prostate: Secondary | ICD-10-CM | POA: Diagnosis not present

## 2022-12-14 DIAGNOSIS — Z87891 Personal history of nicotine dependence: Secondary | ICD-10-CM | POA: Insufficient documentation

## 2022-12-14 DIAGNOSIS — Z122 Encounter for screening for malignant neoplasm of respiratory organs: Secondary | ICD-10-CM | POA: Diagnosis not present

## 2022-12-14 DIAGNOSIS — R911 Solitary pulmonary nodule: Secondary | ICD-10-CM | POA: Diagnosis not present

## 2022-12-14 DIAGNOSIS — I700 Atherosclerosis of aorta: Secondary | ICD-10-CM | POA: Insufficient documentation

## 2022-12-14 DIAGNOSIS — J439 Emphysema, unspecified: Secondary | ICD-10-CM | POA: Diagnosis not present

## 2022-12-14 DIAGNOSIS — Z85828 Personal history of other malignant neoplasm of skin: Secondary | ICD-10-CM | POA: Insufficient documentation

## 2022-12-14 NOTE — Progress Notes (Signed)
Carelink Summary Report / Loop Recorder 

## 2022-12-20 ENCOUNTER — Other Ambulatory Visit: Payer: Self-pay

## 2022-12-20 ENCOUNTER — Telehealth: Payer: Self-pay | Admitting: Acute Care

## 2022-12-20 NOTE — Telephone Encounter (Signed)
Due to current radiation treatment for prostate cancer and patient turning age 77, insurance will no longer approve LDCT.  Patient has been sent a letter for update that [facility] can no longer follow his annual LDCT scans. Notification faxed to Dr. Raul Del as referring provider.

## 2022-12-23 NOTE — Progress Notes (Signed)
Carelink Summary Report / Loop Recorder 

## 2023-01-02 ENCOUNTER — Ambulatory Visit (INDEPENDENT_AMBULATORY_CARE_PROVIDER_SITE_OTHER): Payer: PPO

## 2023-01-02 DIAGNOSIS — R55 Syncope and collapse: Secondary | ICD-10-CM

## 2023-01-04 LAB — CUP PACEART REMOTE DEVICE CHECK
Date Time Interrogation Session: 20240115002100
Implantable Pulse Generator Implant Date: 20230531
Pulse Gen Serial Number: 183770

## 2023-01-10 ENCOUNTER — Telehealth: Payer: Self-pay | Admitting: Cardiology

## 2023-01-10 NOTE — Telephone Encounter (Signed)
Patient will need to contact PCP or his Mail Cardiologist Dr. Clayborn Bigness for refills. Dr. Quentin Ore did not prescribe.

## 2023-01-10 NOTE — Telephone Encounter (Signed)
*  STAT* If patient is at the pharmacy, call can be transferred to refill team.   1. Which medications need to be refilled? (please list name of each medication and dose if known) metoprolol tartrate (LOPRESSOR) 25 MG tablet   2. Which pharmacy/location (including street and city if local pharmacy) is medication to be sent to? [place], [place]   3. Do they need a 30 day or 90 day supply? 90 day

## 2023-01-11 ENCOUNTER — Other Ambulatory Visit: Payer: Self-pay | Admitting: Cardiology

## 2023-01-11 ENCOUNTER — Other Ambulatory Visit: Payer: Self-pay

## 2023-01-11 MED ORDER — METOPROLOL TARTRATE 25 MG PO TABS: 25.0000 mg | ORAL_TABLET | Freq: Two times a day (BID) | ORAL | 2 refills | Status: DC

## 2023-01-12 MED ORDER — METOPROLOL TARTRATE 25 MG PO TABS: 25.0000 mg | ORAL_TABLET | Freq: Two times a day (BID) | ORAL | 2 refills | Status: DC

## 2023-01-12 NOTE — Addendum Note (Signed)
Addended by: Gaetano Net on: 01/12/2023 10:38 AM   Modules accepted: Orders

## 2023-01-16 DIAGNOSIS — Z7984 Long term (current) use of oral hypoglycemic drugs: Secondary | ICD-10-CM | POA: Diagnosis not present

## 2023-01-16 DIAGNOSIS — H2513 Age-related nuclear cataract, bilateral: Secondary | ICD-10-CM | POA: Diagnosis not present

## 2023-01-16 DIAGNOSIS — H52223 Regular astigmatism, bilateral: Secondary | ICD-10-CM | POA: Diagnosis not present

## 2023-01-16 DIAGNOSIS — H5213 Myopia, bilateral: Secondary | ICD-10-CM | POA: Diagnosis not present

## 2023-01-16 DIAGNOSIS — H524 Presbyopia: Secondary | ICD-10-CM | POA: Diagnosis not present

## 2023-01-16 DIAGNOSIS — E119 Type 2 diabetes mellitus without complications: Secondary | ICD-10-CM | POA: Diagnosis not present

## 2023-01-26 ENCOUNTER — Other Ambulatory Visit: Payer: Self-pay | Admitting: *Deleted

## 2023-01-26 DIAGNOSIS — C61 Malignant neoplasm of prostate: Secondary | ICD-10-CM

## 2023-01-31 DIAGNOSIS — E119 Type 2 diabetes mellitus without complications: Secondary | ICD-10-CM | POA: Diagnosis not present

## 2023-01-31 DIAGNOSIS — M79672 Pain in left foot: Secondary | ICD-10-CM | POA: Diagnosis not present

## 2023-01-31 DIAGNOSIS — M79671 Pain in right foot: Secondary | ICD-10-CM | POA: Diagnosis not present

## 2023-01-31 DIAGNOSIS — B353 Tinea pedis: Secondary | ICD-10-CM | POA: Diagnosis not present

## 2023-01-31 DIAGNOSIS — B351 Tinea unguium: Secondary | ICD-10-CM | POA: Diagnosis not present

## 2023-02-01 ENCOUNTER — Inpatient Hospital Stay: Payer: PPO | Attending: Radiation Oncology

## 2023-02-01 DIAGNOSIS — C61 Malignant neoplasm of prostate: Secondary | ICD-10-CM | POA: Diagnosis not present

## 2023-02-01 LAB — PSA: Prostatic Specific Antigen: 1.19 ng/mL (ref 0.00–4.00)

## 2023-02-02 ENCOUNTER — Ambulatory Visit (INDEPENDENT_AMBULATORY_CARE_PROVIDER_SITE_OTHER): Payer: PPO

## 2023-02-02 DIAGNOSIS — R55 Syncope and collapse: Secondary | ICD-10-CM

## 2023-02-06 LAB — CUP PACEART REMOTE DEVICE CHECK
Date Time Interrogation Session: 20240219002600
Implantable Pulse Generator Implant Date: 20230531
Pulse Gen Serial Number: 183770

## 2023-02-08 ENCOUNTER — Encounter: Payer: Self-pay | Admitting: Radiation Oncology

## 2023-02-08 ENCOUNTER — Ambulatory Visit
Admission: RE | Admit: 2023-02-08 | Discharge: 2023-02-08 | Disposition: A | Discharge status: Home / Self Care | Payer: PPO | Source: Ambulatory Visit | Attending: Radiation Oncology | Admitting: Radiation Oncology

## 2023-02-08 ENCOUNTER — Other Ambulatory Visit: Payer: Self-pay | Admitting: *Deleted

## 2023-02-08 VITALS — BP 153/80 | HR 54 | Temp 98.3°F | Resp 20 | Ht 69.5 in | Wt 193.6 lb

## 2023-02-08 DIAGNOSIS — R351 Nocturia: Secondary | ICD-10-CM | POA: Insufficient documentation

## 2023-02-08 DIAGNOSIS — C61 Malignant neoplasm of prostate: Secondary | ICD-10-CM | POA: Diagnosis not present

## 2023-02-08 DIAGNOSIS — Z191 Hormone sensitive malignancy status: Secondary | ICD-10-CM | POA: Diagnosis not present

## 2023-02-08 DIAGNOSIS — Z923 Personal history of irradiation: Secondary | ICD-10-CM | POA: Insufficient documentation

## 2023-02-08 NOTE — Progress Notes (Signed)
Radiation Oncology Follow up Note  Name: Bryan Strong   Date:   02/08/2023 MRN:  [id number] DOB: 1946-09-15    This 77 y.o. male presents to the clinic today for 92-monthfollow-up status post IMRT radiation therapy to for a Gleason 7 (4+3) adenocarcinoma the prostate presenting with a PSA of 9.  REFERRING PROVIDER: BDerinda Late MD  HPI: Patient is a 77year old male now out for months having completed IMRT radiation therapy to his prostate for Gleason 7 (4+3) adenocarcinoma the prostate presenting with a PSA of 9.  Seen today in routine follow-up he is doing well he is currently on Flomax he has nocturia x 1-2.  No real significant increase in lower urinary tract symptoms no diarrhea or bone pain.  His most recent PSA is 10000000  COMPLICATIONS OF TREATMENT: none  FOLLOW UP COMPLIANCE: keeps appointments   PHYSICAL EXAM:  BP (!) 153/80 (BP Location: Left Arm, Patient Position: Sitting, Cuff Size: Normal)   Pulse (!) 54   Temp 98.3 F (36.8 C) (Tympanic)   Resp 20   Ht 5' 9.5" (1.765 m) Comment: stated HT  Wt 193 lb 9.6 oz (87.8 kg)   BMI 28.18 kg/m  Well-developed well-nourished patient in NAD. HEENT reveals PERLA, EOMI, discs not visualized.  Oral cavity is clear. No oral mucosal lesions are identified. Neck is clear without evidence of cervical or supraclavicular adenopathy. Lungs are clear to A&P. Cardiac examination is essentially unremarkable with regular rate and rhythm without murmur rub or thrill. Abdomen is benign with no organomegaly or masses noted. Motor sensory and DTR levels are equal and symmetric in the upper and lower extremities. Cranial nerves II through XII are grossly intact. Proprioception is intact. No peripheral adenopathy or edema is identified. No motor or sensory levels are noted. Crude visual fields are within normal range.  RADIOLOGY RESULTS: No current films for review  PLAN: Present time patient is under excellent biochemical control of his prostate  cancer.  And pleased with his overall progress he has a low side effect profile.  Of asked to see him back in 6 months with a repeat PSA.  Patient is to call with any concerns.  I would like to take this opportunity to thank you for allowing me to participate in the care of your patient..Noreene Filbert MD

## 2023-02-13 ENCOUNTER — Telehealth: Payer: Self-pay

## 2023-02-13 NOTE — Telephone Encounter (Signed)
Pt called back.  He does remember this episode.  He was sitting down for breakfast when he had a few seconds of feeling weak and then it passed.  States he has been taking metoprolol as ordered and tolerating well.  Advised would forward to Dr. Quentin Ore for review and if any changes would return his call.

## 2023-02-13 NOTE — Telephone Encounter (Signed)
Alert received from [employer]:  ILR alert for VT Event occurred 2/24 @ 10:04, duration 12sec, 22 beats, mean HR 190 - route to triage  Outreach made to Pt to assess for symptoms.  He was not available.  Left a message for him to return call to device clinic.

## 2023-02-14 NOTE — Telephone Encounter (Signed)
Reply received from Dr. Quentin Ore.  No medication changes recommended at this time.  Pt aware would call back only if changes forthcoming.  No action needed.

## 2023-02-17 NOTE — Progress Notes (Signed)
Carelink Summary Report / Loop Recorder 

## 2023-03-01 NOTE — Progress Notes (Signed)
Carelink Summary Report / Loop Recorder 

## 2023-03-13 ENCOUNTER — Ambulatory Visit (INDEPENDENT_AMBULATORY_CARE_PROVIDER_SITE_OTHER): Payer: PPO

## 2023-03-13 DIAGNOSIS — R55 Syncope and collapse: Secondary | ICD-10-CM

## 2023-03-13 LAB — CUP PACEART REMOTE DEVICE CHECK
Date Time Interrogation Session: 20240325003200
Implantable Pulse Generator Implant Date: 20230531
Pulse Gen Serial Number: 183770

## 2023-04-13 ENCOUNTER — Ambulatory Visit (INDEPENDENT_AMBULATORY_CARE_PROVIDER_SITE_OTHER): Payer: PPO

## 2023-04-13 DIAGNOSIS — R55 Syncope and collapse: Secondary | ICD-10-CM

## 2023-04-13 LAB — CUP PACEART REMOTE DEVICE CHECK
Date Time Interrogation Session: 20240425003800
Implantable Pulse Generator Implant Date: 20230531
Pulse Gen Serial Number: 183770

## 2023-04-24 DIAGNOSIS — G4733 Obstructive sleep apnea (adult) (pediatric): Secondary | ICD-10-CM | POA: Diagnosis not present

## 2023-04-24 DIAGNOSIS — R400 Somnolence: Secondary | ICD-10-CM | POA: Diagnosis not present

## 2023-04-24 NOTE — Progress Notes (Signed)
[place] [facility] Recorder 

## 2023-05-09 NOTE — Progress Notes (Signed)
Carelink Summary Report / Loop Recorder 

## 2023-05-11 DIAGNOSIS — M79672 Pain in left foot: Secondary | ICD-10-CM | POA: Diagnosis not present

## 2023-05-11 DIAGNOSIS — M79671 Pain in right foot: Secondary | ICD-10-CM | POA: Diagnosis not present

## 2023-05-11 DIAGNOSIS — B351 Tinea unguium: Secondary | ICD-10-CM | POA: Diagnosis not present

## 2023-05-11 DIAGNOSIS — R55 Syncope and collapse: Secondary | ICD-10-CM | POA: Insufficient documentation

## 2023-05-11 DIAGNOSIS — I471 Supraventricular tachycardia, unspecified: Secondary | ICD-10-CM | POA: Insufficient documentation

## 2023-05-11 NOTE — Progress Notes (Signed)
Cardiology Office Note Date:  05/12/2023  Patient ID:  Bryan Strong, Bryan Strong 09/16/1946, MRN [id number] PCP:  Kandyce Rud, MD  Cardiologist:  None Electrophysiologist: Lanier Prude, MD    Chief Complaint: 1 year ILR, syncope follow-up  History of Present Illness: Bryan Strong is a 77 y.o. male with PMH notable for syncope, parox AFib, HTN, CAD; seen today for Lanier Prude, MD for routine electrophysiology followup.  He last saw Dr. Lalla Brothers 04/2022, was doing well. Was admitted 08/2022 after 18bt NSVT. Mag was low at 1.1. LHC showed stable CAD, no new lesions or progression of disease.  Determined that rhythm was actually aberrantly conducted SVT and not NSVT.   In 12/23, had additional episode of SVT by ILR associated with dizziness while standing at the sink, increased lopressor to 25mg  BID.   Today, he tells me that he continues to have brief episodes of palpitations, no episodes as severe as the 12/23 episode. The episodes are brief in duration, lasting a couple seconds. No CP, worsening SOB with the episodes.   He has had no further episodes of syncope.   He does have baseline level of SOB d/t COPD. Is able to walk around grocery store and bring in groceries without symptoms.  He did have an episode of chest discomfort last week, took a gas-ex and symptoms resolved.   Has appt later this month with PCP for routine follow-up, will get labs the week before.   Device Information: Bos Sci ILR, imp 04/2022; dx syncope  Past Medical History:  Diagnosis Date   A-fib (HCC)    Achalasia of esophagus    Allergic rhinitis    Anemia    Anginal pain (HCC)    Aortic aneurysm (HCC)    followed by Dr. Marena Chancy   Coronary artery disease    Diabetes mellitus without complication (HCC)    Dyspnea    Emphysema of lung (HCC)    GERD (gastroesophageal reflux disease)    Heart murmur    Hypercholesteremia    Hypertension    Kidney stones    Skin cancer    Sleep apnea    CPAP    Wears dentures    full upper and lower    Past Surgical History:  Procedure Laterality Date   CHOLECYSTECTOMY     colon polyps     COLONOSCOPY WITH PROPOFOL N/A 09/18/2017   Procedure: COLONOSCOPY WITH PROPOFOL;  Surgeon: Scot Jun, MD;  Location: [facility] ENDOSCOPY;  Service: Endoscopy;  Laterality: N/A;   CORONARY ANGIOPLASTY WITH STENT PLACEMENT  01/03/11   [facility]  Dr. Darrold Junker   ECTROPION REPAIR Bilateral 05/03/2016   Procedure: REPAIR OF ECTROPION BILATERAL LOWER EYELIDS;  Surgeon: Imagene Riches, MD;  Location: [facility] SURGERY [facility]TR;  Service: Ophthalmology;  Laterality: Bilateral;   ESOPHAGOGASTRODUODENOSCOPY (EGD) WITH PROPOFOL N/A 07/21/2017   Procedure: ESOPHAGOGASTRODUODENOSCOPY (EGD) WITH PROPOFOL;  Surgeon: Scot Jun, MD;  Location: [facility] ENDOSCOPY;  Service: Endoscopy;  Laterality: N/A;   ESOPHAGOGASTRODUODENOSCOPY (EGD) WITH PROPOFOL Bilateral 10/18/2017   Procedure: ESOPHAGOGASTRODUODENOSCOPY (EGD) WITH PROPOFOL;  Surgeon: Scot Jun, MD;  Location: [facility] ENDOSCOPY;  Service: Endoscopy;  Laterality: Bilateral;   HERNIA REPAIR     LEFT HEART CATH Left 06/08/2017   Procedure: Left Heart Cath and Coronary Angiography;  Surgeon: Alwyn Pea, MD;  Location: [facility] INVASIVE CV LAB;  Service: Cardiovascular;  Laterality: Left;   LEFT HEART CATH AND CORONARY ANGIOGRAPHY N/A 08/26/2022   Procedure: LEFT HEART CATH AND CORONARY  ANGIOGRAPHY;  Surgeon: Corky Crafts, MD;  Location: [facility] INVASIVE CV LAB;  Service: Cardiovascular;  Laterality: N/A;   LID LESION EXCISION Bilateral 05/03/2016   Procedure: ,EXCISION CYST RIGHT UPPER EYELID;  Surgeon: Imagene Riches, MD;  Location: [facility];  Service: Ophthalmology;  Laterality: Bilateral;  Diabetic - oral meds CPAP   NASAL SEPTUM SURGERY  1967   SAVORY DILATION N/A 07/21/2017   Procedure: SAVORY DILATION;  Surgeon: Scot Jun, MD;  Location: [facility] ENDOSCOPY;  Service: Endoscopy;  Laterality: N/A;    Current  Outpatient Medications  Medication Instructions   ascorbic acid (VITAMIN C) 500 mg, Oral, Daily   azelastine (ASTELIN) 0.1 % nasal spray 1 spray, Each Nare, 2 times daily, Use in each nostril as directed   calcium carbonate (OS-CAL) 1250 (500 Ca) MG chewable tablet 2 tablets, Oral, Daily PRN   cetirizine (ZYRTEC) 10 mg, Oral, Daily   Cholecalciferol 1,000 Units, Oral, Daily   clopidogrel (PLAVIX) 75 MG tablet 1 tablet, Oral, Daily   Flaxseed, Linseed, (FLAXSEED OIL PO) 1 tablet, Oral, 2 times daily   fluticasone (FLONASE) 50 MCG/ACT nasal spray 1 spray, Each Nare, 2 times daily   glipiZIDE (GLUCOTROL XL) 10 MG 24 hr tablet 1 tablet, Oral, Daily   lisinopril (ZESTRIL) 20 mg, Oral, 2 times daily   magnesium oxide (MAG-OX) 400 mg, Oral, Daily   metFORMIN (GLUCOPHAGE) 1,000 mg, Oral, 2 times daily with meals   metoprolol tartrate (LOPRESSOR) 25 mg, Oral, 2 times daily   Multiple Vitamin (MULTIVITAMIN WITH MINERALS) TABS tablet 1 tablet, Oral, Daily   pantoprazole (PROTONIX) 40 mg, Oral, Daily before breakfast   Polyethyl Glycol-Propyl Glycol (SYSTANE ULTRA OP) 2 drops, Ophthalmic, 2 times daily   simvastatin (ZOCOR) 40 MG tablet 1 tablet, Oral, Daily at bedtime   tamsulosin (FLOMAX) 0.4 mg, Oral, Daily after supper    Social History:  The patient  reports that he quit smoking about 7 years ago. His smoking use included cigarettes. He has a 56.00 pack-year smoking history. He has never used smokeless tobacco. He reports that he does not drink alcohol and does not use drugs.   Family History:  The patient's family history includes Diabetes in his brother, maternal aunt, maternal grandmother, and maternal uncle.  ROS:  Please see the history of present illness. All other systems are reviewed and otherwise negative.   PHYSICAL EXAM:  VS:  BP 118/60 (BP Location: Left Arm, Patient Position: Sitting, Cuff Size: Normal)   Pulse 63   Ht 5' 9.5" (1.765 m)   Wt 191 lb (86.6 kg)   SpO2 98%   BMI  27.80 kg/m  BMI: Body mass index is 27.8 kg/m.  GEN- The patient is well appearing, alert and oriented x 3 today.   Lungs- Clear to ausculation bilaterally, normal work of breathing.  Heart- Regular rate and rhythm, no murmurs, rubs or gallops Extremities- Trace peripheral edema, warm, dry Skin-   ILR implant site well-healed   Device interrogation done today and reviewed by myself:  Battery ok No recent episodes Has had brief SVT episodes, previously noted on remotes  EKG is ordered. Personal review of EKG from today shows:  NSR, rate 63bpm;   Recent Labs: 08/25/2022: TSH 1.525 09/20/2022: Hemoglobin 12.9; Platelets 109 11/23/2022: BUN 13; Creatinine, Ser 0.92; Potassium 4.3; Sodium 137 11/24/2022: Magnesium 1.6  No results found for requested labs within last 365 days.   CrCl cannot be calculated (Patient's most recent lab result is older than the  maximum 21 days allowed.).   Wt Readings from Last 3 Encounters:  05/12/23 191 lb (86.6 kg)  02/08/23 193 lb 9.6 oz (87.8 kg)  12/14/22 195 lb (88.5 kg)     Additional studies reviewed include: Previous EP, cardiology notes.   LHC, 08/26/2022   Ost RCA lesion is 10% stenosed.  No pressure dampening with catheter engagement.   1st Mrg lesion is 50% stenosed.   Mid RCA lesion is 25% stenosed.   1st Diag lesion is 25% stenosed.   Previously placed Dist RCA stent of unknown type is  widely patent.   The left ventricular systolic function is normal.   LV end diastolic pressure is normal.   The left ventricular ejection fraction is 55-65% by visual estimate.   There is no aortic valve stenosis.  Patent distal RCA stent.  Mild, branch vessel disease in the OM and diagonal.  Both lesions appeared improved compared to 2018 catheterization.  Minimal luminal irregularities in the LAD.  No lesions to explain nonsustained ventricular tachycardia.  TTE, 08/26/2022  1. Left ventricular ejection fraction, by estimation, is 60 to 65%. The left  ventricle has normal function. The left ventricle has no regional wall motion abnormalities. There is moderate concentric left ventricular hypertrophy. Left ventricular diastolic parameters are consistent with Grade I diastolic dysfunction (impaired relaxation).   2. Right ventricular systolic function is normal. The right ventricular size is normal.   3. A small pericardial effusion is present.   4. The mitral valve is normal in structure. Trivial mitral valve regurgitation. No evidence of mitral stenosis.   5. The aortic valve is tricuspid. Aortic valve regurgitation is not visualized. Aortic valve sclerosis is present, with no evidence of aortic valve stenosis.   6. Aortic dilatation noted. There is borderline dilatation of the aortic root, measuring 39 mm.   7. The inferior vena cava is normal in size with greater than 50% respiratory variability, suggesting right atrial pressure of 3 mmHg.   ASSESSMENT AND PLAN:  #) Syncope #) Bos Sci ILR in situ #) SVT #) concern for NSVT  Device functioning well, monthly remotes reviewed No recent arrhythmias No recent syncope events SVT episodes by device are very brief Patient's symptoms are stable during SVT episodes Continue lopressor 25mg  BID  Patient obtaining updated labs with PCP later this month - will review when obtained    Current medicines are reviewed at length with the patient today.   The patient does not have concerns regarding his medicines.  The following changes were made today:  none  Labs/ tests ordered today include:  Orders Placed This Encounter  Procedures   EKG 12-Lead     Disposition: Follow up with Dr. Lalla Brothers or EP APP in 12 months   Signed, Sherie Satish, NP  05/12/23  10:52 AM  Electrophysiology [facility]

## 2023-05-12 ENCOUNTER — Encounter: Payer: Self-pay | Admitting: Cardiology

## 2023-05-12 ENCOUNTER — Ambulatory Visit: Payer: PPO | Attending: Cardiology | Admitting: Cardiology

## 2023-05-12 VITALS — BP 118/60 | HR 63 | Ht 69.5 in | Wt 191.0 lb

## 2023-05-12 DIAGNOSIS — I471 Supraventricular tachycardia, unspecified: Secondary | ICD-10-CM | POA: Diagnosis not present

## 2023-05-12 DIAGNOSIS — R55 Syncope and collapse: Secondary | ICD-10-CM

## 2023-05-12 DIAGNOSIS — Z95818 Presence of other cardiac implants and grafts: Secondary | ICD-10-CM

## 2023-05-12 LAB — CUP PACEART INCLINIC DEVICE CHECK
Date Time Interrogation Session: 20240524113419
Implantable Pulse Generator Implant Date: 20230531
Pulse Gen Serial Number: 183770

## 2023-05-12 NOTE — Patient Instructions (Signed)
Medication Instructions:  Your physician recommends that you continue on your current medications as directed. Please refer to the Current Medication list given to you today.  *If you need a refill on your cardiac medications before your next appointment, please call your pharmacy*  Lab Work: No labs ordered  If you have labs (blood work) drawn today and your tests are completely normal, you will receive your results only by: MyChart Message (if you have MyChart) OR A paper copy in the mail If you have any lab test that is abnormal or we need to change your treatment, we will call you to review the results.  Testing/Procedures: No testing ordered  Follow-Up: At [facility], you and your health needs are our priority.  As part of our continuing mission to provide you with exceptional heart care, we have created designated Provider Care Teams.  These Care Teams include your primary Cardiologist (physician) and Advanced Practice Providers (APPs -  Physician Assistants and Nurse Practitioners) who all work together to provide you with the care you need, when you need it.  We recommend signing up for the patient portal called "MyChart".  Sign up information is provided on this After Visit Summary.  MyChart is used to connect with patients for Virtual Visits (Telemedicine).  Patients are able to view lab/test results, encounter notes, upcoming appointments, etc.  Non-urgent messages can be sent to your provider as well.   To learn more about what you can do with MyChart, go to [email or web address].    Your next appointment:   1 year(s)  Provider:   Cameron Lambert, MD or Suzann Riddle, NP 

## 2023-05-16 ENCOUNTER — Ambulatory Visit (INDEPENDENT_AMBULATORY_CARE_PROVIDER_SITE_OTHER): Payer: PPO

## 2023-05-16 DIAGNOSIS — R55 Syncope and collapse: Secondary | ICD-10-CM | POA: Diagnosis not present

## 2023-05-16 LAB — CUP PACEART REMOTE DEVICE CHECK
Date Time Interrogation Session: 20240528030000
Implantable Pulse Generator Implant Date: 20230531
Pulse Gen Serial Number: 183770

## 2023-05-18 DIAGNOSIS — I4729 Other ventricular tachycardia: Secondary | ICD-10-CM | POA: Diagnosis not present

## 2023-05-18 DIAGNOSIS — K219 Gastro-esophageal reflux disease without esophagitis: Secondary | ICD-10-CM | POA: Diagnosis not present

## 2023-05-18 DIAGNOSIS — I2510 Atherosclerotic heart disease of native coronary artery without angina pectoris: Secondary | ICD-10-CM | POA: Diagnosis not present

## 2023-05-18 DIAGNOSIS — G4733 Obstructive sleep apnea (adult) (pediatric): Secondary | ICD-10-CM | POA: Diagnosis not present

## 2023-05-18 DIAGNOSIS — E7800 Pure hypercholesterolemia, unspecified: Secondary | ICD-10-CM | POA: Diagnosis not present

## 2023-05-18 DIAGNOSIS — J439 Emphysema, unspecified: Secondary | ICD-10-CM | POA: Diagnosis not present

## 2023-05-18 DIAGNOSIS — I739 Peripheral vascular disease, unspecified: Secondary | ICD-10-CM | POA: Diagnosis not present

## 2023-05-18 DIAGNOSIS — E119 Type 2 diabetes mellitus without complications: Secondary | ICD-10-CM | POA: Diagnosis not present

## 2023-05-18 DIAGNOSIS — I10 Essential (primary) hypertension: Secondary | ICD-10-CM | POA: Diagnosis not present

## 2023-05-19 DIAGNOSIS — I152 Hypertension secondary to endocrine disorders: Secondary | ICD-10-CM | POA: Diagnosis not present

## 2023-05-19 DIAGNOSIS — E1169 Type 2 diabetes mellitus with other specified complication: Secondary | ICD-10-CM | POA: Diagnosis not present

## 2023-05-19 DIAGNOSIS — E1159 Type 2 diabetes mellitus with other circulatory complications: Secondary | ICD-10-CM | POA: Diagnosis not present

## 2023-05-19 DIAGNOSIS — Z125 Encounter for screening for malignant neoplasm of prostate: Secondary | ICD-10-CM | POA: Diagnosis not present

## 2023-05-19 DIAGNOSIS — E785 Hyperlipidemia, unspecified: Secondary | ICD-10-CM | POA: Diagnosis not present

## 2023-05-19 DIAGNOSIS — Z79899 Other long term (current) drug therapy: Secondary | ICD-10-CM | POA: Diagnosis not present

## 2023-05-19 DIAGNOSIS — R790 Abnormal level of blood mineral: Secondary | ICD-10-CM | POA: Diagnosis not present

## 2023-05-22 ENCOUNTER — Telehealth: Payer: Self-pay | Admitting: *Deleted

## 2023-05-22 ENCOUNTER — Telehealth: Payer: Self-pay | Admitting: Cardiology

## 2023-05-22 NOTE — Telephone Encounter (Signed)
RE: labs Received: Today Bryan Strong, CMA  Bryan Coty, NP Spoke with pt today he was made aware of his lab results. Pt was very understanding and agreed to increase his Magnesium 400 mg twice daily. No further questions at this time.        RE: labs Received: Today Bryan Zackry, NP  Bryan Strong, CMA His Mag level is still borderline low. I'd like him to increase his magnesium supplement to 400mg  twice a day (currently taking once).   Thanks!       Previous Messages    ----- Message ----- From: Bryan Strong, CMA Sent: 05/22/2023   9:11 AM EDT To: Bryan Tyshaun, NP Subject: FW: labs                                      Just FYI No lab work available at this time. ----- Message ----- From: Thayer Headings, Ruthell Rummage Sent: 05/19/2023  10:00 AM EDT To: Bryan Strong, CMA Subject: labs                                          Will you please let [name] know if you see this patient's labs result from [facility] (Dr. Pilar Plate office)? They are being drawn today. She is looking for his mag level and CMP. Lynelle Doctor!!!!! :)

## 2023-05-22 NOTE — Telephone Encounter (Signed)
-----   Message from Marina C Lopez, CMA sent at 05/22/2023 11:25 AM EDT ----- Regarding: RE: labs Thank you for checking again looks like they are appearing now. I reached out to pt who wasn't available at this time I LM with pts wife. She will let him know to contact our office to discuss results. ----- Message ----- From: Chelesa Weingartner, NP Sent: 05/22/2023  11:06 AM EDT To: Marina C Lopez, CMA Subject: RE: labs                                       His Mag level is still borderline low. I'd like him to increase his magnesium supplement to 400mg twice a day (currently taking once).   Thanks!  ----- Message ----- From: Lopez, Marina C, CMA Sent: 05/22/2023   9:11 AM EDT To: Bryan Ferrante, NP Subject: FW: labs                                       Just FYI No lab work available at this time. ----- Message ----- From: Newcomer McClain, Brandy L Sent: 05/19/2023  10:00 AM EDT To: Marina C Lopez, CMA Subject: labs                                           Will you please let Bryan Strong know if you see this patient's labs result from [facility] (Dr. Babaoff's office)? They are being drawn today. She is looking for his mag level and CMP. THANKS Marina!!!!! :)     

## 2023-05-22 NOTE — Telephone Encounter (Signed)
-----   Message from Sherie Reynard, NP sent at 05/22/2023 11:04 AM EDT ----- Regarding: RE: labs His Mag level is still borderline low. I'd like him to increase his magnesium supplement to 400mg  twice a day (currently taking once).   Thanks!  ----- Message ----- From: Bryan Strong, CMA Sent: 05/22/2023   9:11 AM EDT To: Sherie Maximiano, NP Subject: FW: labs                                       Just FYI No lab work available at this time. ----- Message ----- From: Bryan Strong, Bryan Strong Sent: 05/19/2023  10:00 AM EDT To: Bryan Strong, CMA Subject: labs                                           Will you please let Bryan Strong know if you see this patient's labs result from [facility] (Dr. Pilar Plate office)? They are being drawn today. She is looking for his mag level and CMP. Lynelle Doctor!!!!! :)

## 2023-05-22 NOTE — Telephone Encounter (Signed)
-----   Message from Kendrick Fries, [place] sent at 05/22/2023 11:25 AM EDT ----- Regarding: RE: labs Thank you for checking again looks like they are appearing now. I reached out to pt who wasn't available at this time I LM with pts wife. She will let him know to contact our office to discuss results. ----- Message ----- From: Sherie Brynn, NP Sent: 05/22/2023  11:06 AM EDT To: Kendrick Fries, CMA Subject: RE: labs                                       His Mag level is still borderline low. I'd like him to increase his magnesium supplement to 400mg  twice a day (currently taking once).   Thanks!  ----- Message ----- From: Kendrick Fries, CMA Sent: 05/22/2023   9:11 AM EDT To: Sherie Hyder, NP Subject: FW: labs                                       Just FYI No lab work available at this time. ----- Message ----- From: Thayer Headings, Ruthell Rummage Sent: 05/19/2023  10:00 AM EDT To: Kendrick Fries, CMA Subject: labs                                           Will you please let [name] know if you see this patient's labs result from [facility] (Dr. Pilar Plate office)? They are being drawn today. She is looking for his mag level and CMP. Lynelle Doctor!!!!! :)

## 2023-05-25 DIAGNOSIS — R400 Somnolence: Secondary | ICD-10-CM | POA: Diagnosis not present

## 2023-05-25 DIAGNOSIS — I2089 Other forms of angina pectoris: Secondary | ICD-10-CM | POA: Diagnosis not present

## 2023-05-25 DIAGNOSIS — Z1331 Encounter for screening for depression: Secondary | ICD-10-CM | POA: Diagnosis not present

## 2023-05-25 DIAGNOSIS — D696 Thrombocytopenia, unspecified: Secondary | ICD-10-CM | POA: Diagnosis not present

## 2023-05-25 DIAGNOSIS — Z79899 Other long term (current) drug therapy: Secondary | ICD-10-CM | POA: Diagnosis not present

## 2023-05-25 DIAGNOSIS — Z0000 Encounter for general adult medical examination without abnormal findings: Secondary | ICD-10-CM | POA: Diagnosis not present

## 2023-05-25 DIAGNOSIS — G4733 Obstructive sleep apnea (adult) (pediatric): Secondary | ICD-10-CM | POA: Diagnosis not present

## 2023-06-02 DIAGNOSIS — L570 Actinic keratosis: Secondary | ICD-10-CM | POA: Diagnosis not present

## 2023-06-02 DIAGNOSIS — D2272 Melanocytic nevi of left lower limb, including hip: Secondary | ICD-10-CM | POA: Diagnosis not present

## 2023-06-02 DIAGNOSIS — D225 Melanocytic nevi of trunk: Secondary | ICD-10-CM | POA: Diagnosis not present

## 2023-06-02 DIAGNOSIS — L728 Other follicular cysts of the skin and subcutaneous tissue: Secondary | ICD-10-CM | POA: Diagnosis not present

## 2023-06-02 DIAGNOSIS — D2271 Melanocytic nevi of right lower limb, including hip: Secondary | ICD-10-CM | POA: Diagnosis not present

## 2023-06-02 DIAGNOSIS — D2261 Melanocytic nevi of right upper limb, including shoulder: Secondary | ICD-10-CM | POA: Diagnosis not present

## 2023-06-02 DIAGNOSIS — D2262 Melanocytic nevi of left upper limb, including shoulder: Secondary | ICD-10-CM | POA: Diagnosis not present

## 2023-06-02 DIAGNOSIS — L821 Other seborrheic keratosis: Secondary | ICD-10-CM | POA: Diagnosis not present

## 2023-06-12 NOTE — Progress Notes (Signed)
[place] [facility] Recorder 

## 2023-06-15 ENCOUNTER — Ambulatory Visit (INDEPENDENT_AMBULATORY_CARE_PROVIDER_SITE_OTHER): Payer: PPO

## 2023-06-15 DIAGNOSIS — R55 Syncope and collapse: Secondary | ICD-10-CM | POA: Diagnosis not present

## 2023-06-15 LAB — CUP PACEART REMOTE DEVICE CHECK
Date Time Interrogation Session: 20240627001600
Implantable Pulse Generator Implant Date: 20230531
Pulse Gen Serial Number: 183770

## 2023-06-21 ENCOUNTER — Telehealth: Payer: Self-pay | Admitting: Cardiology

## 2023-06-21 MED ORDER — MAGNESIUM OXIDE 400 MG PO TABS: 1.0000 | ORAL_TABLET | Freq: Two times a day (BID) | ORAL | 3 refills | Status: DC

## 2023-06-21 NOTE — Telephone Encounter (Signed)
Pt c/o medication issue:  1. Name of Medication: magnesium oxide (MAG-OX) 400 MG tablet   2. How are you currently taking this medication (dosage and times per day)?    3. Are you having a reaction (difficulty breathing--STAT)? no  4. What is your medication issue? Calling to see if suppose to take medication once or twice. He was told twice but prescription says once. Please advise

## 2023-06-21 NOTE — Telephone Encounter (Signed)
Called and spoke with patient. Patient states that last month he was told to start taking his Magnesium twice daily.  When he picked his prescription up it was only for once daily. Patient requesting a prescription to be sent in for twice daily.   ---- Message from Sherie Kendrew, NP sent at 05/22/2023 11:04 AM EDT ----- Regarding: RE: labs His Mag level is still borderline low. I'd like him to increase his magnesium supplement to 400mg  twice a day (currently taking once).     Thanks!    Updated prescription sent to preferred pharmacy.

## 2023-06-24 DIAGNOSIS — G4733 Obstructive sleep apnea (adult) (pediatric): Secondary | ICD-10-CM | POA: Diagnosis not present

## 2023-06-24 DIAGNOSIS — R400 Somnolence: Secondary | ICD-10-CM | POA: Diagnosis not present

## 2023-06-29 NOTE — Progress Notes (Signed)
Carelink Summary Report / Loop Recorder 

## 2023-07-10 ENCOUNTER — Ambulatory Visit (INDEPENDENT_AMBULATORY_CARE_PROVIDER_SITE_OTHER): Payer: PPO | Admitting: Vascular Surgery

## 2023-07-10 ENCOUNTER — Encounter (INDEPENDENT_AMBULATORY_CARE_PROVIDER_SITE_OTHER): Payer: Self-pay | Admitting: Vascular Surgery

## 2023-07-10 ENCOUNTER — Ambulatory Visit (INDEPENDENT_AMBULATORY_CARE_PROVIDER_SITE_OTHER): Payer: PPO

## 2023-07-10 VITALS — BP 158/88 | HR 82 | Resp 18 | Ht 69.5 in | Wt 192.4 lb

## 2023-07-10 DIAGNOSIS — J449 Chronic obstructive pulmonary disease, unspecified: Secondary | ICD-10-CM

## 2023-07-10 DIAGNOSIS — I152 Hypertension secondary to endocrine disorders: Secondary | ICD-10-CM | POA: Diagnosis not present

## 2023-07-10 DIAGNOSIS — I7143 Infrarenal abdominal aortic aneurysm, without rupture: Secondary | ICD-10-CM | POA: Diagnosis not present

## 2023-07-10 DIAGNOSIS — I739 Peripheral vascular disease, unspecified: Secondary | ICD-10-CM

## 2023-07-10 DIAGNOSIS — E1159 Type 2 diabetes mellitus with other circulatory complications: Secondary | ICD-10-CM

## 2023-07-10 DIAGNOSIS — E119 Type 2 diabetes mellitus without complications: Secondary | ICD-10-CM

## 2023-07-10 DIAGNOSIS — I25118 Atherosclerotic heart disease of native coronary artery with other forms of angina pectoris: Secondary | ICD-10-CM | POA: Diagnosis not present

## 2023-07-10 NOTE — Progress Notes (Signed)
MRN : [id number]  Bryan Strong is a 77 y.o. (12-30-1945) male who presents with chief complaint of check circulation.  History of Present Illness:   The patient returns to the office for surveillance of a known abdominal aortic aneurysm. Patient denies abdominal pain or back pain, no other abdominal complaints. No changes suggesting embolic episodes.   There have been no interval changes in the patient's overall health care since his last visit.   Patient denies amaurosis fugax or TIA symptoms. There is no history of claudication or rest pain symptoms of the lower extremities. The patient denies angina or shortness of breath.   Duplex US of the aorta and iliac arteries today shows an AAA measured 2.91 cm with 1.20 cm left iliac artery aneurysm (previous AAA ultrasound showed an AAA measured 3.20 cm with 1.20 cm left iliac artery aneurysm.    No outpatient medications have been marked as taking for the 07/10/23 encounter (Appointment) with Gilda Crease, Latina Craver, MD.    Past Medical History:  Diagnosis Date   A-fib [facility])    Achalasia of esophagus    Allergic rhinitis    Anemia    Anginal pain (HCC)    Aortic aneurysm (HCC)    followed by Dr. Marena Chancy   Coronary artery disease    Diabetes mellitus without complication (HCC)    Dyspnea    Emphysema of lung (HCC)    GERD (gastroesophageal reflux disease)    Heart murmur    Hypercholesteremia    Hypertension    Kidney stones    Skin cancer    Sleep apnea    CPAP   Wears dentures    full upper and lower    Past Surgical History:  Procedure Laterality Date   CHOLECYSTECTOMY     colon polyps     COLONOSCOPY WITH PROPOFOL N/A 09/18/2017   Procedure: COLONOSCOPY WITH PROPOFOL;  Surgeon: Scot Jun, MD;  Location: [facility] ENDOSCOPY;  Service: Endoscopy;  Laterality: N/A;   CORONARY ANGIOPLASTY WITH STENT PLACEMENT  01/03/11   [facility]  Dr. Darrold Junker   ECTROPION  REPAIR Bilateral 05/03/2016   Procedure: REPAIR OF ECTROPION BILATERAL LOWER EYELIDS;  Surgeon: Imagene Riches, MD;  Location: [facility] SURGERY CNTR;  Service: Ophthalmology;  Laterality: Bilateral;   ESOPHAGOGASTRODUODENOSCOPY (EGD) WITH PROPOFOL N/A 07/21/2017   Procedure: ESOPHAGOGASTRODUODENOSCOPY (EGD) WITH PROPOFOL;  Surgeon: Scot Jun, MD;  Location: [facility] ENDOSCOPY;  Service: Endoscopy;  Laterality: N/A;   ESOPHAGOGASTRODUODENOSCOPY (EGD) WITH PROPOFOL Bilateral 10/18/2017   Procedure: ESOPHAGOGASTRODUODENOSCOPY (EGD) WITH PROPOFOL;  Surgeon: Scot Jun, MD;  Location: [facility] ENDOSCOPY;  Service: Endoscopy;  Laterality: Bilateral;   HERNIA REPAIR     LEFT HEART CATH Left 06/08/2017   Procedure: Left Heart Cath and Coronary Angiography;  Surgeon: Alwyn Pea, MD;  Location: [facility] INVASIVE CV LAB;  Service: Cardiovascular;  Laterality: Left;   LEFT HEART CATH AND CORONARY ANGIOGRAPHY N/A 08/26/2022   Procedure: LEFT HEART CATH AND CORONARY ANGIOGRAPHY;  Surgeon: Corky Crafts, MD;  Location: [facility] INVASIVE CV LAB;  Service: Cardiovascular;  Laterality: N/A;   LID LESION EXCISION  Bilateral 05/03/2016   Procedure: ,EXCISION CYST RIGHT UPPER EYELID;  Surgeon: Imagene Riches, MD;  Location: [facility] SURGERY CNTR;  Service: Ophthalmology;  Laterality: Bilateral;  Diabetic - oral meds CPAP   NASAL SEPTUM SURGERY  1967   SAVORY DILATION N/A 07/21/2017   Procedure: SAVORY DILATION;  Surgeon: Scot Jun, MD;  Location: [facility] ENDOSCOPY;  Service: Endoscopy;  Laterality: N/A;    Social History Social History   Tobacco Use   Smoking status: Former    Current packs/day: 0.00    Average packs/day: 1 pack/day for 56.0 years (56.0 ttl pk-yrs)    Types: Cigarettes    Start date: 11/04/1959    Quit date: 11/04/2015    Years since quitting: 7.6   Smokeless tobacco: Never  Vaping Use   Vaping status: Never Used  Substance Use Topics   Alcohol use: No   Drug use: No    Family  History Family History  Problem Relation Age of Onset   Diabetes Brother    Diabetes Maternal Aunt    Diabetes Maternal Uncle    Diabetes Maternal Grandmother     Allergies  Allergen Reactions   Erythromycin Base Rash   Other Rash    ERYTHROMYCIN OINTMENT "some nerve pill, caused HTN"     REVIEW OF SYSTEMS (Negative unless checked)  Constitutional: [] Weight loss  [] Fever  [] Chills Cardiac: [] Chest pain   [] Chest pressure   [] Palpitations   [] Shortness of breath when laying flat   [] Shortness of breath with exertion. Vascular:  [] Pain in legs with walking   [] Pain in legs at rest  [] History of DVT   [] Phlebitis   [] Swelling in legs   [] Varicose veins   [] Non-healing ulcers Pulmonary:   [] Uses home oxygen   [] Productive cough   [] Hemoptysis   [] Wheeze  [x] COPD   [] Asthma Neurologic:  [] Dizziness   [] Seizures   [] History of stroke   [] History of TIA  [] Aphasia   [] Vissual changes   [] Weakness or numbness in arm   [] Weakness or numbness in leg Musculoskeletal:   [] Joint swelling   [] Joint pain   [] Low back pain Hematologic:  [] Easy bruising  [] Easy bleeding   [] Hypercoagulable state   [] Anemic Gastrointestinal:  [] Diarrhea   [] Vomiting  [x] Gastroesophageal reflux/heartburn   [] Difficulty swallowing. Genitourinary:  [] Chronic kidney disease   [] Difficult urination  [] Frequent urination   [] Blood in urine Skin:  [] Rashes   [] Ulcers  Psychological:  [] History of anxiety   []  History of major depression.  Physical Examination  There were no vitals filed for this visit. There is no height or weight on file to calculate BMI. Gen: WD/WN, NAD Head: [place]/AT, No temporalis wasting.  Ear/Nose/Throat: Hearing grossly intact, nares w/o erythema or drainage Eyes: PER, EOMI, sclera nonicteric.  Neck: Supple, no masses.  No bruit or JVD.  Pulmonary:  Good air movement, no audible wheezing, no use of accessory muscles.  Cardiac: RRR, normal S1, S2, no Murmurs. Vascular:  mild trophic changes, no  open wounds Vessel Right Left  Radial Palpable Palpable  PT Not Palpable Not Palpable  DP Not Palpable Not Palpable  Gastrointestinal: soft, non-distended. No guarding/no peritoneal signs.  Musculoskeletal: M/S 5/5 throughout.  No visible deformity.  Neurologic: CN 2-12 intact. Pain and light touch intact in extremities.  Symmetrical.  Speech is fluent. Motor exam as listed above. Psychiatric: Judgment intact, Mood & affect appropriate for pt's clinical situation. Dermatologic: No rashes or ulcers noted.  No changes consistent with cellulitis.   CBC  Lab Results  Component Value Date   WBC 3.9 (L) 09/20/2022   HGB 12.9 (L) 09/20/2022   HCT 37.6 (L) 09/20/2022   MCV 92.6 09/20/2022   PLT 109 (L) 09/20/2022    BMET    Component Value Date/Time   NA 137 11/23/2022 1201   NA 140 01/11/2013 2241   K 4.3 11/23/2022 1201   K 3.7 01/11/2013 2241   CL 101 11/23/2022 1201   CL 105 01/11/2013 2241   CO2 30 11/23/2022 1201   CO2 29 01/11/2013 2241   GLUCOSE 279 (H) 11/23/2022 1201   GLUCOSE 219 (H) 01/11/2013 2241   BUN 13 11/23/2022 1201   BUN 14 01/11/2013 2241   CREATININE 0.92 11/23/2022 1201   CREATININE 1.06 01/11/2013 2241   CALCIUM 9.4 11/23/2022 1201   CALCIUM 9.4 01/11/2013 2241   GFRNONAA >60 11/23/2022 1201   GFRNONAA >60 01/11/2013 2241   GFRAA >60 06/11/2020 1244   GFRAA >60 01/11/2013 2241   CrCl cannot be calculated (Patient's most recent lab result is older than the maximum 21 days allowed.).  COAG No results found for: "INR", "PROTIME"  Radiology CUP PACEART REMOTE DEVICE CHECK  Result Date: 06/15/2023 ILR summary report received. Battery status OK. Normal device function. No new symptom, tachy, brady, or pause episodes. No new AF episodes.  AF burden is 0% of the time.   Monthly summary reports and ROV/PRN Hassell Halim, RN, CCDS, [employer]    Assessment/Plan 1. Infrarenal abdominal aortic aneurysm (AAA) without rupture (HCC) Recommend: No  surgery or intervention is indicated at this time.   The patient has an asymptomatic abdominal aortic aneurysm that is less than 4 cm in maximal diameter.     I have reviewed the natural history of abdominal aortic aneurysm and the small risk of rupture for aneurysm less than 5 cm in size.   However, as these small aneurysms tend to enlarge over time, continued surveillance with ultrasound or CT scan is mandatory.    I have also discussed optimizing medical management with hypertension and lipid control and the importance of abstinence from tobacco.  The patient is also encouraged to exercise a minimum of 30 minutes 4 times a week.    Should the patient develop new onset abdominal or back pain or signs of peripheral embolization they are instructed to seek medical attention immediately and to alert the physician providing care that they have an aneurysm.    The patient voices their understanding.   The patient will return in 12 months with an aortic duplex.  - VAS US AORTA/IVC/ILIACS; Future  2. PVD (peripheral vascular disease) (HCC) Recommend:  I do not find evidence of life style limiting vascular disease. The patient specifically denies life style limitation.  Previous noninvasive studies including ABI's of the legs do not identify critical vascular problems.  The patient should continue walking and begin a more formal exercise program. The patient should continue his antiplatelet therapy and aggressive treatment of the lipid abnormalities.  The patient is instructed to call the office if there is a significant change in the lower extremity symptoms, particularly if a wound develops or there is an abrupt increase in leg pain.  3. Hypertension associated with diabetes (HCC) Continue antihypertensive medications as already ordered, these medications have been reviewed and there are no changes at this time.  4. Coronary artery disease of native artery of native heart with stable  angina pectoris (HCC) Continue cardiac and antihypertensive medications as already ordered  and reviewed, no changes at this time.  Continue statin as ordered and reviewed, no changes at this time  Nitrates PRN for chest pain  5. COPD, moderate (HCC) Continue pulmonary medications and aerosols as already ordered, these medications have been reviewed and there are no changes at this time.   6. Type 2 diabetes mellitus without complication, unspecified whether long term insulin use (HCC) Continue hypoglycemic medications as already ordered, these medications have been reviewed and there are no changes at this time.  Hgb A1C to be monitored as already arranged by primary service    Levora Dredge, MD  07/10/2023 8:40 AM

## 2023-07-11 ENCOUNTER — Other Ambulatory Visit: Payer: Self-pay | Admitting: Radiation Oncology

## 2023-07-12 ENCOUNTER — Other Ambulatory Visit: Payer: Self-pay | Admitting: *Deleted

## 2023-07-12 MED ORDER — TAMSULOSIN HCL 0.4 MG PO CAPS: 0.4000 mg | ORAL_CAPSULE | Freq: Every day | ORAL | 4 refills | Status: DC

## 2023-07-17 ENCOUNTER — Ambulatory Visit (INDEPENDENT_AMBULATORY_CARE_PROVIDER_SITE_OTHER): Payer: PPO

## 2023-07-17 DIAGNOSIS — I480 Paroxysmal atrial fibrillation: Secondary | ICD-10-CM | POA: Diagnosis not present

## 2023-07-17 LAB — CUP PACEART REMOTE DEVICE CHECK
Date Time Interrogation Session: 20240729013200
Implantable Pulse Generator Implant Date: 20230531
Pulse Gen Serial Number: 183770

## 2023-07-24 DIAGNOSIS — E119 Type 2 diabetes mellitus without complications: Secondary | ICD-10-CM | POA: Diagnosis not present

## 2023-07-24 DIAGNOSIS — H52223 Regular astigmatism, bilateral: Secondary | ICD-10-CM | POA: Diagnosis not present

## 2023-07-24 DIAGNOSIS — H524 Presbyopia: Secondary | ICD-10-CM | POA: Diagnosis not present

## 2023-07-24 DIAGNOSIS — H5213 Myopia, bilateral: Secondary | ICD-10-CM | POA: Diagnosis not present

## 2023-07-24 DIAGNOSIS — Z7984 Long term (current) use of oral hypoglycemic drugs: Secondary | ICD-10-CM | POA: Diagnosis not present

## 2023-07-24 DIAGNOSIS — H2513 Age-related nuclear cataract, bilateral: Secondary | ICD-10-CM | POA: Diagnosis not present

## 2023-08-01 ENCOUNTER — Telehealth: Payer: Self-pay | Admitting: Cardiology

## 2023-08-01 NOTE — Telephone Encounter (Signed)
Spoke with the patient who states that his feet and ankles have been swollen for the past couple of weeks. He is not having any shortness of breath or chest pain. He does not think that he has gained any weight. He does report that he eats takeout often. He has not been elevating his feet. No recent medication changes.  Advised patient on low sodium diet and feet elevation. Patient see [name] for general cardiology and I have advised him to contact him for further recommendations

## 2023-08-01 NOTE — Telephone Encounter (Signed)
Pt c/o swelling: STAT is pt has developed SOB within 24 hours  How much weight have you gained and in what time span?  No weight gain   If swelling, where is the swelling located?  Feet, ankles, legs   Are you currently taking a fluid pill?  No   Are you currently SOB? No   Do you have a log of your daily weights (if so, list)?    Have you gained 3 pounds in a day or 5 pounds in a week?   Have you traveled recently?  No

## 2023-08-02 ENCOUNTER — Inpatient Hospital Stay: Payer: PPO | Attending: Radiation Oncology

## 2023-08-02 DIAGNOSIS — C61 Malignant neoplasm of prostate: Secondary | ICD-10-CM | POA: Insufficient documentation

## 2023-08-02 LAB — PSA: Prostatic Specific Antigen: 0.33 ng/mL (ref 0.00–4.00)

## 2023-08-03 NOTE — Progress Notes (Signed)
[place] [facility] Recorder 

## 2023-08-04 ENCOUNTER — Other Ambulatory Visit: Payer: Self-pay | Admitting: Internal Medicine

## 2023-08-04 ENCOUNTER — Ambulatory Visit
Admission: RE | Admit: 2023-08-04 | Discharge: 2023-08-04 | Disposition: A | Discharge status: Home / Self Care | Payer: PPO | Source: Ambulatory Visit | Attending: Internal Medicine | Admitting: Internal Medicine

## 2023-08-04 DIAGNOSIS — E119 Type 2 diabetes mellitus without complications: Secondary | ICD-10-CM | POA: Diagnosis not present

## 2023-08-04 DIAGNOSIS — K219 Gastro-esophageal reflux disease without esophagitis: Secondary | ICD-10-CM | POA: Diagnosis not present

## 2023-08-04 DIAGNOSIS — M79604 Pain in right leg: Secondary | ICD-10-CM

## 2023-08-04 DIAGNOSIS — R600 Localized edema: Secondary | ICD-10-CM | POA: Diagnosis not present

## 2023-08-04 DIAGNOSIS — G4733 Obstructive sleep apnea (adult) (pediatric): Secondary | ICD-10-CM | POA: Diagnosis not present

## 2023-08-04 DIAGNOSIS — I2089 Other forms of angina pectoris: Secondary | ICD-10-CM | POA: Diagnosis not present

## 2023-08-04 DIAGNOSIS — I10 Essential (primary) hypertension: Secondary | ICD-10-CM | POA: Diagnosis not present

## 2023-08-04 DIAGNOSIS — I4729 Other ventricular tachycardia: Secondary | ICD-10-CM | POA: Diagnosis not present

## 2023-08-04 DIAGNOSIS — M7989 Other specified soft tissue disorders: Secondary | ICD-10-CM | POA: Insufficient documentation

## 2023-08-04 DIAGNOSIS — I739 Peripheral vascular disease, unspecified: Secondary | ICD-10-CM | POA: Diagnosis not present

## 2023-08-04 DIAGNOSIS — I471 Supraventricular tachycardia, unspecified: Secondary | ICD-10-CM | POA: Diagnosis not present

## 2023-08-04 DIAGNOSIS — J439 Emphysema, unspecified: Secondary | ICD-10-CM | POA: Diagnosis not present

## 2023-08-04 DIAGNOSIS — I2510 Atherosclerotic heart disease of native coronary artery without angina pectoris: Secondary | ICD-10-CM | POA: Diagnosis not present

## 2023-08-09 ENCOUNTER — Ambulatory Visit
Admission: RE | Admit: 2023-08-09 | Discharge: 2023-08-09 | Disposition: A | Discharge status: Home / Self Care | Payer: PPO | Source: Ambulatory Visit | Attending: Radiation Oncology | Admitting: Radiation Oncology

## 2023-08-09 ENCOUNTER — Other Ambulatory Visit: Payer: Self-pay | Admitting: *Deleted

## 2023-08-09 ENCOUNTER — Encounter: Payer: Self-pay | Admitting: Radiation Oncology

## 2023-08-09 VITALS — BP 148/89 | HR 59 | Temp 97.7°F | Resp 18 | Ht 69.5 in | Wt 196.9 lb

## 2023-08-09 DIAGNOSIS — M7989 Other specified soft tissue disorders: Secondary | ICD-10-CM | POA: Diagnosis not present

## 2023-08-09 DIAGNOSIS — Z191 Hormone sensitive malignancy status: Secondary | ICD-10-CM | POA: Diagnosis not present

## 2023-08-09 DIAGNOSIS — Z923 Personal history of irradiation: Secondary | ICD-10-CM | POA: Insufficient documentation

## 2023-08-09 DIAGNOSIS — C61 Malignant neoplasm of prostate: Secondary | ICD-10-CM

## 2023-08-09 NOTE — Progress Notes (Signed)
Radiation Oncology Follow up Note  Name: Bryan Strong   Date:   08/09/2023 MRN:  [id number] DOB: [birth year]    This 77 y.o. male presents to the clinic today for 34-month follow-up status post IMRT radiation therapy for Gleason 7 (4+3) adenocarcinoma the prostate presenting with a PSA of 9.  REFERRING PROVIDER: Kandyce Rud, MD  HPI: Patient is a 77 year old male now out 10 months having completed IMRT radiation therapy to his prostate for Gleason 7 adenocarcinoma.  Seen today in follow-up he is doing well.  He states his urinary symptoms have improved he is having no specific diarrhea..  His PSA is dropped from 1.2 down to 0.33 over the past 6 months.  He does take Flomax.  Recently had some swelling in his lower extremities Doppler study showed no evidence to suggest DVT.  COMPLICATIONS OF TREATMENT: none  FOLLOW UP COMPLIANCE: keeps appointments   PHYSICAL EXAM:  BP (!) 148/89   Pulse (!) 59   Temp 97.7 F (36.5 C)   Resp 18   Ht 5' 9.5" (1.765 m)   Wt 196 lb 14.4 oz (89.3 kg)   BMI 28.66 kg/m  Well-developed well-nourished patient in NAD. HEENT reveals PERLA, EOMI, discs not visualized.  Oral cavity is clear. No oral mucosal lesions are identified. Neck is clear without evidence of cervical or supraclavicular adenopathy. Lungs are clear to A&P. Cardiac examination is essentially unremarkable with regular rate and rhythm without murmur rub or thrill. Abdomen is benign with no organomegaly or masses noted. Motor sensory and DTR levels are equal and symmetric in the upper and lower extremities. Cranial nerves II through XII are grossly intact. Proprioception is intact. No peripheral adenopathy or edema is identified. No motor or sensory levels are noted. Crude visual fields are within normal range.  RADIOLOGY RESULTS: No current films for review  PLAN: Present time patient is doing well under excellent biochemical control of his prostate cancer.  I have asked to see him back in 6  months with repeat PSA.  Patient is to call with any concerns.  I would like to take this opportunity to thank you for allowing me to participate in the care of your patient.Carmina Miller, MD

## 2023-08-17 ENCOUNTER — Ambulatory Visit (INDEPENDENT_AMBULATORY_CARE_PROVIDER_SITE_OTHER): Payer: PPO

## 2023-08-17 DIAGNOSIS — R600 Localized edema: Secondary | ICD-10-CM | POA: Diagnosis not present

## 2023-08-17 DIAGNOSIS — R55 Syncope and collapse: Secondary | ICD-10-CM

## 2023-08-17 DIAGNOSIS — M79672 Pain in left foot: Secondary | ICD-10-CM | POA: Diagnosis not present

## 2023-08-17 DIAGNOSIS — E119 Type 2 diabetes mellitus without complications: Secondary | ICD-10-CM | POA: Diagnosis not present

## 2023-08-17 DIAGNOSIS — B351 Tinea unguium: Secondary | ICD-10-CM | POA: Diagnosis not present

## 2023-08-17 DIAGNOSIS — M79671 Pain in right foot: Secondary | ICD-10-CM | POA: Diagnosis not present

## 2023-08-17 LAB — CUP PACEART REMOTE DEVICE CHECK
Date Time Interrogation Session: 20240829004700
Implantable Pulse Generator Implant Date: 20230531
Pulse Gen Serial Number: 183770

## 2023-08-25 NOTE — Progress Notes (Signed)
Carelink Summary Report / Loop Recorder 

## 2023-09-18 LAB — CUP PACEART REMOTE DEVICE CHECK
Date Time Interrogation Session: 20240930010200
Implantable Pulse Generator Implant Date: 20230531
Pulse Gen Serial Number: 183770

## 2023-09-24 DIAGNOSIS — R400 Somnolence: Secondary | ICD-10-CM | POA: Diagnosis not present

## 2023-09-24 DIAGNOSIS — G4733 Obstructive sleep apnea (adult) (pediatric): Secondary | ICD-10-CM | POA: Diagnosis not present

## 2023-10-09 ENCOUNTER — Other Ambulatory Visit: Payer: Self-pay | Admitting: Cardiology

## 2023-10-14 ENCOUNTER — Other Ambulatory Visit: Payer: Self-pay

## 2023-10-14 ENCOUNTER — Emergency Department
Admission: EM | Admit: 2023-10-14 | Discharge: 2023-10-15 | Disposition: A | Discharge status: Home / Self Care | Payer: PPO | Attending: Emergency Medicine | Admitting: Emergency Medicine

## 2023-10-14 ENCOUNTER — Encounter: Payer: Self-pay | Admitting: Emergency Medicine

## 2023-10-14 DIAGNOSIS — Z8546 Personal history of malignant neoplasm of prostate: Secondary | ICD-10-CM | POA: Diagnosis not present

## 2023-10-14 DIAGNOSIS — Z7982 Long term (current) use of aspirin: Secondary | ICD-10-CM | POA: Diagnosis not present

## 2023-10-14 DIAGNOSIS — E119 Type 2 diabetes mellitus without complications: Secondary | ICD-10-CM | POA: Diagnosis not present

## 2023-10-14 DIAGNOSIS — I10 Essential (primary) hypertension: Secondary | ICD-10-CM | POA: Diagnosis not present

## 2023-10-14 DIAGNOSIS — Z7902 Long term (current) use of antithrombotics/antiplatelets: Secondary | ICD-10-CM | POA: Insufficient documentation

## 2023-10-14 DIAGNOSIS — R55 Syncope and collapse: Secondary | ICD-10-CM | POA: Diagnosis not present

## 2023-10-14 DIAGNOSIS — W19XXXA Unspecified fall, initial encounter: Secondary | ICD-10-CM | POA: Diagnosis not present

## 2023-10-14 NOTE — ED Triage Notes (Signed)
First RN Note: Pt to ED via [facility] from home with c/o syncope and fall. Per EMS pt stood up and then was on the floor. Per EMS pt was found sitting up against the fridge, does take plavix.    190/80->160/70

## 2023-10-14 NOTE — ED Triage Notes (Signed)
Pt reports has been working outside all day, thinks he potentially became dehydrated while working. Pt presents alert and oriented, able to answer all questions at this time.

## 2023-10-15 LAB — CBC
HCT: 38.2 % — ABNORMAL LOW (ref 39.0–52.0)
Hemoglobin: 13 g/dL (ref 13.0–17.0)
MCH: 32.2 pg (ref 26.0–34.0)
MCHC: 34 g/dL (ref 30.0–36.0)
MCV: 94.6 fL (ref 80.0–100.0)
Platelets: 138 10*3/uL — ABNORMAL LOW (ref 150–400)
RBC: 4.04 MIL/uL — ABNORMAL LOW (ref 4.22–5.81)
RDW: 13.6 % (ref 11.5–15.5)
WBC: 4.1 10*3/uL (ref 4.0–10.5)
nRBC: 0 % (ref 0.0–0.2)

## 2023-10-15 LAB — URINALYSIS, ROUTINE W REFLEX MICROSCOPIC
Bacteria, UA: NONE SEEN
Bilirubin Urine: NEGATIVE
Glucose, UA: >=500 mg/dL — AB
Hgb urine dipstick: NEGATIVE
Ketones, ur: NEGATIVE mg/dL
Leukocytes,Ua: NEGATIVE
Nitrite: NEGATIVE
Protein, ur: NEGATIVE mg/dL
Specific Gravity, Urine: 1.022 (ref 1.005–1.030)
Squamous Epithelial / HPF: 0 /[HPF] (ref 0–5)
pH: 5 (ref 5.0–8.0)

## 2023-10-15 LAB — COMPREHENSIVE METABOLIC PANEL
ALT: 20 U/L (ref 0–44)
AST: 27 U/L (ref 15–41)
Albumin: 4.3 g/dL (ref 3.5–5.0)
Alkaline Phosphatase: 96 U/L (ref 38–126)
Anion gap: 8 (ref 5–15)
BUN: 20 mg/dL (ref 8–23)
CO2: 24 mmol/L (ref 22–32)
Calcium: 9.2 mg/dL (ref 8.9–10.3)
Chloride: 105 mmol/L (ref 98–111)
Creatinine, Ser: 0.99 mg/dL (ref 0.61–1.24)
GFR, Estimated: >60 mL/min (ref >60)
Glucose, Bld: 282 mg/dL — ABNORMAL HIGH (ref 70–99)
Potassium: 3.8 mmol/L (ref 3.5–5.1)
Sodium: 137 mmol/L (ref 135–145)
Total Bilirubin: 1.1 mg/dL (ref 0.3–1.2)
Total Protein: 7.1 g/dL (ref 6.5–8.1)

## 2023-10-15 LAB — TROPONIN I (HIGH SENSITIVITY)
Troponin I (High Sensitivity): 8 ng/L (ref <18)
Troponin I (High Sensitivity): 8 ng/L (ref <18)

## 2023-10-15 MED ORDER — SODIUM CHLORIDE 0.9 % IV BOLUS
500.0000 mL | Freq: Once | INTRAVENOUS | Status: AC
Start: 2023-10-15 — End: 2023-10-15
  Administered 2023-10-15: 500 mL via INTRAVENOUS

## 2023-10-15 NOTE — ED Notes (Signed)
Sent RN secure message advising pts tele monitor shows irregular HR, all other vitals are within normal range. RN advised will check on pt.

## 2023-10-15 NOTE — ED Notes (Signed)
Pt ambulated from the room, in the hallway and back to his room without any dizziness or difficulty. Pt sts his left leg was sore from the fall today but still was able to ambulate on it.

## 2023-10-15 NOTE — ED Provider Notes (Signed)
[facility] Provider Note    Event Date/Time   First MD Initiated Contact with Patient 10/14/23 2348     (approximate)   History   Loss of Consciousness   HPI  Bryan Strong is a 77 y.o. male who presents to the ED for evaluation of Loss of Consciousness   Review of cardiology clinic visit from 2 months ago.  Questionable paroxysmal A-fib, has a loop recorder placed by EP.  History of prostate cancer.  DM.  Aspirin and Plavix but no AC. Also reviewed most recent EP Loop recorder interrogation from 9/30 without dysrhythmias.  Patient presents to the ED for evaluation of an episode of dizziness and possible syncope.  He reports a long day today working outside with minimal intake of food and water.  After he got home, he reports standing from a seated position and feeling presyncopal dizziness and then briefly losing consciousness and waking up on the floor.  He does not think he hit anything and reports feeling fine without pain such as chest or back pain, headache or any concerns for extremity trauma.  After drinking some water with triage she reports feeling fine and has no complaints or concerns.  He thinks he just got dehydrated.  Physical Exam   Triage Vital Signs: ED Triage Vitals  Encounter Vitals Group     BP 10/14/23 2343 (!) 147/87     Systolic BP Percentile --      Diastolic BP Percentile --      Pulse Rate 10/14/23 2343 87     Resp 10/14/23 2343 17     Temp 10/14/23 2343 98.4 F (36.9 C)     Temp Source 10/14/23 2343 Oral     SpO2 10/14/23 2343 95 %     Weight 10/14/23 2345 190 lb (86.2 kg)     Height 10/14/23 2345 5' 9.5" (1.765 m)     Head Circumference --      Peak Flow --      Pain Score 10/14/23 2344 0     Pain Loc --      Pain Education --      Exclude from Growth Chart --     Most recent vital signs: Vitals:   10/14/23 2343 10/15/23 0130  BP: (!) 147/87 (!) 152/78  Pulse: 87 66  Resp: 17 14  Temp: 98.4 F (36.9 C)    SpO2: 95% 99%    General: Awake, no distress.  CV:  Good peripheral perfusion.  Resp:  Normal effort.  Abd:  No distention.  MSK:  No deformity noted.  No signs of trauma to the extremities or back. Neuro:  No focal deficits appreciated. Cranial nerves II through XII intact 5/5 strength and sensation in all 4 extremities Other:     ED Results / Procedures / Treatments   Labs (all labs ordered are listed, but only abnormal results are displayed) Labs Reviewed  CBC - Abnormal; Notable for the following components:      Result Value   RBC 4.04 (*)    HCT 38.2 (*)    Platelets 138 (*)    All other components within normal limits  URINALYSIS, ROUTINE W REFLEX MICROSCOPIC - Abnormal; Notable for the following components:   Color, Urine YELLOW (*)    APPearance CLEAR (*)    Glucose, UA >=500 (*)    All other components within normal limits  COMPREHENSIVE METABOLIC PANEL - Abnormal; Notable for the following components:   Glucose,  Bld 282 (*)    All other components within normal limits  TROPONIN I (HIGH SENSITIVITY)  TROPONIN I (HIGH SENSITIVITY)    EKG Sinus rhythm with a rate of 87 bpm.  Normal axis and intervals.  No clear signs of acute ischemia.  RADIOLOGY   Official radiology report(s): No results found.  PROCEDURES and INTERVENTIONS:  .1-3 Lead EKG Interpretation  Performed by: Delton Prairie, MD Authorized by: Delton Prairie, MD     Interpretation: normal     ECG rate:  70   ECG rate assessment: normal     Rhythm: sinus rhythm     Ectopy: none     Conduction: normal     Medications  sodium chloride 0.9 % bolus 500 mL (0 mLs Intravenous Stopped 10/15/23 0230)     IMPRESSION / MDM / ASSESSMENT AND PLAN / ED COURSE  I reviewed the triage vital signs and the nursing notes.  Differential diagnosis includes, but is not limited to, cardiac dysrhythmia, vasovagal episode, stroke, seizure, mechanical fall  {Patient presents with symptoms of an acute illness  or injury that is potentially life-threatening.  Patient presents after a syncopal episode that I suspect was a vasovagal episode and suitable for trial of outpatient management.  He looks well without clear signs of trauma and no signs of neurologic deficits.  Remains with normal vital signs with a sinus rhythm on the monitor without signs of A-fib.  Blood work with mild hyperglycemia without acidosis, 2 negative troponins and a normal CBC.  Clear urine.  I considered observation admission for the patient and discussed this with him but he is preferring outpatient management, which I think is reasonable and he looks really well.  Ambulates without issues, tolerating p.o.  We discussed close return precautions and PCP follow-up.  He is suitable for outpatient management.  Clinical Course as of 10/15/23 0319  Wynelle Link Oct 15, 2023  [id number] Reassessed, patient sitting upright on the edge of the bed eating.  He reports feeling well and is appreciative.  He ambulated around without any dizziness, syncope or need for assistance.  He is comfortable going home.  We discussed possible etiologies of his symptoms and plan of care.  He reports he does not wish to be hospitalized, thinks is reasonable to attempt outpatient management. [DS]    Clinical Course User Index [DS] Delton Prairie, MD     FINAL CLINICAL IMPRESSION(S) / ED DIAGNOSES   Final diagnoses:  Vasovagal episode  Syncope and collapse     Rx / DC Orders   ED Discharge Orders     None        Note:  This document was prepared using Dragon voice recognition software and may include unintentional dictation errors.   Delton Prairie, MD 10/15/23 [phone]

## 2023-10-16 DIAGNOSIS — S92515A Nondisplaced fracture of proximal phalanx of left lesser toe(s), initial encounter for closed fracture: Secondary | ICD-10-CM | POA: Diagnosis not present

## 2023-10-16 DIAGNOSIS — E119 Type 2 diabetes mellitus without complications: Secondary | ICD-10-CM | POA: Diagnosis not present

## 2023-10-16 DIAGNOSIS — M79675 Pain in left toe(s): Secondary | ICD-10-CM | POA: Diagnosis not present

## 2023-10-17 ENCOUNTER — Telehealth: Payer: Self-pay | Admitting: Cardiology

## 2023-10-17 IMAGING — CR DG CHEST 2V
1 series · 2 of 2 positions shown · non-contrast
Comparison: CT chest 09/09/2021

CLINICAL DATA: Chest pain.  Dizziness.  Hypertension.

EXAM:
CHEST - 2 VIEW

[Series 1: dg chest 2 view · 0.14mm/px · 2 of 2 slices shown]
[im 1/2]
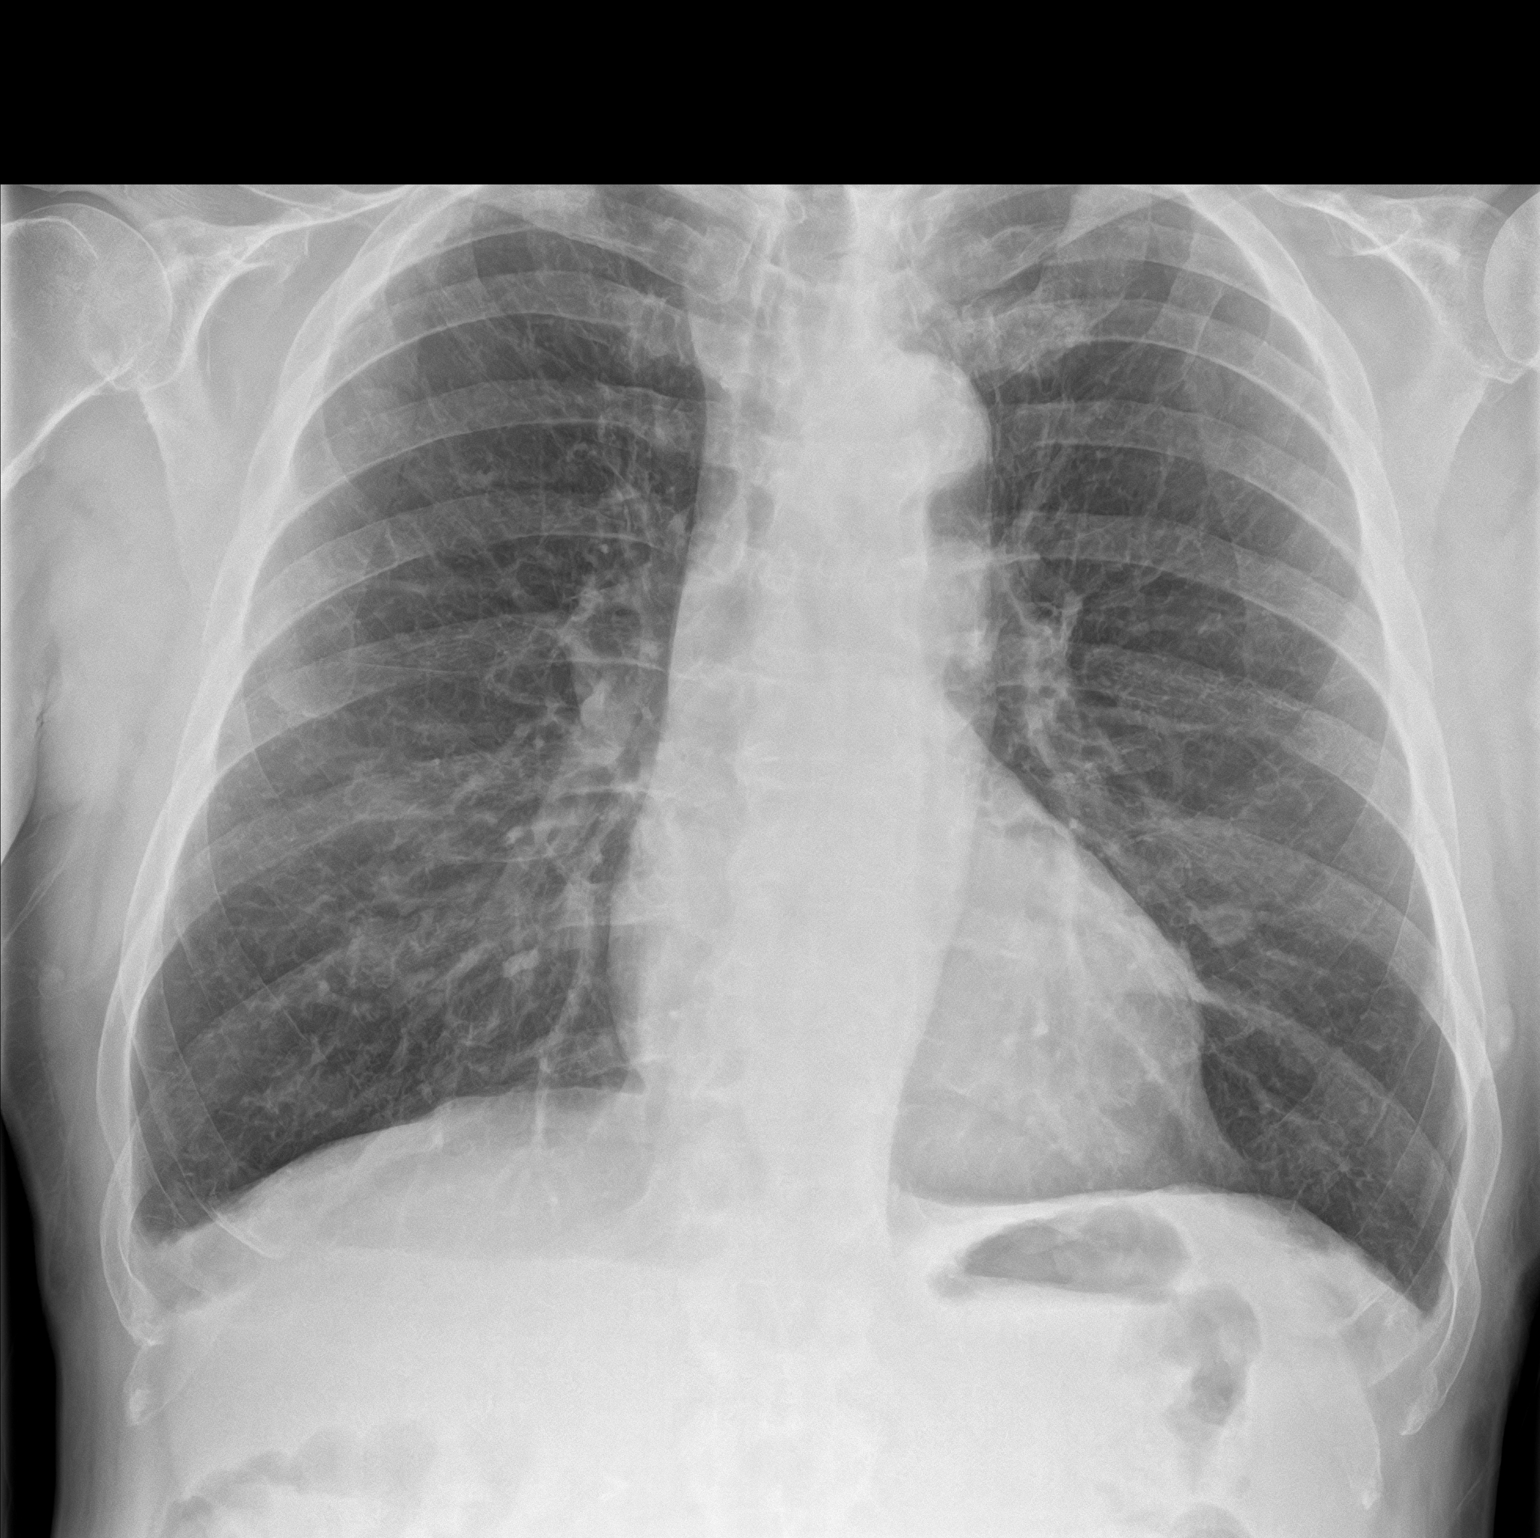
[im 2/2]
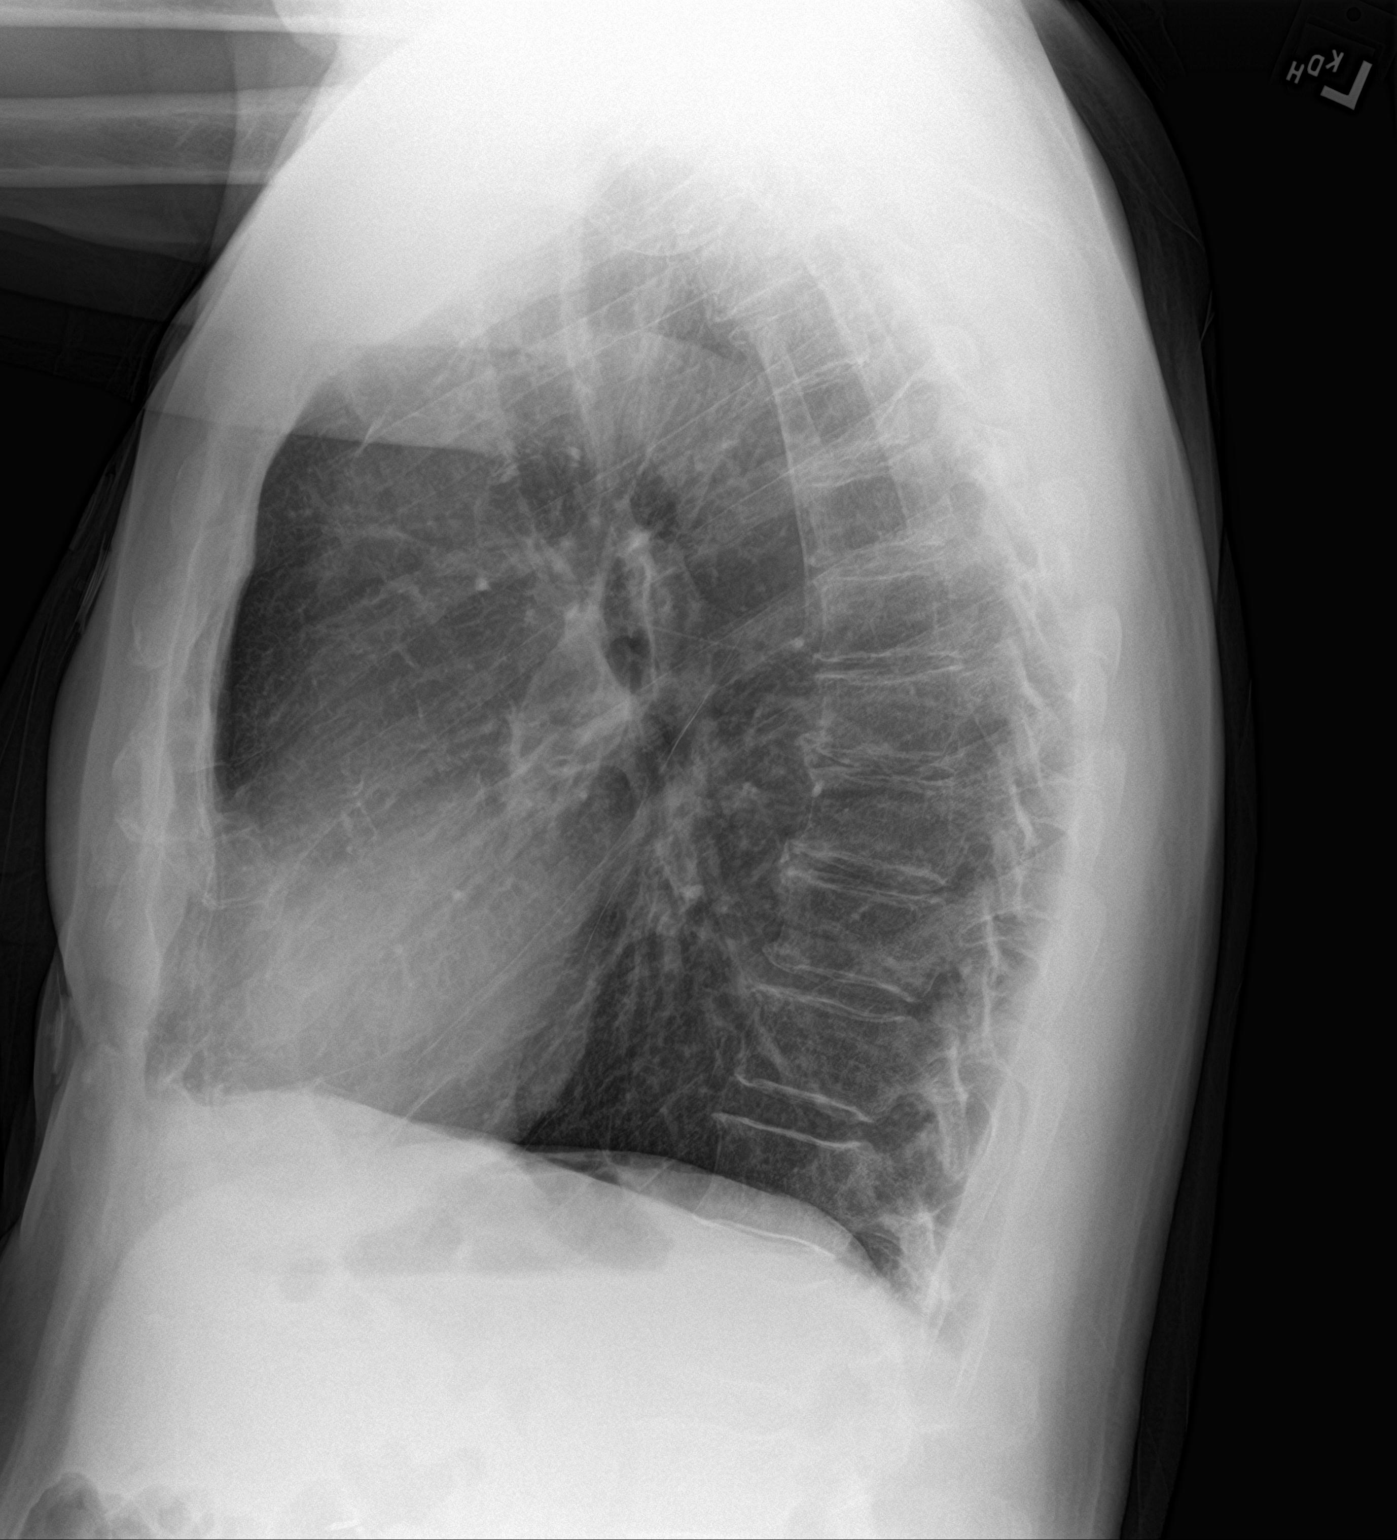

[2 of 2 positions shown; findings below may reference images not displayed]

FINDINGS: The heart size and mediastinal contours are within normal limits.
Pulmonary vascular congestion. No pleural effusion or edema. No
airspace consolidation. New nodular density within the left lower
lung adjacent to the left heart border measures 1.1 cm. The
visualized skeletal structures are unremarkable.
IMPRESSION: 1. Pulmonary vascular congestion.
2. New nodular density within the left lower lung measures 1.1 cm.
Consider further evaluation with CT of the chest.

## 2023-10-17 NOTE — Telephone Encounter (Signed)
Patient reports that Saturday he collapsed about 1030pm, felt dizzy and blacked out.  When he came to states bp was 200/110's, EMS called He had been working outside all day and did not drink enough fluid.   Says EMS said he was in Afib when they arrived; however, event appears to be a very fast SVT. I do not see AF on ILR. Will let Dr. Lalla Brothers evaluate.   In NSR at ER.  Appeared dehydrated, fluids given and notified to follow up with PCP and cardiology.   Patient is doing well and will be contacting PCP and [name] (gen card) to notify them as well. He has ER precautions moving forward.   Forwarding to Dr. Lalla Brothers if anything further on our end.  (No changes in meds, diet or health otherwise) states he has been under extra stress lately and not drinking enough.    Saturday/Sunday events on ILR:

## 2023-10-17 NOTE — Telephone Encounter (Signed)
Pt states he blacked out Saturday and wants to speak with a nurse about this. Please advise

## 2023-10-17 NOTE — Telephone Encounter (Signed)
Spoke with the patient who states that he passed out on Saturday evening. He went to the ER for evaluation. He states that he had been working outside all day and did not have much to eat or drink. He states that when EMS first arrived he was in atrial fibrillation however when he got to the hospital he was in normal sinus rhythm. He has not had any further episodes. He wanted to make sure his loop recorder did not show anything from Saturday. I will have the device team check.

## 2023-10-18 IMAGING — CT CT HEAD W/O CM
4 series · 16 of 47 positions shown, 18 images · non-contrast
Comparison: May 14, 2018

CLINICAL DATA: Syncope.

EXAM:
CT HEAD WITHOUT CONTRAST
TECHNIQUE: Contiguous axial images were obtained from the base of the skull
through the vertex without intravenous contrast.

[Series 2: head bone · axial · 0.50mm/px · z∈[-105,-73]mm · 3 of 82 slices shown]
[im 9/82  bone]
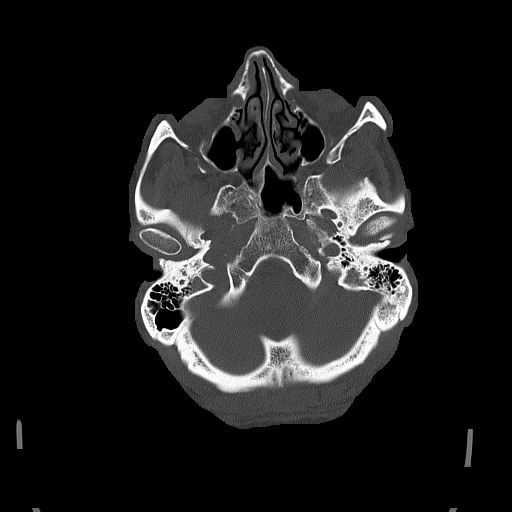
[im 17/82  bone]
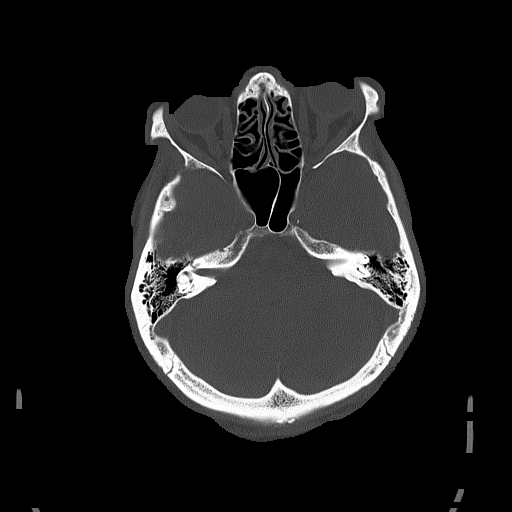
[im 25/82  bone]
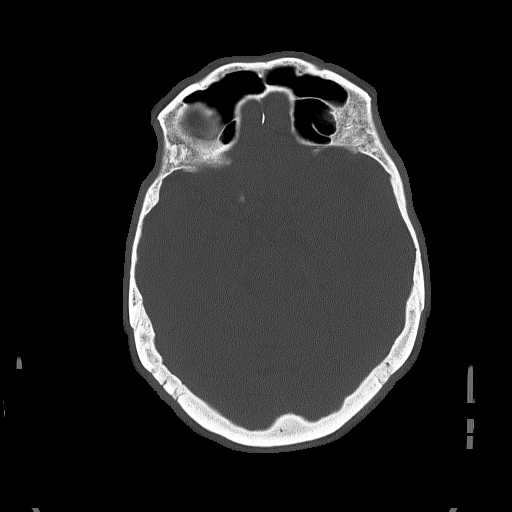

[Series 3: head wo · axial · 0.50mm/px · z∈[-101,+19]mm · 7 of 33 slices shown, 9 images]
[im 5/33  brain]
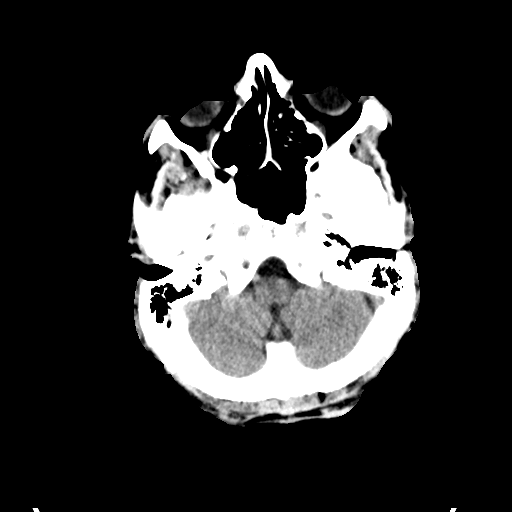
[im 5/33  bone]
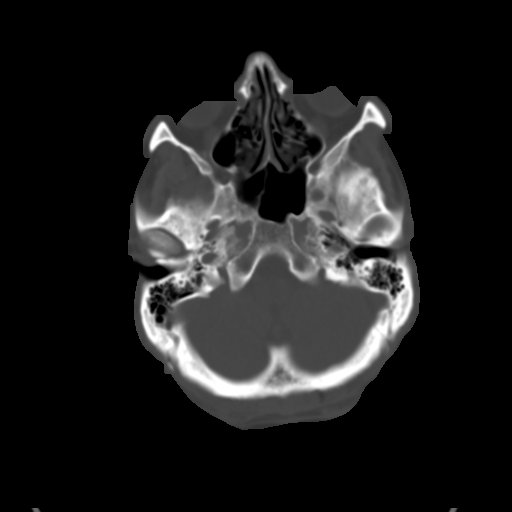
[im 9/33  brain]
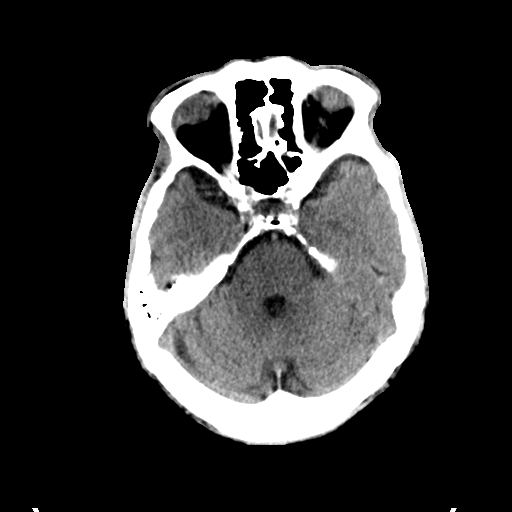
[im 13/33  brain]
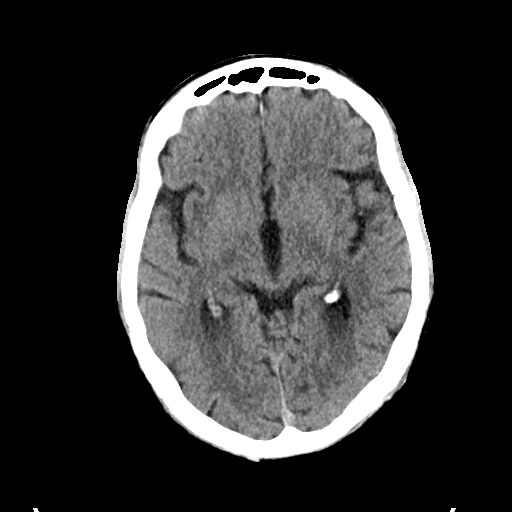
[im 17/33  brain]
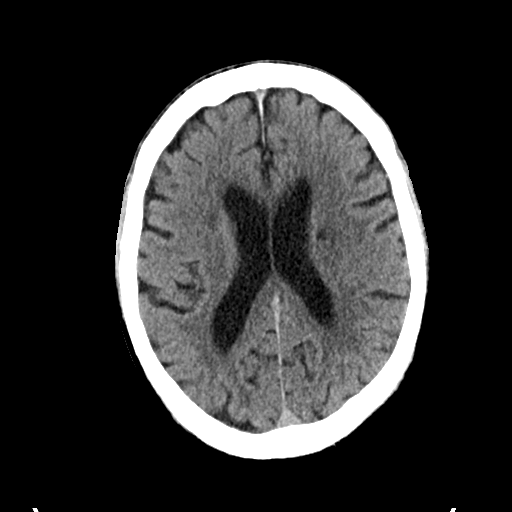
[im 21/33  brain]
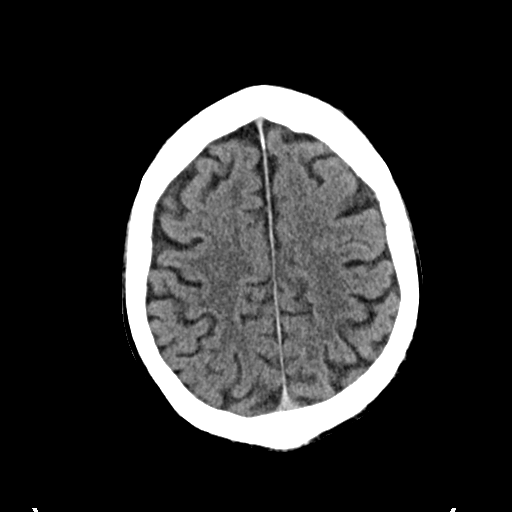
[im 21/33  bone]
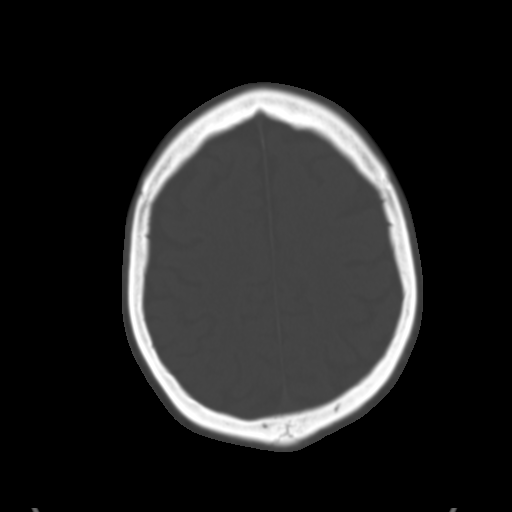
[im 25/33  brain]
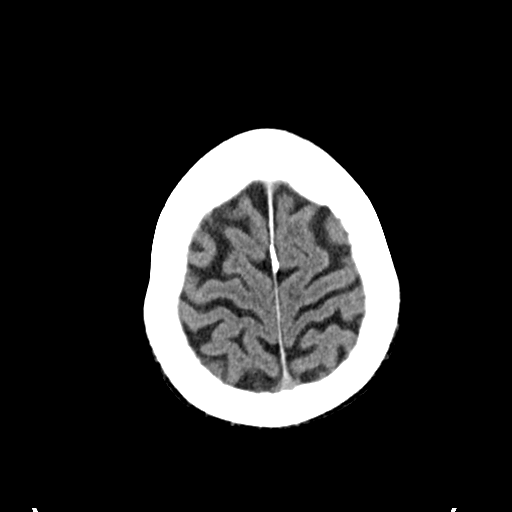
[im 29/33  brain]
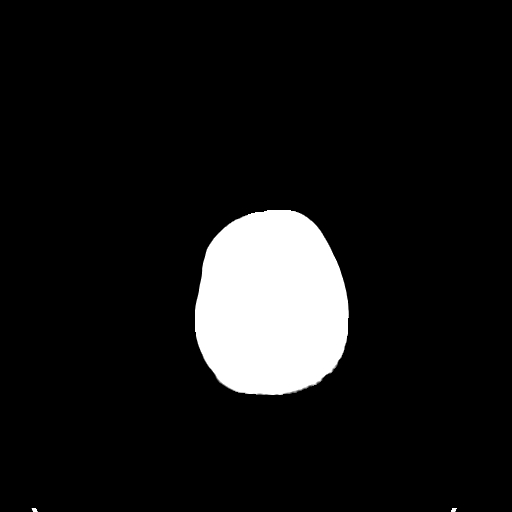

[Series 4: coronal soft tissue · coronal · 0.34mm/px · 3 of 69 slices shown]
[im 24/69  brain]
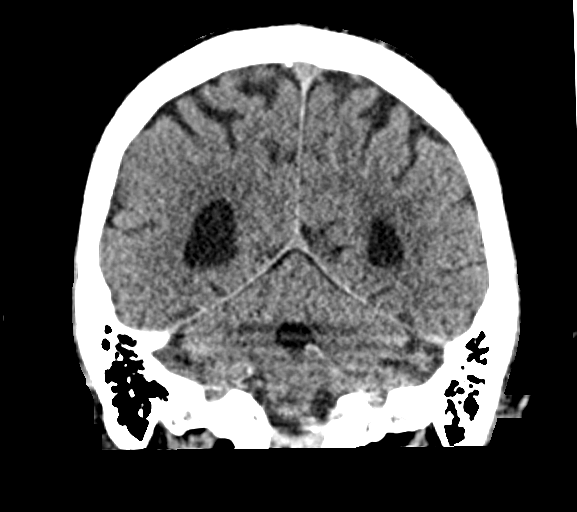
[im 31/69  brain]
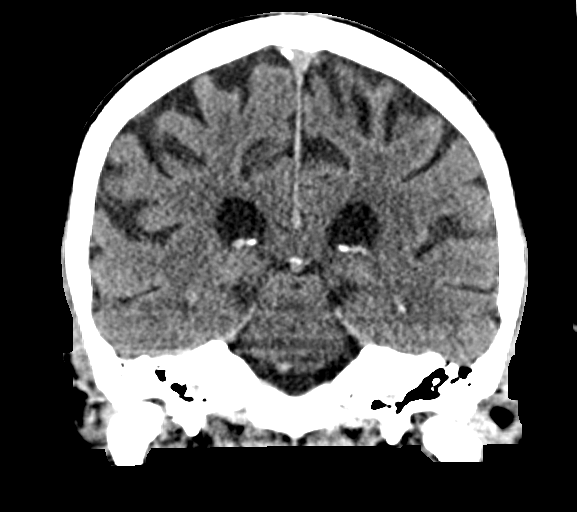
[im 38/69  brain]
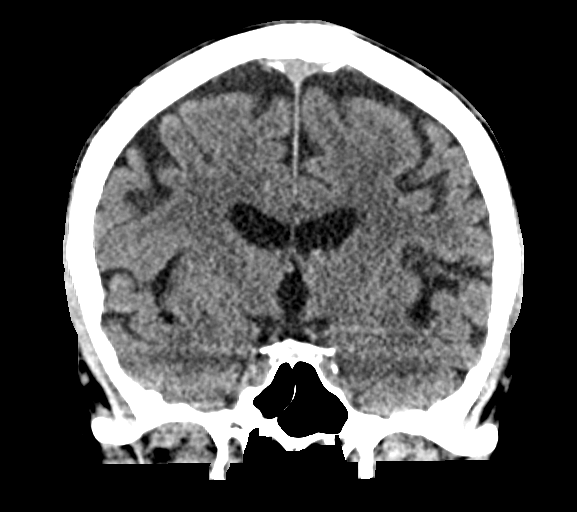

[Series 5: sagittal soft tissue · sagittal · 0.35mm/px · 3 of 65 slices shown]
[im 22/65  brain]
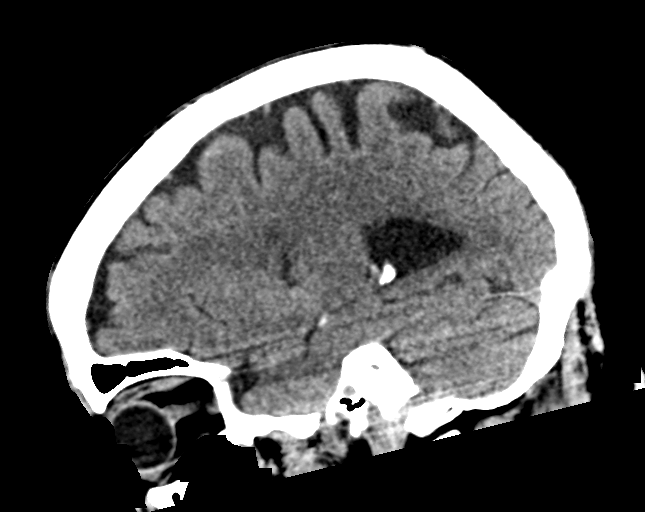
[im 33/65  brain]
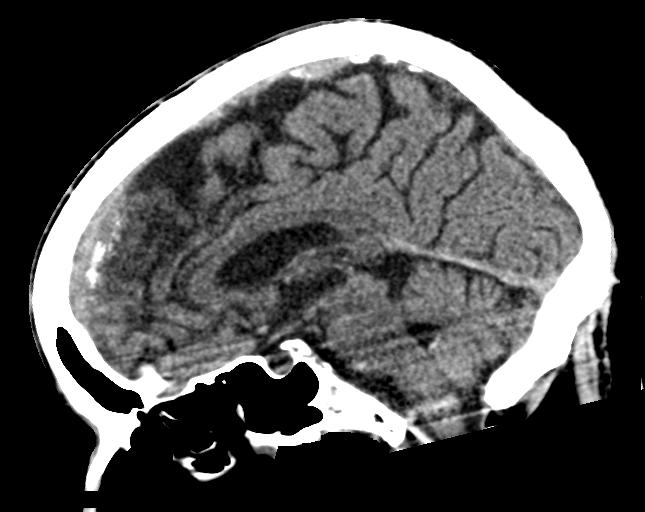
[im 43/65  brain]
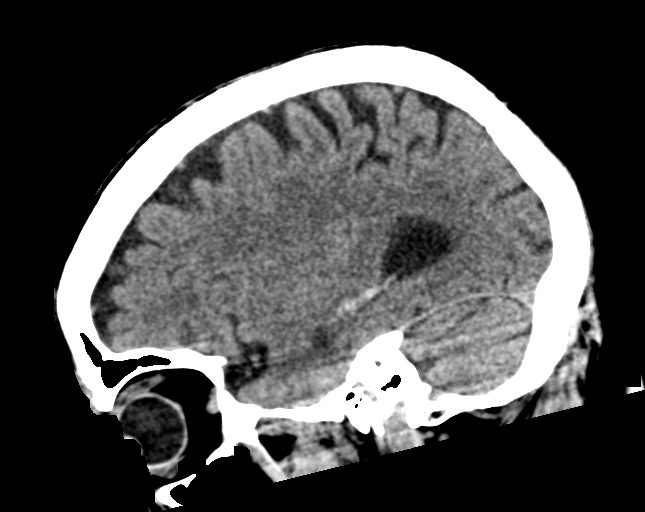

[16 of 47 positions shown; findings below may reference images not displayed]

FINDINGS: Brain: There is mild cerebral atrophy with widening of the
extra-axial spaces and ventricular dilatation.
There are areas of decreased attenuation within the white matter
tracts of the supratentorial brain, consistent with microvascular
disease changes.

A small, chronic, bilateral basal ganglia lacunar infarcts are seen.

Vascular: No hyperdense vessel or unexpected calcification.

Skull: Normal. Negative for fracture or focal lesion.

Sinuses/Orbits: No acute finding.

Other: None.
IMPRESSION: 1. Generalized cerebral atrophy.
2. No acute intracranial abnormality.

## 2023-10-18 IMAGING — CT CT ANGIO CHEST
2 of 7 series · 17 of 46 positions shown · IV contrast (APPLIED)
Comparison: September 09, 2021 and September 19, 2017

CLINICAL DATA: Syncope.

EXAM:
CT ANGIOGRAPHY CHEST WITH CONTRAST
TECHNIQUE: Multidetector CT imaging of the chest was performed using the
standard protocol during bolus administration of intravenous
contrast. Multiplanar CT image reconstructions and MIPs were
obtained to evaluate the vascular anatomy.
CONTRAST:  75mL OMNIPAQUE IOHEXOL 350 MG/ML SOLN

[Series 5: thins · axial · 0.80mm/px · z∈[-535,-284]mm · 14 of 350 slices shown]
[im 18/350  lung]
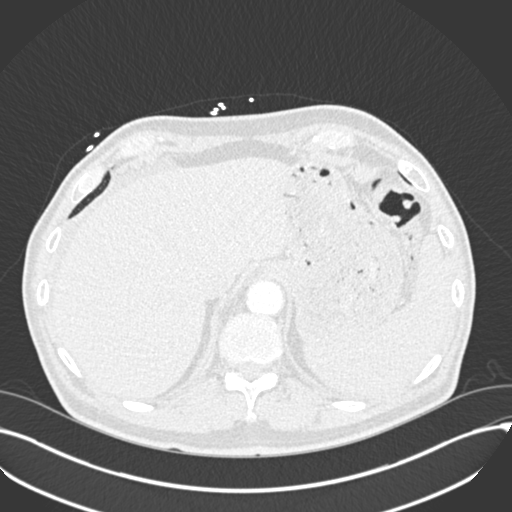
[im 53/350  soft-tissue]
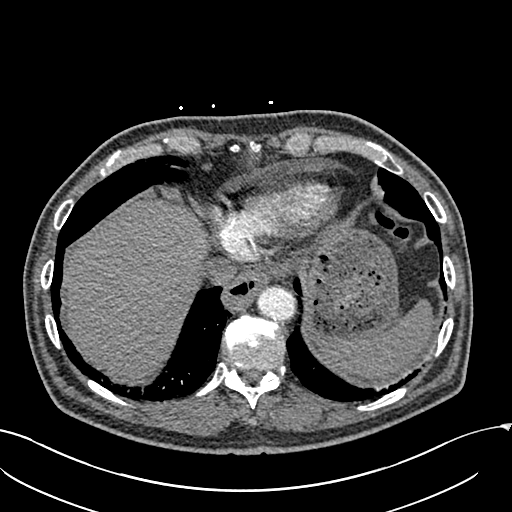
[im 70/350  lung]
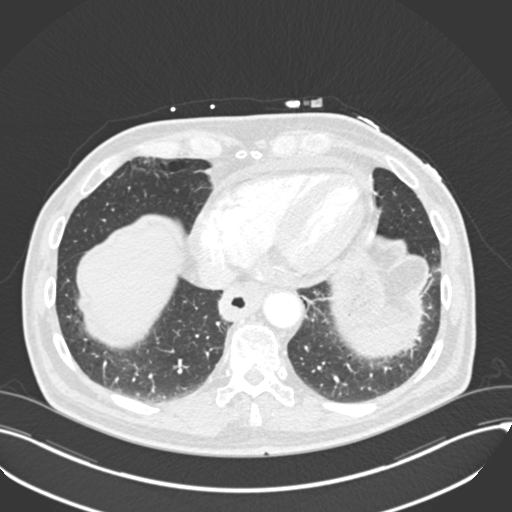
[im 88/350  soft-tissue]
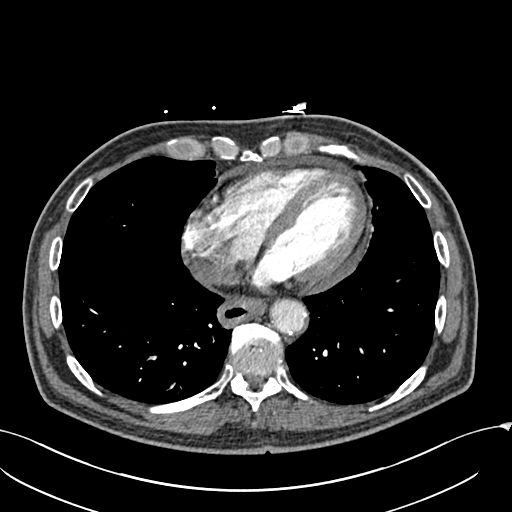
[im 123/350  lung]
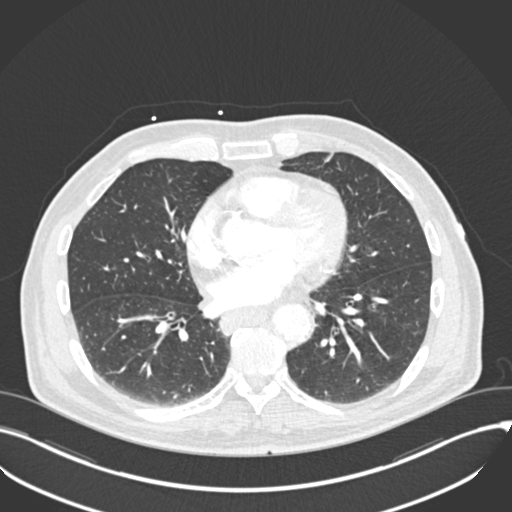
[im 140/350  soft-tissue]
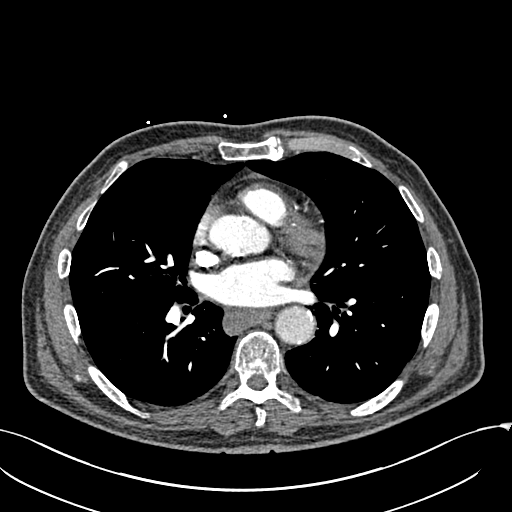
[im 158/350  lung]
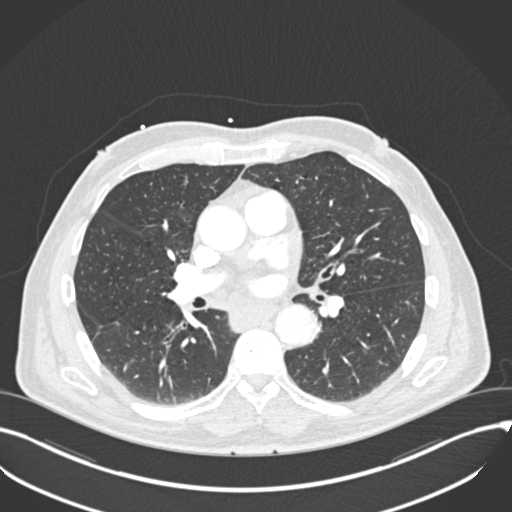
[im 192/350  soft-tissue]
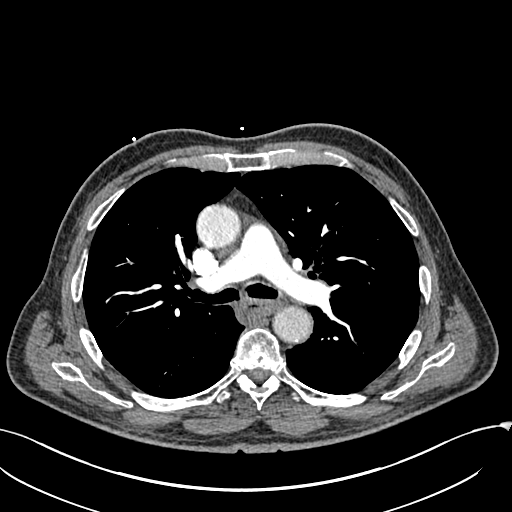
[im 210/350  lung]
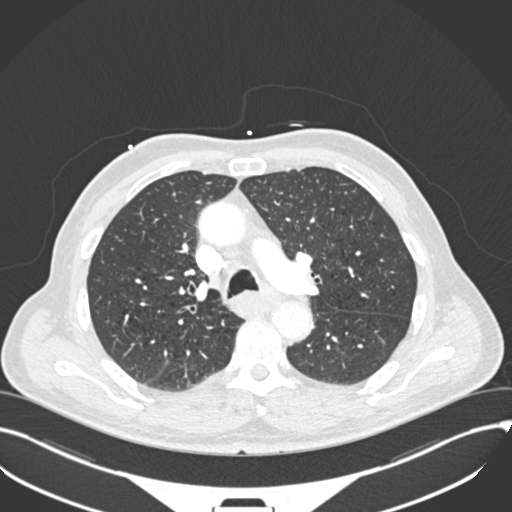
[im 227/350  soft-tissue]
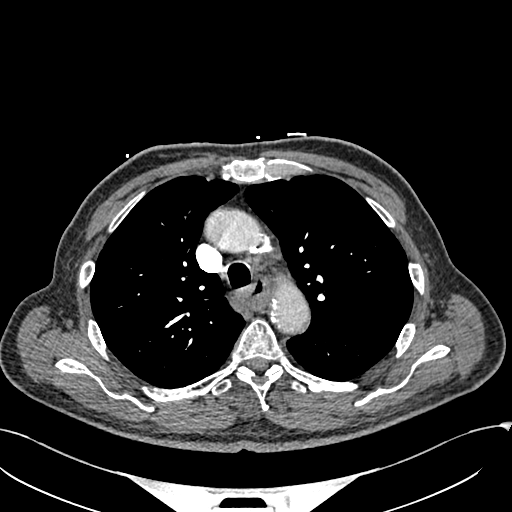
[im 262/350  lung]
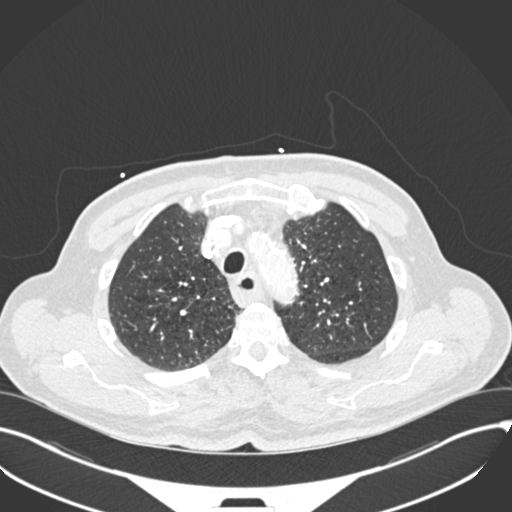
[im 280/350  soft-tissue]
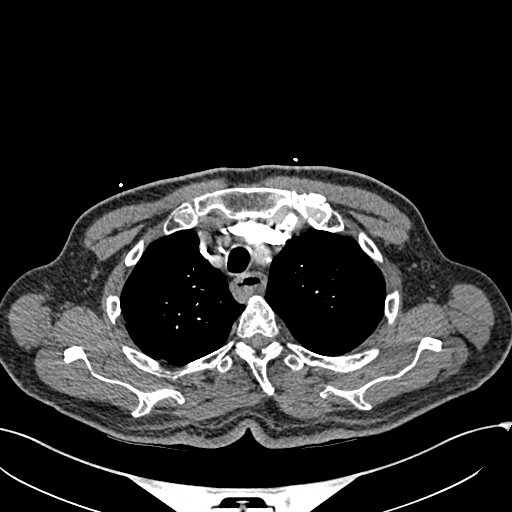
[im 297/350  lung]
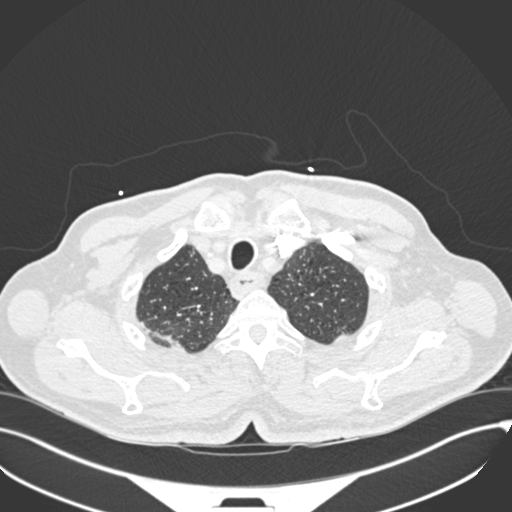
[im 332/350  soft-tissue]
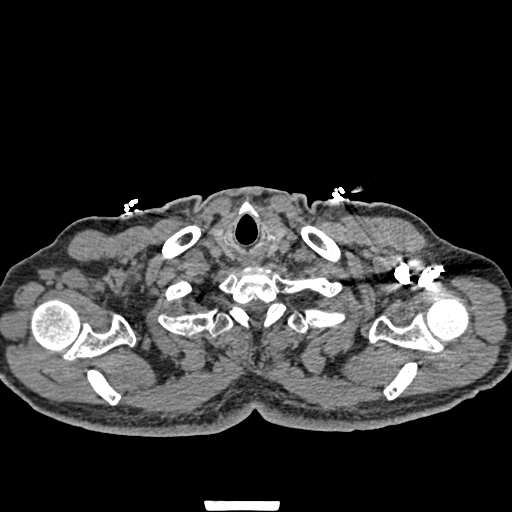

[Series 7: coronal mpr · coronal · 0.55mm/px · 3 of 84 slices shown]
[im 21/84  soft-tissue]
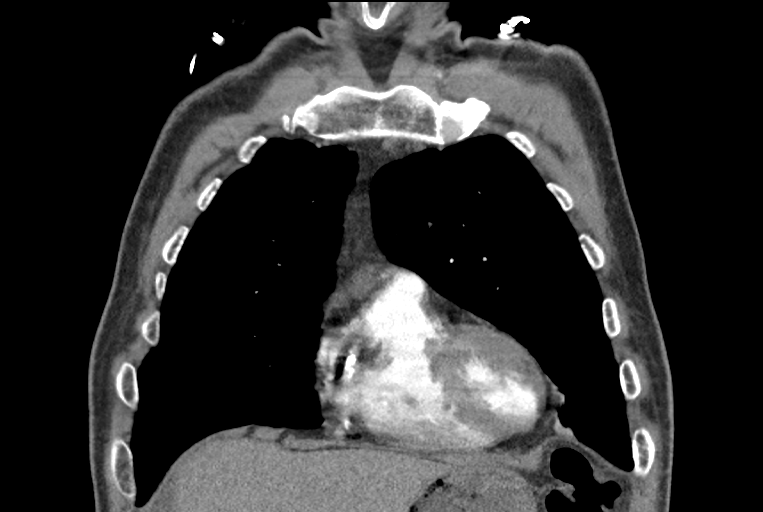
[im 42/84  soft-tissue]
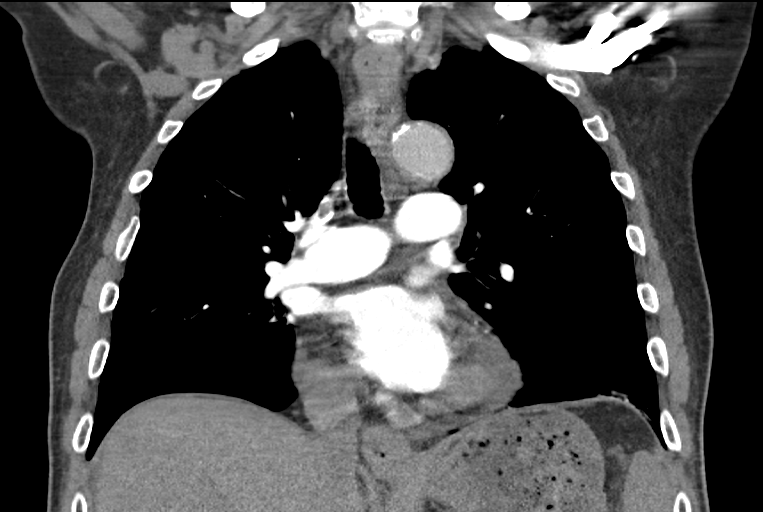
[im 63/84  soft-tissue]
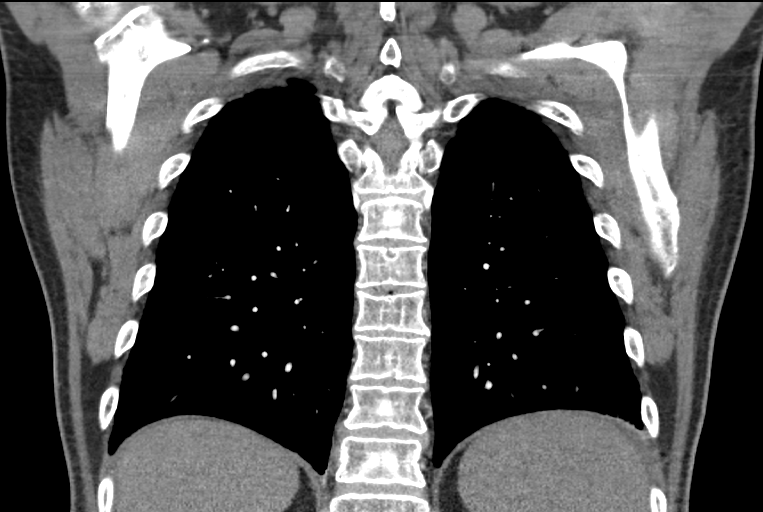

[17 of 46 positions shown; findings below may reference images not displayed]

FINDINGS: Cardiovascular: There is marked severity calcification of the
thoracic aorta, without evidence of aortic aneurysm or dissection.
Satisfactory opacification of the pulmonary arteries to the
segmental level. No evidence of pulmonary embolism. Normal heart
size with mild to moderate severity coronary artery calcification. A
small, approximately 6 mm thick pericardial effusion is noted. This
is increased in severity when compared to the prior study.

Mediastinum/Nodes: No enlarged mediastinal, hilar, or axillary lymph
nodes. The thyroid gland and trachea demonstrate no significant
findings. Marked severity diffuse esophageal wall thickening is
seen. This is predominantly stable in severity when compared to the
prior study and extends along the entire length of the esophagus.

Lungs/Pleura: Mild, stable posterior biapical and anterior left
lower lobe scarring and/or atelectasis is seen.

Mild to moderate severity emphysematous lung disease is noted.

A stable 2 mm pleural based noncalcified lung nodule is seen within
the posterolateral aspect of the right lower lobe (axial CT image
66, CT series 6).

There is no evidence of acute infiltrate, pleural effusion or
pneumothorax.

Upper Abdomen: A small amount of perihepatic fluid is noted.

Musculoskeletal: Multilevel degenerative changes are seen throughout
the thoracic spine

Review of the MIP images confirms the above findings.
IMPRESSION: 1. No evidence of pulmonary embolism.
2. Small, pericardial effusion, increased in severity when compared
to the prior study.
3. Mild to moderate severity emphysematous lung disease.
4. Stable, benign 2 mm right lower lobe noncalcified lung nodule.
5. Chronic marked severity diffuse esophageal wall thickening.
6. Small amount of perihepatic fluid.

Aortic Atherosclerosis (QBLEJ-VQO.O) and Emphysema (QBLEJ-TH0.D).

## 2023-10-19 ENCOUNTER — Ambulatory Visit (INDEPENDENT_AMBULATORY_CARE_PROVIDER_SITE_OTHER): Payer: PPO

## 2023-10-19 DIAGNOSIS — R55 Syncope and collapse: Secondary | ICD-10-CM

## 2023-10-19 LAB — CUP PACEART REMOTE DEVICE CHECK
Date Time Interrogation Session: 20241031001600
Implantable Pulse Generator Implant Date: 20230531
Pulse Gen Serial Number: 183770

## 2023-10-23 NOTE — Telephone Encounter (Signed)
Patient is returning RN's call. Please advise. 

## 2023-10-23 NOTE — Telephone Encounter (Signed)
Left message for patient to call back  

## 2023-10-23 NOTE — Telephone Encounter (Signed)
Patient is returning call. Please advise? 

## 2023-10-23 NOTE — Telephone Encounter (Signed)
Spoke with the patient and scheduled him for an appointment on 12/2.

## 2023-10-25 DIAGNOSIS — G4733 Obstructive sleep apnea (adult) (pediatric): Secondary | ICD-10-CM | POA: Diagnosis not present

## 2023-10-25 DIAGNOSIS — R400 Somnolence: Secondary | ICD-10-CM | POA: Diagnosis not present

## 2023-10-31 DIAGNOSIS — E119 Type 2 diabetes mellitus without complications: Secondary | ICD-10-CM | POA: Diagnosis not present

## 2023-10-31 DIAGNOSIS — M79675 Pain in left toe(s): Secondary | ICD-10-CM | POA: Diagnosis not present

## 2023-10-31 DIAGNOSIS — S92515D Nondisplaced fracture of proximal phalanx of left lesser toe(s), subsequent encounter for fracture with routine healing: Secondary | ICD-10-CM | POA: Diagnosis not present

## 2023-11-01 NOTE — Progress Notes (Signed)
Carelink Summary Report / Loop Recorder 

## 2023-11-14 DIAGNOSIS — E119 Type 2 diabetes mellitus without complications: Secondary | ICD-10-CM | POA: Diagnosis not present

## 2023-11-14 DIAGNOSIS — M79671 Pain in right foot: Secondary | ICD-10-CM | POA: Diagnosis not present

## 2023-11-14 DIAGNOSIS — M79675 Pain in left toe(s): Secondary | ICD-10-CM | POA: Diagnosis not present

## 2023-11-14 DIAGNOSIS — S92515D Nondisplaced fracture of proximal phalanx of left lesser toe(s), subsequent encounter for fracture with routine healing: Secondary | ICD-10-CM | POA: Diagnosis not present

## 2023-11-14 DIAGNOSIS — M79672 Pain in left foot: Secondary | ICD-10-CM | POA: Diagnosis not present

## 2023-11-19 NOTE — Progress Notes (Unsigned)
Electrophysiology Office Follow up Visit Note:    Date:  11/20/2023   ID:  Bryan Strong, DOB August 11, 1946, MRN [id number]  PCP:  Kandyce Rud, MD  [facility] Cardiologist:  None  [facility] Electrophysiologist:  Lanier Prude, MD    Interval History:     Bryan Strong is a 77 y.o. male who presents for a follow up visit.   I have previously seen the patient for an evaluation of his SVT history.  The patient has a loop recorder in place which is captured multiple episodes of a rapid regular rhythm.  He recently contacted the office with an episode of syncope after working in the yard.  His loop recorder detected an episode of supraventricular tachycardia with a ventricular rate of around 200 bpm.  In the past we discussed EP study with possible ablation as a more definitive management strategy for his recurrent episodes of tachycardia.  Discussed the use of AI scribe software for clinical note transcription with the patient, who gave verbal consent to proceed.  History of Present Illness   The patient, with a history of diabetes, hypertension, and a loop recorder for cardiac monitoring, presents for follow-up after a syncopal episode on October 26th. He was working on [employer], a task that required him to be on his knees and belly, and he did not hydrate as regularly as he usually does due to the nature of the work. Afterward, he helped his brother install a dishwasher, which took longer than expected, and he did not eat until 9 PM. While talking to his wife in the kitchen after eating, he blacked out and fell, breaking a toe. He was taken to the ER, where he was monitored until 4 AM the next day. During this time, he had a brief episode of tachycardia, which resolved by the time he arrived at the hospital. His blood pressure was high after the fall, but it was attributed to the stress of the event. The ER team suspected dehydration and delayed eating as the cause of the  syncope.  The patient also reports that he stopped taking Actos in August due to side effects of leg swelling, shortness of breath, and fatigue. He was switched to [place], which he has been tolerating well, despite initial concerns about potential side effects and cost. He has also been experiencing vertigo for the past few days, which he manages with the Epley maneuver. He has noticed that sleeping on his right side seems to trigger the vertigo.            Past medical, surgical, social and family history were reviewed.  ROS:   Please see the history of present illness.    All other systems reviewed and are negative.  EKGs/Labs/Other Studies Reviewed:    The following studies were reviewed today:          Physical Exam:    VS:  BP (!) 152/80 (BP Location: Left Arm, Patient Position: Sitting, Cuff Size: Large)   Pulse (!) 57   Ht 5\' 9"  (1.753 m)   Wt 193 lb (87.5 kg)   SpO2 95%   BMI 28.50 kg/m     Wt Readings from Last 3 Encounters:  11/20/23 193 lb (87.5 kg)  10/14/23 190 lb (86.2 kg)  08/09/23 196 lb 14.4 oz (89.3 kg)     Elderly male in no distress Cardiac: Regular rate and rhythm, no murmurs Respiratory: No increased work of breathing, clear to auscultation bilaterally  ASSESSMENT:    1. Syncope and collapse   2. Paroxysmal SVT (supraventricular tachycardia) (HCC)   3. Essential hypertension    PLAN:    In order of problems listed above:  Assessment and Plan           #Supraventricular tachycardia #Wide-complex tachycardia Overall low burden.  Continue metoprolol.  If were to become more frequent in the future, consider EP study with ablation but would like to avoid invasive procedures if at all possible.  #Syncope I suspect his syncope was related to volume depletion rather than arrhythmic in origin.  I have recommended he continue to stay hydrated and eat regular meals at home.   #Hypertension Above goal today.  Brings in recordings  from home which show blood pressures are consistently well-controlled in his home environment.  Recommend checking blood pressures 1-2 times per week at home and recording the values.  Recommend bringing these recordings to the primary care physician.     Follow-up 6 months with APP.     Signed, Steffanie Dunn, MD, [facility], [facility] 11/20/2023 11:30 AM    Electrophysiology [facility]

## 2023-11-20 ENCOUNTER — Encounter: Payer: Self-pay | Admitting: Cardiology

## 2023-11-20 ENCOUNTER — Ambulatory Visit: Payer: PPO | Attending: Cardiology | Admitting: Cardiology

## 2023-11-20 VITALS — BP 152/80 | HR 57 | Ht 69.0 in | Wt 193.0 lb

## 2023-11-20 DIAGNOSIS — I10 Essential (primary) hypertension: Secondary | ICD-10-CM

## 2023-11-20 DIAGNOSIS — R55 Syncope and collapse: Secondary | ICD-10-CM

## 2023-11-20 DIAGNOSIS — I471 Supraventricular tachycardia, unspecified: Secondary | ICD-10-CM

## 2023-11-20 NOTE — Patient Instructions (Signed)
 Medication Instructions:  Your physician recommends that you continue on your current medications as directed. Please refer to the Current Medication list given to you today.  *If you need a refill on your cardiac medications before your next appointment, please call your pharmacy*  Follow-Up: At [facility], you and your health needs are our priority.  As part of our continuing mission to provide you with exceptional heart care, we have created designated Provider Care Teams.  These Care Teams include your primary Cardiologist (physician) and Advanced Practice Providers (APPs -  Physician Assistants and Nurse Practitioners) who all work together to provide you with the care you need, when you need it.  Your next appointment:   6 month(s)  Provider:   You will see one of the following Advanced Practice Providers on your designated Care Team:   Francis Dowse, Charlott Holler [address] [place], [place] Sherie Don, NP Canary Brim, NP

## 2023-11-21 DIAGNOSIS — E785 Hyperlipidemia, unspecified: Secondary | ICD-10-CM | POA: Diagnosis not present

## 2023-11-21 DIAGNOSIS — Z79899 Other long term (current) drug therapy: Secondary | ICD-10-CM | POA: Diagnosis not present

## 2023-11-21 DIAGNOSIS — I152 Hypertension secondary to endocrine disorders: Secondary | ICD-10-CM | POA: Diagnosis not present

## 2023-11-21 DIAGNOSIS — E1159 Type 2 diabetes mellitus with other circulatory complications: Secondary | ICD-10-CM | POA: Diagnosis not present

## 2023-11-21 DIAGNOSIS — E1169 Type 2 diabetes mellitus with other specified complication: Secondary | ICD-10-CM | POA: Diagnosis not present

## 2023-11-23 ENCOUNTER — Ambulatory Visit (INDEPENDENT_AMBULATORY_CARE_PROVIDER_SITE_OTHER): Payer: PPO

## 2023-11-23 DIAGNOSIS — B351 Tinea unguium: Secondary | ICD-10-CM | POA: Diagnosis not present

## 2023-11-23 DIAGNOSIS — R55 Syncope and collapse: Secondary | ICD-10-CM

## 2023-11-23 DIAGNOSIS — M79671 Pain in right foot: Secondary | ICD-10-CM | POA: Diagnosis not present

## 2023-11-23 DIAGNOSIS — M79672 Pain in left foot: Secondary | ICD-10-CM | POA: Diagnosis not present

## 2023-11-24 DIAGNOSIS — G4733 Obstructive sleep apnea (adult) (pediatric): Secondary | ICD-10-CM | POA: Diagnosis not present

## 2023-11-24 DIAGNOSIS — R400 Somnolence: Secondary | ICD-10-CM | POA: Diagnosis not present

## 2023-11-24 LAB — CUP PACEART REMOTE DEVICE CHECK
Date Time Interrogation Session: 20241202002000
Implantable Pulse Generator Implant Date: 20230531
Pulse Gen Serial Number: 183770

## 2023-11-28 DIAGNOSIS — E785 Hyperlipidemia, unspecified: Secondary | ICD-10-CM | POA: Diagnosis not present

## 2023-11-28 DIAGNOSIS — I152 Hypertension secondary to endocrine disorders: Secondary | ICD-10-CM | POA: Diagnosis not present

## 2023-11-28 DIAGNOSIS — Z79899 Other long term (current) drug therapy: Secondary | ICD-10-CM | POA: Diagnosis not present

## 2023-11-28 DIAGNOSIS — I739 Peripheral vascular disease, unspecified: Secondary | ICD-10-CM | POA: Diagnosis not present

## 2023-11-28 DIAGNOSIS — I700 Atherosclerosis of aorta: Secondary | ICD-10-CM | POA: Diagnosis not present

## 2023-11-28 DIAGNOSIS — D696 Thrombocytopenia, unspecified: Secondary | ICD-10-CM | POA: Diagnosis not present

## 2023-11-28 DIAGNOSIS — E1169 Type 2 diabetes mellitus with other specified complication: Secondary | ICD-10-CM | POA: Diagnosis not present

## 2023-11-28 DIAGNOSIS — I714 Abdominal aortic aneurysm, without rupture, unspecified: Secondary | ICD-10-CM | POA: Diagnosis not present

## 2023-11-28 DIAGNOSIS — J449 Chronic obstructive pulmonary disease, unspecified: Secondary | ICD-10-CM | POA: Diagnosis not present

## 2023-11-28 DIAGNOSIS — E1159 Type 2 diabetes mellitus with other circulatory complications: Secondary | ICD-10-CM | POA: Diagnosis not present

## 2023-11-28 DIAGNOSIS — I480 Paroxysmal atrial fibrillation: Secondary | ICD-10-CM | POA: Diagnosis not present

## 2023-11-28 DIAGNOSIS — I2510 Atherosclerotic heart disease of native coronary artery without angina pectoris: Secondary | ICD-10-CM | POA: Diagnosis not present

## 2023-11-30 DIAGNOSIS — E119 Type 2 diabetes mellitus without complications: Secondary | ICD-10-CM | POA: Diagnosis not present

## 2023-11-30 DIAGNOSIS — I4729 Other ventricular tachycardia: Secondary | ICD-10-CM | POA: Diagnosis not present

## 2023-11-30 DIAGNOSIS — K219 Gastro-esophageal reflux disease without esophagitis: Secondary | ICD-10-CM | POA: Diagnosis not present

## 2023-11-30 DIAGNOSIS — I2510 Atherosclerotic heart disease of native coronary artery without angina pectoris: Secondary | ICD-10-CM | POA: Diagnosis not present

## 2023-11-30 DIAGNOSIS — C9000 Multiple myeloma not having achieved remission: Secondary | ICD-10-CM | POA: Diagnosis not present

## 2023-11-30 DIAGNOSIS — I2089 Other forms of angina pectoris: Secondary | ICD-10-CM | POA: Diagnosis not present

## 2023-11-30 DIAGNOSIS — I10 Essential (primary) hypertension: Secondary | ICD-10-CM | POA: Diagnosis not present

## 2023-11-30 DIAGNOSIS — I739 Peripheral vascular disease, unspecified: Secondary | ICD-10-CM | POA: Diagnosis not present

## 2023-11-30 DIAGNOSIS — I471 Supraventricular tachycardia, unspecified: Secondary | ICD-10-CM | POA: Diagnosis not present

## 2023-11-30 DIAGNOSIS — J439 Emphysema, unspecified: Secondary | ICD-10-CM | POA: Diagnosis not present

## 2023-11-30 DIAGNOSIS — G4733 Obstructive sleep apnea (adult) (pediatric): Secondary | ICD-10-CM | POA: Diagnosis not present

## 2023-12-20 DIAGNOSIS — G4733 Obstructive sleep apnea (adult) (pediatric): Secondary | ICD-10-CM | POA: Diagnosis not present

## 2023-12-20 DIAGNOSIS — R400 Somnolence: Secondary | ICD-10-CM | POA: Diagnosis not present

## 2023-12-25 DIAGNOSIS — R400 Somnolence: Secondary | ICD-10-CM | POA: Diagnosis not present

## 2023-12-25 DIAGNOSIS — G4733 Obstructive sleep apnea (adult) (pediatric): Secondary | ICD-10-CM | POA: Diagnosis not present

## 2023-12-25 LAB — CUP PACEART REMOTE DEVICE CHECK
Date Time Interrogation Session: 20250105012400
Implantable Pulse Generator Implant Date: 20230531
Pulse Gen Serial Number: 183770

## 2023-12-26 ENCOUNTER — Ambulatory Visit (INDEPENDENT_AMBULATORY_CARE_PROVIDER_SITE_OTHER): Payer: PPO

## 2023-12-26 DIAGNOSIS — R55 Syncope and collapse: Secondary | ICD-10-CM | POA: Diagnosis not present

## 2024-01-25 DIAGNOSIS — R400 Somnolence: Secondary | ICD-10-CM | POA: Diagnosis not present

## 2024-01-25 DIAGNOSIS — G4733 Obstructive sleep apnea (adult) (pediatric): Secondary | ICD-10-CM | POA: Diagnosis not present

## 2024-01-29 ENCOUNTER — Ambulatory Visit (INDEPENDENT_AMBULATORY_CARE_PROVIDER_SITE_OTHER): Payer: PPO

## 2024-01-29 DIAGNOSIS — H2513 Age-related nuclear cataract, bilateral: Secondary | ICD-10-CM | POA: Diagnosis not present

## 2024-01-29 DIAGNOSIS — Z7984 Long term (current) use of oral hypoglycemic drugs: Secondary | ICD-10-CM | POA: Diagnosis not present

## 2024-01-29 DIAGNOSIS — R55 Syncope and collapse: Secondary | ICD-10-CM | POA: Diagnosis not present

## 2024-01-29 DIAGNOSIS — E119 Type 2 diabetes mellitus without complications: Secondary | ICD-10-CM | POA: Diagnosis not present

## 2024-01-29 LAB — CUP PACEART REMOTE DEVICE CHECK
Date Time Interrogation Session: 20250210003000
Implantable Pulse Generator Implant Date: 20230531
Pulse Gen Serial Number: 183770

## 2024-01-30 ENCOUNTER — Inpatient Hospital Stay: Payer: PPO | Attending: Radiation Oncology

## 2024-01-30 DIAGNOSIS — C61 Malignant neoplasm of prostate: Secondary | ICD-10-CM

## 2024-01-30 DIAGNOSIS — Z8546 Personal history of malignant neoplasm of prostate: Secondary | ICD-10-CM | POA: Insufficient documentation

## 2024-01-31 ENCOUNTER — Other Ambulatory Visit: Payer: Self-pay | Admitting: Cardiology

## 2024-01-31 LAB — PSA: Prostatic Specific Antigen: 0.29 ng/mL (ref 0.00–4.00)

## 2024-01-31 NOTE — Telephone Encounter (Signed)
Please advise on refill request.  Thanks.

## 2024-02-03 ENCOUNTER — Encounter: Payer: Self-pay | Admitting: Cardiology

## 2024-02-06 NOTE — Progress Notes (Signed)
 Bsx Loop Recorder

## 2024-02-08 ENCOUNTER — Ambulatory Visit: Payer: PPO | Admitting: Radiation Oncology

## 2024-02-19 ENCOUNTER — Ambulatory Visit: Payer: PPO | Attending: Radiation Oncology | Admitting: Radiation Oncology

## 2024-02-19 ENCOUNTER — Telehealth: Payer: Self-pay | Admitting: Radiation Oncology

## 2024-02-19 NOTE — Telephone Encounter (Signed)
 Hello,   Pt left Vm to reschedule is missed appt.

## 2024-02-22 DIAGNOSIS — R400 Somnolence: Secondary | ICD-10-CM | POA: Diagnosis not present

## 2024-02-22 DIAGNOSIS — G4733 Obstructive sleep apnea (adult) (pediatric): Secondary | ICD-10-CM | POA: Diagnosis not present

## 2024-02-27 DIAGNOSIS — M79672 Pain in left foot: Secondary | ICD-10-CM | POA: Diagnosis not present

## 2024-02-27 DIAGNOSIS — M79671 Pain in right foot: Secondary | ICD-10-CM | POA: Diagnosis not present

## 2024-02-27 DIAGNOSIS — B351 Tinea unguium: Secondary | ICD-10-CM | POA: Diagnosis not present

## 2024-02-27 DIAGNOSIS — E119 Type 2 diabetes mellitus without complications: Secondary | ICD-10-CM | POA: Diagnosis not present

## 2024-03-04 ENCOUNTER — Ambulatory Visit (INDEPENDENT_AMBULATORY_CARE_PROVIDER_SITE_OTHER): Payer: PPO

## 2024-03-04 DIAGNOSIS — R55 Syncope and collapse: Secondary | ICD-10-CM | POA: Diagnosis not present

## 2024-03-04 LAB — CUP PACEART REMOTE DEVICE CHECK
Date Time Interrogation Session: 20250317003400
Implantable Pulse Generator Implant Date: 20230531
Pulse Gen Serial Number: 183770

## 2024-03-05 ENCOUNTER — Encounter: Payer: Self-pay | Admitting: Cardiology

## 2024-03-07 NOTE — Progress Notes (Signed)
 [place] [employer]

## 2024-03-24 DIAGNOSIS — R400 Somnolence: Secondary | ICD-10-CM | POA: Diagnosis not present

## 2024-03-24 DIAGNOSIS — G4733 Obstructive sleep apnea (adult) (pediatric): Secondary | ICD-10-CM | POA: Diagnosis not present

## 2024-03-27 ENCOUNTER — Ambulatory Visit: Admitting: Radiation Oncology

## 2024-03-27 DIAGNOSIS — C61 Malignant neoplasm of prostate: Secondary | ICD-10-CM | POA: Insufficient documentation

## 2024-03-28 ENCOUNTER — Other Ambulatory Visit: Payer: Self-pay | Admitting: *Deleted

## 2024-03-28 ENCOUNTER — Ambulatory Visit
Admission: RE | Admit: 2024-03-28 | Discharge: 2024-03-28 | Disposition: A | Discharge status: Home / Self Care | Source: Ambulatory Visit | Attending: Radiation Oncology | Admitting: Radiation Oncology

## 2024-03-28 ENCOUNTER — Encounter: Payer: Self-pay | Admitting: Radiation Oncology

## 2024-03-28 VITALS — BP 130/76 | HR 55 | Temp 98.8°F | Wt 186.0 lb

## 2024-03-28 DIAGNOSIS — C61 Malignant neoplasm of prostate: Secondary | ICD-10-CM | POA: Diagnosis not present

## 2024-03-28 DIAGNOSIS — Z191 Hormone sensitive malignancy status: Secondary | ICD-10-CM | POA: Diagnosis not present

## 2024-03-28 NOTE — Progress Notes (Signed)
 Radiation Oncology Follow up Note  Name: Bryan Strong   Date:   03/28/2024 MRN:  [id number] DOB: 31-Oct-1946    This 78 y.o. male presents to the clinic today for 1-1/2-year follow-up status post IMRT radiation therapy for Gleason 7 (4+3) adenocarcinoma presenting with a PSA of 9  REFERRING PROVIDER: Kandyce Rud, MD  HPI: Patient is a 78 year old male now out over a year and a half having completed IMRT radiation therapy for Gleason 7 adenocarcinoma.  Seen today in routine follow-up he is doing well low side effect profile specifically denies frequency urgency or diarrhea.  His most recent PSA back in February.  Was 0.29 continue a downward trend.  COMPLICATIONS OF TREATMENT: none  FOLLOW UP COMPLIANCE: keeps appointments   PHYSICAL EXAM:  BP 130/76   Pulse (!) 55   Temp 98.8 F (37.1 C) (Tympanic)   Wt 186 lb (84.4 kg)   BMI 27.47 kg/m  Well-developed well-nourished patient in NAD. HEENT reveals PERLA, EOMI, discs not visualized.  Oral cavity is clear. No oral mucosal lesions are identified. Neck is clear without evidence of cervical or supraclavicular adenopathy. Lungs are clear to A&P. Cardiac examination is essentially unremarkable with regular rate and rhythm without murmur rub or thrill. Abdomen is benign with no organomegaly or masses noted. Motor sensory and DTR levels are equal and symmetric in the upper and lower extremities. Cranial nerves II through XII are grossly intact. Proprioception is intact. No peripheral adenopathy or edema is identified. No motor or sensory levels are noted. Crude visual fields are within normal range.  RADIOLOGY RESULTS: No current films for review  PLAN: The present time patient is doing well under excellent biochemical control of his prostate cancer of asked to see him back in 6 months and we will start once year follow-up visits.  Patient knows to call with any concerns.  I would like to take this opportunity to thank you for allowing me to  participate in the care of your patient.Carmina Miller, MD

## 2024-04-08 ENCOUNTER — Ambulatory Visit (INDEPENDENT_AMBULATORY_CARE_PROVIDER_SITE_OTHER): Payer: PPO

## 2024-04-08 DIAGNOSIS — R55 Syncope and collapse: Secondary | ICD-10-CM

## 2024-04-09 LAB — CUP PACEART REMOTE DEVICE CHECK
Date Time Interrogation Session: 20250421003900
Implantable Pulse Generator Implant Date: 20230531
Pulse Gen Serial Number: 183770

## 2024-04-10 ENCOUNTER — Encounter: Payer: Self-pay | Admitting: Cardiology

## 2024-04-22 NOTE — Progress Notes (Signed)
 Bsx Loop Recorder

## 2024-04-23 DIAGNOSIS — G4733 Obstructive sleep apnea (adult) (pediatric): Secondary | ICD-10-CM | POA: Diagnosis not present

## 2024-04-23 DIAGNOSIS — R400 Somnolence: Secondary | ICD-10-CM | POA: Diagnosis not present

## 2024-05-07 ENCOUNTER — Encounter (INDEPENDENT_AMBULATORY_CARE_PROVIDER_SITE_OTHER): Payer: Self-pay

## 2024-05-09 ENCOUNTER — Ambulatory Visit (INDEPENDENT_AMBULATORY_CARE_PROVIDER_SITE_OTHER)

## 2024-05-09 ENCOUNTER — Ambulatory Visit: Payer: Self-pay | Admitting: Cardiology

## 2024-05-09 DIAGNOSIS — R55 Syncope and collapse: Secondary | ICD-10-CM

## 2024-05-09 LAB — CUP PACEART REMOTE DEVICE CHECK
Date Time Interrogation Session: 20250522014300
Implantable Pulse Generator Implant Date: 20230531
Pulse Gen Serial Number: 183770

## 2024-05-23 DIAGNOSIS — J449 Chronic obstructive pulmonary disease, unspecified: Secondary | ICD-10-CM | POA: Diagnosis not present

## 2024-05-23 DIAGNOSIS — J439 Emphysema, unspecified: Secondary | ICD-10-CM | POA: Diagnosis not present

## 2024-05-23 DIAGNOSIS — I10 Essential (primary) hypertension: Secondary | ICD-10-CM | POA: Diagnosis not present

## 2024-05-23 DIAGNOSIS — I480 Paroxysmal atrial fibrillation: Secondary | ICD-10-CM | POA: Diagnosis not present

## 2024-05-23 DIAGNOSIS — E119 Type 2 diabetes mellitus without complications: Secondary | ICD-10-CM | POA: Diagnosis not present

## 2024-05-23 DIAGNOSIS — I2510 Atherosclerotic heart disease of native coronary artery without angina pectoris: Secondary | ICD-10-CM | POA: Diagnosis not present

## 2024-05-23 DIAGNOSIS — G4733 Obstructive sleep apnea (adult) (pediatric): Secondary | ICD-10-CM | POA: Diagnosis not present

## 2024-05-23 DIAGNOSIS — I4729 Other ventricular tachycardia: Secondary | ICD-10-CM | POA: Diagnosis not present

## 2024-05-23 DIAGNOSIS — I739 Peripheral vascular disease, unspecified: Secondary | ICD-10-CM | POA: Diagnosis not present

## 2024-05-23 DIAGNOSIS — K219 Gastro-esophageal reflux disease without esophagitis: Secondary | ICD-10-CM | POA: Diagnosis not present

## 2024-05-23 DIAGNOSIS — Z1331 Encounter for screening for depression: Secondary | ICD-10-CM | POA: Diagnosis not present

## 2024-05-23 DIAGNOSIS — E7800 Pure hypercholesterolemia, unspecified: Secondary | ICD-10-CM | POA: Diagnosis not present

## 2024-05-23 DIAGNOSIS — Z955 Presence of coronary angioplasty implant and graft: Secondary | ICD-10-CM | POA: Diagnosis not present

## 2024-05-28 DIAGNOSIS — E785 Hyperlipidemia, unspecified: Secondary | ICD-10-CM | POA: Diagnosis not present

## 2024-05-28 DIAGNOSIS — E1169 Type 2 diabetes mellitus with other specified complication: Secondary | ICD-10-CM | POA: Diagnosis not present

## 2024-05-28 DIAGNOSIS — I152 Hypertension secondary to endocrine disorders: Secondary | ICD-10-CM | POA: Diagnosis not present

## 2024-05-28 DIAGNOSIS — E1159 Type 2 diabetes mellitus with other circulatory complications: Secondary | ICD-10-CM | POA: Diagnosis not present

## 2024-05-28 DIAGNOSIS — Z79899 Other long term (current) drug therapy: Secondary | ICD-10-CM | POA: Diagnosis not present

## 2024-05-28 NOTE — Progress Notes (Signed)
 Bsx Loop Recorder

## 2024-05-28 NOTE — Addendum Note (Signed)
 Addended by: Edra Govern D on: 05/28/2024 04:41 PM   Modules accepted: Orders

## 2024-06-04 DIAGNOSIS — C61 Malignant neoplasm of prostate: Secondary | ICD-10-CM | POA: Diagnosis not present

## 2024-06-04 DIAGNOSIS — J449 Chronic obstructive pulmonary disease, unspecified: Secondary | ICD-10-CM | POA: Diagnosis not present

## 2024-06-04 DIAGNOSIS — Z0000 Encounter for general adult medical examination without abnormal findings: Secondary | ICD-10-CM | POA: Diagnosis not present

## 2024-06-04 DIAGNOSIS — I739 Peripheral vascular disease, unspecified: Secondary | ICD-10-CM | POA: Diagnosis not present

## 2024-06-04 DIAGNOSIS — I2510 Atherosclerotic heart disease of native coronary artery without angina pectoris: Secondary | ICD-10-CM | POA: Diagnosis not present

## 2024-06-04 DIAGNOSIS — E1169 Type 2 diabetes mellitus with other specified complication: Secondary | ICD-10-CM | POA: Diagnosis not present

## 2024-06-04 DIAGNOSIS — Z1331 Encounter for screening for depression: Secondary | ICD-10-CM | POA: Diagnosis not present

## 2024-06-04 DIAGNOSIS — D696 Thrombocytopenia, unspecified: Secondary | ICD-10-CM | POA: Diagnosis not present

## 2024-06-04 DIAGNOSIS — I480 Paroxysmal atrial fibrillation: Secondary | ICD-10-CM | POA: Diagnosis not present

## 2024-06-04 DIAGNOSIS — E1159 Type 2 diabetes mellitus with other circulatory complications: Secondary | ICD-10-CM | POA: Diagnosis not present

## 2024-06-04 DIAGNOSIS — I700 Atherosclerosis of aorta: Secondary | ICD-10-CM | POA: Diagnosis not present

## 2024-06-04 DIAGNOSIS — I714 Abdominal aortic aneurysm, without rupture, unspecified: Secondary | ICD-10-CM | POA: Diagnosis not present

## 2024-06-07 DIAGNOSIS — D2261 Melanocytic nevi of right upper limb, including shoulder: Secondary | ICD-10-CM | POA: Diagnosis not present

## 2024-06-07 DIAGNOSIS — D485 Neoplasm of uncertain behavior of skin: Secondary | ICD-10-CM | POA: Diagnosis not present

## 2024-06-07 DIAGNOSIS — D2271 Melanocytic nevi of right lower limb, including hip: Secondary | ICD-10-CM | POA: Diagnosis not present

## 2024-06-07 DIAGNOSIS — L821 Other seborrheic keratosis: Secondary | ICD-10-CM | POA: Diagnosis not present

## 2024-06-07 DIAGNOSIS — C4441 Basal cell carcinoma of skin of scalp and neck: Secondary | ICD-10-CM | POA: Diagnosis not present

## 2024-06-07 DIAGNOSIS — D2272 Melanocytic nevi of left lower limb, including hip: Secondary | ICD-10-CM | POA: Diagnosis not present

## 2024-06-07 DIAGNOSIS — D2262 Melanocytic nevi of left upper limb, including shoulder: Secondary | ICD-10-CM | POA: Diagnosis not present

## 2024-06-07 DIAGNOSIS — D225 Melanocytic nevi of trunk: Secondary | ICD-10-CM | POA: Diagnosis not present

## 2024-06-10 ENCOUNTER — Ambulatory Visit (INDEPENDENT_AMBULATORY_CARE_PROVIDER_SITE_OTHER)

## 2024-06-10 DIAGNOSIS — R55 Syncope and collapse: Secondary | ICD-10-CM

## 2024-06-10 LAB — CUP PACEART REMOTE DEVICE CHECK
Date Time Interrogation Session: 20250623014800
Implantable Pulse Generator Implant Date: 20230531
Pulse Gen Serial Number: 183770

## 2024-06-11 ENCOUNTER — Ambulatory Visit: Payer: Self-pay | Admitting: Cardiology

## 2024-06-11 DIAGNOSIS — M79672 Pain in left foot: Secondary | ICD-10-CM | POA: Diagnosis not present

## 2024-06-11 DIAGNOSIS — E119 Type 2 diabetes mellitus without complications: Secondary | ICD-10-CM | POA: Diagnosis not present

## 2024-06-11 DIAGNOSIS — M79671 Pain in right foot: Secondary | ICD-10-CM | POA: Diagnosis not present

## 2024-06-11 DIAGNOSIS — B351 Tinea unguium: Secondary | ICD-10-CM | POA: Diagnosis not present

## 2024-06-17 DIAGNOSIS — L720 Epidermal cyst: Secondary | ICD-10-CM | POA: Diagnosis not present

## 2024-06-17 DIAGNOSIS — R208 Other disturbances of skin sensation: Secondary | ICD-10-CM | POA: Diagnosis not present

## 2024-06-18 NOTE — Progress Notes (Signed)
 Carelink Summary Report / Loop Recorder

## 2024-07-02 DIAGNOSIS — Z03818 Encounter for observation for suspected exposure to other biological agents ruled out: Secondary | ICD-10-CM | POA: Diagnosis not present

## 2024-07-02 DIAGNOSIS — J069 Acute upper respiratory infection, unspecified: Secondary | ICD-10-CM | POA: Diagnosis not present

## 2024-07-11 ENCOUNTER — Other Ambulatory Visit (INDEPENDENT_AMBULATORY_CARE_PROVIDER_SITE_OTHER): Payer: PPO

## 2024-07-11 ENCOUNTER — Ambulatory Visit

## 2024-07-11 ENCOUNTER — Ambulatory Visit: Payer: Self-pay | Admitting: Cardiology

## 2024-07-11 ENCOUNTER — Ambulatory Visit (INDEPENDENT_AMBULATORY_CARE_PROVIDER_SITE_OTHER): Payer: PPO | Admitting: Vascular Surgery

## 2024-07-11 DIAGNOSIS — R55 Syncope and collapse: Secondary | ICD-10-CM

## 2024-07-11 LAB — CUP PACEART REMOTE DEVICE CHECK
Date Time Interrogation Session: 20250724035300
Implantable Pulse Generator Implant Date: 20230531
Pulse Gen Serial Number: 183770

## 2024-07-15 NOTE — Progress Notes (Signed)
Bsx loop recorder 

## 2024-07-22 DIAGNOSIS — E119 Type 2 diabetes mellitus without complications: Secondary | ICD-10-CM | POA: Diagnosis not present

## 2024-07-22 DIAGNOSIS — H2513 Age-related nuclear cataract, bilateral: Secondary | ICD-10-CM | POA: Diagnosis not present

## 2024-07-22 DIAGNOSIS — Z7984 Long term (current) use of oral hypoglycemic drugs: Secondary | ICD-10-CM | POA: Diagnosis not present

## 2024-08-06 ENCOUNTER — Other Ambulatory Visit (INDEPENDENT_AMBULATORY_CARE_PROVIDER_SITE_OTHER): Payer: Self-pay | Admitting: Vascular Surgery

## 2024-08-06 DIAGNOSIS — I7143 Infrarenal abdominal aortic aneurysm, without rupture: Secondary | ICD-10-CM

## 2024-08-06 NOTE — Progress Notes (Unsigned)
 MRN : [id number]  Bryan Strong is a 78 y.o. (06-20-1946) male who presents with chief complaint of check circulation.  History of Present Illness:   The patient returns to the office for surveillance of a known abdominal aortic aneurysm. Patient denies abdominal pain or back pain, no other abdominal complaints. No changes suggesting embolic episodes.   There have been no interval changes in the patient's overall health care since his last visit.   Patient denies amaurosis fugax or TIA symptoms. There is no history of claudication or rest pain symptoms of the lower extremities. The patient denies angina or shortness of breath.   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  No outpatient medications have been marked as taking for the 08/08/24 encounter (Appointment) with Jama, Cordella MATSU, MD.    Past Medical History:  Diagnosis Date   A-fib [facility])    Achalasia of esophagus    Allergic rhinitis    Anemia    Anginal pain (HCC)    Aortic aneurysm (HCC)    followed by Dr. Franz   Coronary artery disease    Diabetes mellitus without complication (HCC)    Dyspnea    Emphysema of lung (HCC)    GERD (gastroesophageal reflux disease)    Heart murmur    Hypercholesteremia    Hypertension    Kidney stones    Skin cancer    Sleep apnea    CPAP   Wears dentures    full upper and lower    Past Surgical History:  Procedure Laterality Date   CHOLECYSTECTOMY     colon polyps     COLONOSCOPY WITH PROPOFOL  N/A 09/18/2017   Procedure: COLONOSCOPY WITH PROPOFOL ;  Surgeon: Viktoria Lamar DASEN, MD;  Location: [facility] ENDOSCOPY;  Service: Endoscopy;  Laterality: N/A;   CORONARY ANGIOPLASTY WITH STENT PLACEMENT  01/03/11   [facility]  Dr. Ammon   ECTROPION  REPAIR Bilateral 05/03/2016   Procedure: REPAIR OF ECTROPION BILATERAL LOWER EYELIDS;  Surgeon: Greig CHRISTELLA Gay, MD;  Location: [facility] SURGERY CNTR;  Service: Ophthalmology;  Laterality: Bilateral;   ESOPHAGOGASTRODUODENOSCOPY (EGD) WITH PROPOFOL  N/A 07/21/2017   Procedure: ESOPHAGOGASTRODUODENOSCOPY (EGD) WITH PROPOFOL ;  Surgeon: Viktoria Lamar DASEN, MD;  Location: [facility] ENDOSCOPY;  Service: Endoscopy;  Laterality: N/A;   ESOPHAGOGASTRODUODENOSCOPY (EGD) WITH PROPOFOL  Bilateral 10/18/2017   Procedure: ESOPHAGOGASTRODUODENOSCOPY (EGD) WITH PROPOFOL ;  Surgeon: Viktoria Lamar DASEN, MD;  Location: [facility] ENDOSCOPY;  Service: Endoscopy;  Laterality: Bilateral;   HERNIA REPAIR     LEFT HEART CATH Left 06/08/2017   Procedure: Left Heart Cath and Coronary Angiography;  Surgeon: Florencio Cara BIRCH, MD;  Location: [facility] INVASIVE CV LAB;  Service: Cardiovascular;  Laterality: Left;   LEFT HEART CATH AND CORONARY ANGIOGRAPHY N/A 08/26/2022   Procedure: LEFT HEART CATH AND CORONARY ANGIOGRAPHY;  Surgeon: Dann Candyce RAMAN, MD;  Location: [facility] INVASIVE CV LAB;  Service: Cardiovascular;  Laterality: N/A;   LID LESION EXCISION Bilateral 05/03/2016  Procedure: ,EXCISION CYST RIGHT UPPER EYELID;  Surgeon: Greig CHRISTELLA Gay, MD;  Location: [facility] SURGERY [facility]TR;  Service: Ophthalmology;  Laterality: Bilateral;  Diabetic - oral meds CPAP   NASAL SEPTUM SURGERY  1967   SAVORY DILATION N/A 07/21/2017   Procedure: SAVORY DILATION;  Surgeon: Viktoria Lamar DASEN, MD;  Location: [facility] ENDOSCOPY;  Service: Endoscopy;  Laterality: N/A;    Social History Social History   Tobacco Use   Smoking status: Former    Current packs/day: 0.00    Average packs/day: 1 pack/day for 56.0 years (56.0 ttl pk-yrs)    Types: Cigarettes    Start date: 11/04/1959    Quit date: 11/04/2015    Years since quitting: 8.7   Smokeless tobacco: Never  Vaping Use   Vaping status: Never Used  Substance Use Topics   Alcohol use: No   Drug use: No    Family  History Family History  Problem Relation Age of Onset   Diabetes Brother    Diabetes Maternal Aunt    Diabetes Maternal Uncle    Diabetes Maternal Grandmother     Allergies  Allergen Reactions   Erythromycin  Base Rash   Other Rash    ERYTHROMYCIN  OINTMENT some nerve pill, caused HTN     REVIEW OF SYSTEMS (Negative unless checked)  Constitutional: [] Weight loss  [] Fever  [] Chills Cardiac: [] Chest pain   [] Chest pressure   [] Palpitations   [] Shortness of breath when laying flat   [] Shortness of breath with exertion. Vascular:  [x] Pain in legs with walking   [] Pain in legs at rest  [] History of DVT   [] Phlebitis   [] Swelling in legs   [] Varicose veins   [] Non-healing ulcers Pulmonary:   [] Uses home oxygen   [] Productive cough   [] Hemoptysis   [] Wheeze  [] COPD   [] Asthma Neurologic:  [] Dizziness   [] Seizures   [] History of stroke   [] History of TIA  [] Aphasia   [] Vissual changes   [] Weakness or numbness in arm   [] Weakness or numbness in leg Musculoskeletal:   [] Joint swelling   [] Joint pain   [] Low back pain Hematologic:  [] Easy bruising  [] Easy bleeding   [] Hypercoagulable state   [] Anemic Gastrointestinal:  [] Diarrhea   [] Vomiting  [] Gastroesophageal reflux/heartburn   [] Difficulty swallowing. Genitourinary:  [] Chronic kidney disease   [] Difficult urination  [] Frequent urination   [] Blood in urine Skin:  [] Rashes   [] Ulcers  Psychological:  [] History of anxiety   []  History of major depression.  Physical Examination  There were no vitals filed for this visit. There is no height or weight on file to calculate BMI. Gen: WD/WN, NAD Head: [place]/AT, No temporalis wasting.  Ear/Nose/Throat: Hearing grossly intact, nares w/o erythema or drainage Eyes: PER, EOMI, sclera nonicteric.  Neck: Supple, no masses.  No bruit or JVD.  Pulmonary:  Good air movement, no audible wheezing, no use of accessory muscles.  Cardiac: RRR, normal S1, S2, no Murmurs. Vascular:  mild trophic changes, no  open wounds Vessel Right Left  Radial Palpable Palpable  PT Palpable Palpable  DP Palpable Palpable  Gastrointestinal: soft, non-distended. No guarding/no peritoneal signs.  Musculoskeletal: M/S 5/5 throughout.  No visible deformity.  Neurologic: CN 2-12 intact. Pain and light touch intact in extremities.  Symmetrical.  Speech is fluent. Motor exam as listed above. Psychiatric: Judgment intact, Mood & affect appropriate for pt's clinical situation. Dermatologic: No rashes or ulcers noted.  No changes consistent with cellulitis.   CBC Lab Results  Component Value Date  WBC 4.1 10/14/2023   HGB 13.0 10/14/2023   HCT 38.2 (L) 10/14/2023   MCV 94.6 10/14/2023   PLT 138 (L) 10/14/2023    BMET    Component Value Date/Time   NA 137 10/14/2023 2347   NA 140 01/11/2013 2241   K 3.8 10/14/2023 2347   K 3.7 01/11/2013 2241   CL 105 10/14/2023 2347   CL 105 01/11/2013 2241   CO2 24 10/14/2023 2347   CO2 29 01/11/2013 2241   GLUCOSE 282 (H) 10/14/2023 2347   GLUCOSE 219 (H) 01/11/2013 2241   BUN 20 10/14/2023 2347   BUN 14 01/11/2013 2241   CREATININE 0.99 10/14/2023 2347   CREATININE 1.06 01/11/2013 2241   CALCIUM 9.2 10/14/2023 2347   CALCIUM 9.4 01/11/2013 2241   GFRNONAA >60 10/14/2023 2347   GFRNONAA >60 01/11/2013 2241   GFRAA >60 06/11/2020 1244   GFRAA >60 01/11/2013 2241   CrCl cannot be calculated (Patient's most recent lab result is older than the maximum 21 days allowed.).  COAG No results found for: INR, PROTIME  Radiology CUP PACEART REMOTE DEVICE CHECK Result Date: 07/11/2024 ILR summary report received. Battery status OK. Normal device function. No new symptom, tachy, brady, or pause episodes. No new AF episodes. Monthly summary reports and ROV/PRN AB, CVRS    Assessment/Plan 1. Infrarenal abdominal aortic aneurysm (AAA) without rupture (HCC) (Primary) Recommend: No surgery or intervention is indicated at this time.   The patient has an  asymptomatic abdominal aortic aneurysm that is less than 4 cm in maximal diameter.     I have reviewed the natural history of abdominal aortic aneurysm and the small risk of rupture for aneurysm less than 5 cm in size.   However, as these small aneurysms tend to enlarge over time, continued surveillance with ultrasound or CT scan is mandatory.    I have also discussed optimizing medical management with hypertension and lipid control and the importance of abstinence from tobacco.  The patient is also encouraged to exercise a minimum of 30 minutes 4 times a week.    Should the patient develop new onset abdominal or back pain or signs of peripheral embolization they are instructed to seek medical attention immediately and to alert the physician providing care that they have an aneurysm.    The patient voices their understanding.   The patient will return in 12 months with an aortic duplex.  - VAS US  AORTA/IVC/ILIACS; Future  2. Aortic atherosclerosis (HCC) Recommend:   I do not find evidence of life style limiting vascular disease. The patient specifically denies life style limitation.   Previous noninvasive studies including ABI's of the legs do not identify critical vascular problems.   The patient should continue walking and begin a more formal exercise program. The patient should continue his antiplatelet therapy and aggressive treatment of the lipid abnormalities.   The patient is instructed to call the office if there is a significant change in the lower extremity symptoms, particularly if a wound develops or there is an abrupt increase in leg pain.  3. Coronary artery disease of native artery of native heart with stable angina pectoris (HCC) Continue cardiac and antihypertensive medications as already ordered and reviewed, no changes at this time.  Continue statin as ordered and reviewed, no changes at this time  Nitrates PRN for chest pain  4. Hypertension associated with  diabetes (HCC) Continue antihypertensive medications as already ordered, these medications have been reviewed and there are no changes at this  time.  5. Type 2 diabetes mellitus without complication, unspecified whether long term insulin  use (HCC) Continue hypoglycemic medications as already ordered, these medications have been reviewed and there are no changes at this time.  Hgb A1C to be monitored as already arranged by primary service  6. Hyperlipidemia associated with type 2 diabetes mellitus (HCC) Continue statin as ordered and reviewed, no changes at this time    Cordella Shawl, MD  08/06/2024 4:30 PM

## 2024-08-08 ENCOUNTER — Ambulatory Visit (INDEPENDENT_AMBULATORY_CARE_PROVIDER_SITE_OTHER): Admitting: Vascular Surgery

## 2024-08-08 ENCOUNTER — Encounter (INDEPENDENT_AMBULATORY_CARE_PROVIDER_SITE_OTHER): Payer: Self-pay | Admitting: Vascular Surgery

## 2024-08-08 ENCOUNTER — Other Ambulatory Visit (INDEPENDENT_AMBULATORY_CARE_PROVIDER_SITE_OTHER)

## 2024-08-08 VITALS — BP 124/72 | HR 71 | Ht 69.0 in | Wt 185.0 lb

## 2024-08-08 DIAGNOSIS — E785 Hyperlipidemia, unspecified: Secondary | ICD-10-CM | POA: Diagnosis not present

## 2024-08-08 DIAGNOSIS — I7143 Infrarenal abdominal aortic aneurysm, without rupture: Secondary | ICD-10-CM

## 2024-08-08 DIAGNOSIS — I25118 Atherosclerotic heart disease of native coronary artery with other forms of angina pectoris: Secondary | ICD-10-CM

## 2024-08-08 DIAGNOSIS — E1169 Type 2 diabetes mellitus with other specified complication: Secondary | ICD-10-CM

## 2024-08-08 DIAGNOSIS — E1159 Type 2 diabetes mellitus with other circulatory complications: Secondary | ICD-10-CM

## 2024-08-08 DIAGNOSIS — I152 Hypertension secondary to endocrine disorders: Secondary | ICD-10-CM | POA: Diagnosis not present

## 2024-08-08 DIAGNOSIS — I700 Atherosclerosis of aorta: Secondary | ICD-10-CM

## 2024-08-08 DIAGNOSIS — E119 Type 2 diabetes mellitus without complications: Secondary | ICD-10-CM | POA: Diagnosis not present

## 2024-08-09 ENCOUNTER — Encounter (INDEPENDENT_AMBULATORY_CARE_PROVIDER_SITE_OTHER): Payer: Self-pay | Admitting: Vascular Surgery

## 2024-08-12 ENCOUNTER — Ambulatory Visit

## 2024-08-12 DIAGNOSIS — R55 Syncope and collapse: Secondary | ICD-10-CM | POA: Diagnosis not present

## 2024-08-13 LAB — CUP PACEART REMOTE DEVICE CHECK
Date Time Interrogation Session: 20250825015800
Implantable Pulse Generator Implant Date: 20230531
Pulse Gen Serial Number: 183770

## 2024-08-14 ENCOUNTER — Ambulatory Visit: Payer: Self-pay | Admitting: Cardiology

## 2024-09-04 NOTE — Progress Notes (Signed)
 Remote Loop Recorder Transmission

## 2024-09-12 ENCOUNTER — Ambulatory Visit

## 2024-09-12 DIAGNOSIS — Z85828 Personal history of other malignant neoplasm of skin: Secondary | ICD-10-CM | POA: Diagnosis not present

## 2024-09-12 DIAGNOSIS — R55 Syncope and collapse: Secondary | ICD-10-CM | POA: Diagnosis not present

## 2024-09-12 DIAGNOSIS — L578 Other skin changes due to chronic exposure to nonionizing radiation: Secondary | ICD-10-CM | POA: Diagnosis not present

## 2024-09-12 DIAGNOSIS — C441122 Basal cell carcinoma of skin of right lower eyelid, including canthus: Secondary | ICD-10-CM | POA: Diagnosis not present

## 2024-09-12 LAB — CUP PACEART REMOTE DEVICE CHECK
Date Time Interrogation Session: 20250925020300
Implantable Pulse Generator Implant Date: 20230531
Pulse Gen Serial Number: 183770

## 2024-09-16 NOTE — Progress Notes (Signed)
 Remote Loop Recorder Transmission

## 2024-09-17 DIAGNOSIS — M79672 Pain in left foot: Secondary | ICD-10-CM | POA: Diagnosis not present

## 2024-09-17 DIAGNOSIS — M79671 Pain in right foot: Secondary | ICD-10-CM | POA: Diagnosis not present

## 2024-09-17 DIAGNOSIS — B351 Tinea unguium: Secondary | ICD-10-CM | POA: Diagnosis not present

## 2024-09-19 ENCOUNTER — Inpatient Hospital Stay: Attending: Radiation Oncology

## 2024-09-19 DIAGNOSIS — Z923 Personal history of irradiation: Secondary | ICD-10-CM | POA: Insufficient documentation

## 2024-09-19 DIAGNOSIS — Z8546 Personal history of malignant neoplasm of prostate: Secondary | ICD-10-CM | POA: Insufficient documentation

## 2024-09-19 DIAGNOSIS — C61 Malignant neoplasm of prostate: Secondary | ICD-10-CM

## 2024-09-19 LAB — PSA: Prostatic Specific Antigen: 0.58 ng/mL (ref 0.00–4.00)

## 2024-09-19 NOTE — Progress Notes (Signed)
 Remote Loop Recorder Transmission

## 2024-09-20 ENCOUNTER — Ambulatory Visit: Payer: Self-pay | Admitting: Cardiology

## 2024-09-26 ENCOUNTER — Other Ambulatory Visit: Payer: Self-pay | Admitting: *Deleted

## 2024-09-26 ENCOUNTER — Encounter: Payer: Self-pay | Admitting: Radiation Oncology

## 2024-09-26 ENCOUNTER — Ambulatory Visit
Admission: RE | Admit: 2024-09-26 | Discharge: 2024-09-26 | Disposition: A | Discharge status: Home / Self Care | Source: Ambulatory Visit | Attending: Radiation Oncology | Admitting: Radiation Oncology

## 2024-09-26 VITALS — BP 100/65 | HR 53 | Temp 98.0°F | Resp 16 | Ht 69.0 in | Wt 182.0 lb

## 2024-09-26 DIAGNOSIS — C61 Malignant neoplasm of prostate: Secondary | ICD-10-CM

## 2024-09-26 DIAGNOSIS — Z191 Hormone sensitive malignancy status: Secondary | ICD-10-CM | POA: Diagnosis not present

## 2024-09-26 NOTE — Progress Notes (Signed)
 Radiation Oncology Follow up Note  Name: Bryan Strong   Date:   09/26/2024 MRN:  [id number] DOB: 11/13/1946    This 78 y.o. male presents to the clinic today for 27-month follow-up status post image guided IMRT radiation therapy for Gleason 7 (4+3) adenocarcinoma the prostate presenting with a PSA of 9.  REFERRING PROVIDER: Diedra Lame, MD  HPI: Patient is an 78 year old male now out 11 months having completed image guided IMRT radiation therapy for Gleason 7 adenocarcinoma seen today in routine follow-up he is doing well.  He specifically denies any increased lower urinary tract symptoms.  Is having occasional diarrhea although he blames this on metformin which he is on for his diabetes control.  His most recent PSA was 0.58 up slightly from 0.29 back in February..  COMPLICATIONS OF TREATMENT: none  FOLLOW UP COMPLIANCE: keeps appointments   PHYSICAL EXAM:  BP 100/65   Pulse (!) 53   Temp 98 F (36.7 C)   Resp 16   Ht 5' 9 (1.753 m)   Wt 182 lb (82.6 kg)   BMI 26.88 kg/m  Well-developed well-nourished patient in NAD. HEENT reveals PERLA, EOMI, discs not visualized.  Oral cavity is clear. No oral mucosal lesions are identified. Neck is clear without evidence of cervical or supraclavicular adenopathy. Lungs are clear to A&P. Cardiac examination is essentially unremarkable with regular rate and rhythm without murmur rub or thrill. Abdomen is benign with no organomegaly or masses noted. Motor sensory and DTR levels are equal and symmetric in the upper and lower extremities. Cranial nerves II through XII are grossly intact. Proprioception is intact. No peripheral adenopathy or edema is identified. No motor or sensory levels are noted. Crude visual fields are within normal range.  RADIOLOGY RESULTS: No current films for review  PLAN: Present time patient is under excellent biochemical control of his prostate cancer.  Have asked to see him back in 6 months for follow-up with repeat PSA.   Patient knows to call sooner with any concerns.  I would like to take this opportunity to thank you for allowing me to participate in the care of your patient.SABRA Marcey Penton, MD

## 2024-10-07 ENCOUNTER — Other Ambulatory Visit: Payer: Self-pay | Admitting: Cardiology

## 2024-10-08 ENCOUNTER — Other Ambulatory Visit: Payer: Self-pay | Admitting: Radiation Oncology

## 2024-10-09 ENCOUNTER — Telehealth: Payer: Self-pay | Admitting: Cardiology

## 2024-10-09 MED ORDER — METOPROLOL TARTRATE 25 MG PO TABS: 25.0000 mg | ORAL_TABLET | Freq: Two times a day (BID) | ORAL | 0 refills | Status: DC

## 2024-10-09 NOTE — Telephone Encounter (Signed)
 RX sent in

## 2024-10-09 NOTE — Telephone Encounter (Signed)
*  STAT* If patient is at the pharmacy, call can be transferred to refill team.   1. Which medications need to be refilled? (please list name of each medication and dose if known) metoprolol  tartrate (LOPRESSOR ) 25 MG tablet    2. Would you like to learn more about the convenience, safety, & potential cost savings by using the [facility]? no   3. Are you open to using the [facility] (Type [facility].  ). no   4. Which pharmacy/location (including street and city if local pharmacy) is medication to be sent to?[facility] - [place], [place] - [address]    5. Do they need a 30 day or 90 day supply? 90 day    Pt is out of medication

## 2024-10-14 ENCOUNTER — Ambulatory Visit (INDEPENDENT_AMBULATORY_CARE_PROVIDER_SITE_OTHER)

## 2024-10-14 DIAGNOSIS — I471 Supraventricular tachycardia, unspecified: Secondary | ICD-10-CM | POA: Diagnosis not present

## 2024-10-14 LAB — CUP PACEART REMOTE DEVICE CHECK
Date Time Interrogation Session: 20251027001300
Implantable Pulse Generator Implant Date: 20230531
Pulse Gen Serial Number: 183770

## 2024-10-16 ENCOUNTER — Ambulatory Visit: Payer: Self-pay | Admitting: Cardiology

## 2024-10-21 ENCOUNTER — Other Ambulatory Visit: Payer: Self-pay | Admitting: Cardiology

## 2024-10-22 NOTE — Progress Notes (Signed)
 Remote Loop Recorder Transmission

## 2024-10-31 ENCOUNTER — Encounter: Payer: Self-pay | Admitting: Cardiology

## 2024-10-31 ENCOUNTER — Ambulatory Visit: Attending: Cardiology | Admitting: Cardiology

## 2024-10-31 VITALS — BP 110/50 | HR 53 | Ht 69.5 in | Wt 186.8 lb

## 2024-10-31 DIAGNOSIS — Z95818 Presence of other cardiac implants and grafts: Secondary | ICD-10-CM | POA: Diagnosis not present

## 2024-10-31 DIAGNOSIS — I10 Essential (primary) hypertension: Secondary | ICD-10-CM | POA: Diagnosis not present

## 2024-10-31 DIAGNOSIS — R55 Syncope and collapse: Secondary | ICD-10-CM | POA: Diagnosis not present

## 2024-10-31 DIAGNOSIS — I471 Supraventricular tachycardia, unspecified: Secondary | ICD-10-CM | POA: Diagnosis not present

## 2024-10-31 MED ORDER — METOPROLOL TARTRATE 25 MG PO TABS: 25.0000 mg | ORAL_TABLET | Freq: Two times a day (BID) | ORAL | 3 refills | Status: AC

## 2024-10-31 NOTE — Progress Notes (Signed)
 Electrophysiology Clinic Note    Date:  10/31/2024  Patient ID:  Bryan Strong, Bryan Strong 1946/01/05, MRN [id number] PCP:  Diedra Lame, MD  Cardiologist:  Cara JONETTA Lovelace, MD  Electrophysiologist:  OLE ONEIDA HOLTS, MD  Electrophysiology APP:  Karlene Southard, NP     Discussed the use of AI scribe software for clinical note transcription with the patient, who gave verbal consent to proceed.   Patient Profile    Chief Complaint: SVT, syncope follow-up  History of Present Illness: Bryan Strong is a 78 y.o. male with PMH notable for syncope s/p ILR implant, SVT, parox AFib, HTN, CAD, COPD, T2DM ; seen today for OLE ONEIDA HOLTS, MD for routine electrophysiology followup.   ILR in place to monitor SVT and for cause of syncope.  He last saw Dr. Holts 11/2023 where he had low burden of SVT/wide complex tachycardia, recommended to continue to monitor.   On follow-up today, he has not had any syncopal episodes since last being seen. He does have rare, brief LH episodes that were previously correlated with sVT episodes on ILR. These episodes are very brief (few seconds) in duration, and happen every few weeks. No chest pain, chest pressure, or presyncope with episodes.   He checks BP daily at home, most readings 110-120 systolic, rarely 869 or higher.     Arrhythmia/Device History Bos Sci ILR, imp 04/2022; dx syncope    ROS:  Please see the history of present illness. All other systems are reviewed and otherwise negative.    Physical Exam    VS:  BP (!) 110/50 (BP Location: Left Arm, Patient Position: Sitting, Cuff Size: Normal)   Pulse (!) 53   Ht 5' 9.5 (1.765 m)   Wt 186 lb 12.8 oz (84.7 kg)   SpO2 95%   BMI 27.19 kg/m  BMI: Body mass index is 27.19 kg/m.           Wt Readings from Last 3 Encounters:  10/31/24 186 lb 12.8 oz (84.7 kg)  09/26/24 182 lb (82.6 kg)  08/08/24 185 lb (83.9 kg)     GEN- The patient is well appearing, alert and oriented x 3 today.    Lungs- Clear to ausculation bilaterally, normal work of breathing.  Heart- Regular rate and rhythm, no murmurs, rubs or gallops Extremities- No peripheral edema, warm, dry   ILR remote interrogation reviewed by myself:  Battery good No recent events since 10/27 remote  Studies Reviewed   Previous EP, cardiology notes.    EKG is ordered. Personal review of EKG from today shows:    EKG Interpretation Date/Time:  Thursday October 31 2024 14:08:29 EST Ventricular Rate:  53 PR Interval:  210 QRS Duration:  88 QT Interval:  436 QTC Calculation: 409 R Axis:   -2  Text Interpretation: Sinus bradycardia with 1st degree A-V block Confirmed by Auren Valdes [phone]) on 10/31/2024 2:20:27 PM    LHC, 08/26/2022   Ost RCA lesion is 10% stenosed.  No pressure dampening with catheter engagement.   1st Mrg lesion is 50% stenosed.   Mid RCA lesion is 25% stenosed.   1st Diag lesion is 25% stenosed.   Previously placed Dist RCA stent of unknown type is  widely patent.   The left ventricular systolic function is normal.   LV end diastolic pressure is normal.   The left ventricular ejection fraction is 55-65% by visual estimate.   There is no aortic valve stenosis.  Patent distal RCA stent.  Mild, branch vessel disease in the OM and diagonal.  Both lesions appeared improved compared to 2018 catheterization.  Minimal luminal irregularities in the LAD.  No lesions to explain nonsustained ventricular tachycardia.   TTE, 08/26/2022  1. Left ventricular ejection fraction, by estimation, is 60 to 65%. The left ventricle has normal function. The left ventricle has no regional wall motion abnormalities. There is moderate concentric left ventricular hypertrophy. Left ventricular diastolic parameters are consistent with Grade I diastolic dysfunction (impaired relaxation).   2. Right ventricular systolic function is normal. The right ventricular size is normal.   3. A small pericardial effusion is present.    4. The mitral valve is normal in structure. Trivial mitral valve regurgitation. No evidence of mitral stenosis.   5. The aortic valve is tricuspid. Aortic valve regurgitation is not visualized. Aortic valve sclerosis is present, with no evidence of aortic valve stenosis.   6. Aortic dilatation noted. There is borderline dilatation of the aortic root, measuring 39 mm.   7. The inferior vena cava is normal in size with greater than 50% respiratory variability, suggesting right atrial pressure of 3 mmHg.     Assessment and Plan     #) syncope s/p ILR No recent syncopal events No significant pauses or bradycardia on ILR  #) SVT #) wide-complex tachycardia He does continue to have brief SVT episodes on ILR Continue 25mg  lopressor  BID   #) HTN Well-controlled in office today and by home measurements Continue 20mg  lisinoprol, metop as above       Current medicines are reviewed at length with the patient today.   The patient does not have concerns regarding his medicines.  The following changes were made today:  none  Labs/ tests ordered today include:  Orders Placed This Encounter  Procedures   EKG 12-Lead     Disposition: Follow up with Dr. Kennyth or EP APP in 6 months, prefer MD to establish care   Signed, Chantal Needle, NP  10/31/24  3:52 PM  Electrophysiology [facility]

## 2024-10-31 NOTE — Patient Instructions (Signed)
 Medication Instructions:  Your physician recommends that you continue on your current medications as directed. Please refer to the Current Medication list given to you today.   *If you need a refill on your cardiac medications before your next appointment, please call your pharmacy*  Lab Work: No labs ordered today  If you have labs (blood work) drawn today and your tests are completely normal, you will receive your results only by: MyChart Message (if you have MyChart) OR A paper copy in the mail If you have any lab test that is abnormal or we need to change your treatment, we will call you to review the results.  Testing/Procedures: No test ordered today   Follow-Up: At [facility], you and your health needs are our priority.  As part of our continuing mission to provide you with exceptional heart care, our providers are all part of one team.  This team includes your primary Cardiologist (physician) and Advanced Practice Providers or APPs (Physician Assistants and Nurse Practitioners) who all work together to provide you with the care you need, when you need it.  Your next appointment:   6 month(s)  Provider:   Suzann Riddle, NP or Dr. Kennyth    We recommend signing up for the patient portal called MyChart.  Sign up information is provided on this After Visit Summary.  MyChart is used to connect with patients for Virtual Visits (Telemedicine).  Patients are able to view lab/test results, encounter notes, upcoming appointments, etc.  Non-urgent messages can be sent to your provider as well.   To learn more about what you can do with MyChart, go to [email or web address].

## 2024-11-18 ENCOUNTER — Ambulatory Visit

## 2024-11-18 DIAGNOSIS — R55 Syncope and collapse: Secondary | ICD-10-CM | POA: Diagnosis not present

## 2024-11-18 LAB — CUP PACEART REMOTE DEVICE CHECK
Date Time Interrogation Session: 20251201021800
Implantable Pulse Generator Implant Date: 20230531
Pulse Gen Serial Number: 183770

## 2024-11-21 DIAGNOSIS — G4733 Obstructive sleep apnea (adult) (pediatric): Secondary | ICD-10-CM | POA: Diagnosis not present

## 2024-11-21 DIAGNOSIS — J301 Allergic rhinitis due to pollen: Secondary | ICD-10-CM | POA: Diagnosis not present

## 2024-11-21 DIAGNOSIS — K219 Gastro-esophageal reflux disease without esophagitis: Secondary | ICD-10-CM | POA: Diagnosis not present

## 2024-11-21 DIAGNOSIS — J449 Chronic obstructive pulmonary disease, unspecified: Secondary | ICD-10-CM | POA: Diagnosis not present

## 2024-11-22 NOTE — Progress Notes (Signed)
 Remote Loop Recorder Transmission

## 2024-12-05 ENCOUNTER — Ambulatory Visit: Payer: Self-pay | Admitting: Cardiology

## 2024-12-16 ENCOUNTER — Encounter

## 2024-12-19 ENCOUNTER — Ambulatory Visit: Attending: Cardiology

## 2024-12-19 DIAGNOSIS — R55 Syncope and collapse: Secondary | ICD-10-CM

## 2024-12-20 LAB — CUP PACEART REMOTE DEVICE CHECK
Date Time Interrogation Session: 20260101002300
Implantable Pulse Generator Implant Date: 20230531
Pulse Gen Serial Number: 183770

## 2024-12-21 ENCOUNTER — Ambulatory Visit: Payer: Self-pay | Admitting: Cardiology

## 2024-12-24 NOTE — Progress Notes (Signed)
 Remote Loop Recorder Transmission

## 2025-01-19 ENCOUNTER — Ambulatory Visit: Attending: Cardiology

## 2025-01-19 DIAGNOSIS — R55 Syncope and collapse: Secondary | ICD-10-CM

## 2025-01-21 ENCOUNTER — Other Ambulatory Visit: Payer: Self-pay | Admitting: Cardiology

## 2025-01-21 LAB — CUP PACEART REMOTE DEVICE CHECK
Date Time Interrogation Session: 20260201002700
Implantable Pulse Generator Implant Date: 20230531
Pulse Gen Serial Number: 183770

## 2025-01-24 MED ORDER — MAGNESIUM OXIDE 400 MG PO TABS: 1.0000 | ORAL_TABLET | Freq: Two times a day (BID) | ORAL | 3 refills | Status: AC

## 2025-01-24 NOTE — Progress Notes (Signed)
 Remote Loop Recorder Transmission

## 2025-02-19 ENCOUNTER — Ambulatory Visit

## 2025-03-22 ENCOUNTER — Ambulatory Visit

## 2025-03-27 ENCOUNTER — Other Ambulatory Visit

## 2025-04-03 ENCOUNTER — Ambulatory Visit: Admitting: Radiation Oncology

## 2025-04-22 ENCOUNTER — Ambulatory Visit

## 2025-05-23 ENCOUNTER — Ambulatory Visit

## 2025-06-23 ENCOUNTER — Ambulatory Visit

## 2025-07-24 ENCOUNTER — Ambulatory Visit

## 2025-08-07 ENCOUNTER — Encounter (INDEPENDENT_AMBULATORY_CARE_PROVIDER_SITE_OTHER)

## 2025-08-07 ENCOUNTER — Ambulatory Visit (INDEPENDENT_AMBULATORY_CARE_PROVIDER_SITE_OTHER): Admitting: Vascular Surgery

## 2025-08-24 ENCOUNTER — Ambulatory Visit

## 2025-09-24 ENCOUNTER — Ambulatory Visit
